# Patient Record
Sex: Male | Born: 1975
Health system: Southern US, Community
[De-identification: ages and names within clinical notes are randomized; demographics above are authoritative.]

## PROBLEM LIST (undated history)

## (undated) DIAGNOSIS — E7800 Pure hypercholesterolemia, unspecified: Secondary | ICD-10-CM

## (undated) DIAGNOSIS — K829 Disease of gallbladder, unspecified: Secondary | ICD-10-CM

## (undated) DIAGNOSIS — R61 Generalized hyperhidrosis: Secondary | ICD-10-CM

## (undated) DIAGNOSIS — K589 Irritable bowel syndrome without diarrhea: Secondary | ICD-10-CM

## (undated) DIAGNOSIS — F1010 Alcohol abuse, uncomplicated: Secondary | ICD-10-CM

## (undated) DIAGNOSIS — R251 Tremor, unspecified: Secondary | ICD-10-CM

## (undated) DIAGNOSIS — F649 Gender identity disorder, unspecified: Secondary | ICD-10-CM

## (undated) DIAGNOSIS — F4010 Social phobia, unspecified: Secondary | ICD-10-CM

## (undated) DIAGNOSIS — F32A Depression, unspecified: Secondary | ICD-10-CM

## (undated) DIAGNOSIS — R000 Tachycardia, unspecified: Secondary | ICD-10-CM

## (undated) DIAGNOSIS — G4730 Sleep apnea, unspecified: Secondary | ICD-10-CM

## (undated) DIAGNOSIS — R400 Somnolence: Secondary | ICD-10-CM

## (undated) DIAGNOSIS — M549 Dorsalgia, unspecified: Secondary | ICD-10-CM

## (undated) DIAGNOSIS — I10 Essential (primary) hypertension: Secondary | ICD-10-CM

## (undated) DIAGNOSIS — E119 Type 2 diabetes mellitus without complications: Secondary | ICD-10-CM

## (undated) DIAGNOSIS — F419 Anxiety disorder, unspecified: Secondary | ICD-10-CM

## (undated) DIAGNOSIS — K5900 Constipation, unspecified: Secondary | ICD-10-CM

## (undated) DIAGNOSIS — M797 Fibromyalgia: Secondary | ICD-10-CM

## (undated) DIAGNOSIS — G9332 Myalgic encephalomyelitis/chronic fatigue syndrome: Secondary | ICD-10-CM

## (undated) DIAGNOSIS — F1990 Other psychoactive substance use, unspecified, uncomplicated: Secondary | ICD-10-CM

## (undated) DIAGNOSIS — J45909 Unspecified asthma, uncomplicated: Secondary | ICD-10-CM

## (undated) DIAGNOSIS — Z9109 Other allergy status, other than to drugs and biological substances: Secondary | ICD-10-CM

## (undated) DIAGNOSIS — R7303 Prediabetes: Secondary | ICD-10-CM

## (undated) DIAGNOSIS — K760 Fatty (change of) liver, not elsewhere classified: Secondary | ICD-10-CM

## (undated) DIAGNOSIS — K219 Gastro-esophageal reflux disease without esophagitis: Secondary | ICD-10-CM

## (undated) DIAGNOSIS — R1310 Dysphagia, unspecified: Secondary | ICD-10-CM

## (undated) DIAGNOSIS — F909 Attention-deficit hyperactivity disorder, unspecified type: Secondary | ICD-10-CM

## (undated) DIAGNOSIS — T7840XA Allergy, unspecified, initial encounter: Secondary | ICD-10-CM

## (undated) HISTORY — DX: Gender identity disorder, unspecified: F64.9

## (undated) HISTORY — DX: Sleep apnea, unspecified: G47.30

## (undated) HISTORY — DX: Dysphagia, unspecified: R13.10

## (undated) HISTORY — DX: Depression, unspecified: F32.A

## (undated) HISTORY — PX: SHOULDER ARTHROSCOPY WITH SUBACROMIAL DECOMPRESSION: SHX5684

## (undated) HISTORY — PX: NASAL SEPTUM SURGERY: SHX37

## (undated) HISTORY — PX: ESOPHAGOGASTRODUODENOSCOPY: SHX1529

## (undated) HISTORY — DX: Fatty (change of) liver, not elsewhere classified: K76.0

## (undated) HISTORY — DX: Pure hypercholesterolemia, unspecified: E78.00

## (undated) HISTORY — DX: Allergy, unspecified, initial encounter: T78.40XA

## (undated) HISTORY — DX: Unspecified asthma, uncomplicated: J45.909

## (undated) HISTORY — PX: TONSILLECTOMY: SUR1361

## (undated) HISTORY — DX: Other allergy status, other than to drugs and biological substances: Z91.09

## (undated) HISTORY — PX: CHOLECYSTECTOMY: SHX55

## (undated) HISTORY — DX: Somnolence: R40.0

## (undated) HISTORY — DX: Tachycardia, unspecified: R00.0

## (undated) HISTORY — DX: Irritable bowel syndrome, unspecified: K58.9

## (undated) HISTORY — DX: Myalgic encephalomyelitis/chronic fatigue syndrome: G93.32

## (undated) HISTORY — DX: Essential (primary) hypertension: I10

## (undated) HISTORY — PX: COLONOSCOPY: SHX174

## (undated) HISTORY — DX: Constipation, unspecified: K59.00

## (undated) HISTORY — DX: Dorsalgia, unspecified: M54.9

## (undated) HISTORY — DX: Alcohol abuse, uncomplicated: F10.10

## (undated) HISTORY — DX: Prediabetes: R73.03

## (undated) HISTORY — DX: Type 2 diabetes mellitus without complications: E11.9

## (undated) HISTORY — DX: Gastro-esophageal reflux disease without esophagitis: K21.9

## (undated) HISTORY — DX: Anxiety disorder, unspecified: F41.9

## (undated) HISTORY — DX: Generalized hyperhidrosis: R61

## (undated) HISTORY — DX: Disease of gallbladder, unspecified: K82.9

## (undated) HISTORY — DX: Social phobia, unspecified: F40.10

## (undated) HISTORY — DX: Fibromyalgia: M79.7

## (undated) HISTORY — DX: Other psychoactive substance use, unspecified, uncomplicated: F19.90

## (undated) HISTORY — DX: Tremor, unspecified: R25.1

## (undated) HISTORY — DX: Attention-deficit hyperactivity disorder, unspecified type: F90.9

## (undated) HISTORY — PX: OTHER SURGICAL HISTORY: SHX169

---
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
## 2008-01-02 DIAGNOSIS — I10 Essential (primary) hypertension: Secondary | ICD-10-CM | POA: Insufficient documentation

## 2012-01-02 DIAGNOSIS — E7800 Pure hypercholesterolemia, unspecified: Secondary | ICD-10-CM | POA: Insufficient documentation

## 2018-05-27 ENCOUNTER — Encounter: Payer: Self-pay | Admitting: *Deleted

## 2018-05-27 ENCOUNTER — Telehealth: Payer: Self-pay | Admitting: *Deleted

## 2018-05-27 NOTE — Telephone Encounter (Signed)
Tried calling pt to update chart prior to in office visit tomorrow. Got busy signal.  I tried calling again. Was able to speak with pt. Updated med list/pharmacy/allergies on file. Explained new check in process.

## 2018-05-28 ENCOUNTER — Ambulatory Visit (INDEPENDENT_AMBULATORY_CARE_PROVIDER_SITE_OTHER): Payer: Medicare Other | Admitting: Neurology

## 2018-05-28 ENCOUNTER — Telehealth: Payer: Self-pay | Admitting: Neurology

## 2018-05-28 ENCOUNTER — Encounter: Payer: Self-pay | Admitting: Neurology

## 2018-05-28 ENCOUNTER — Other Ambulatory Visit: Payer: Self-pay

## 2018-05-28 VITALS — BP 126/85 | HR 92 | Temp 96.6°F | Ht 69.0 in | Wt 324.5 lb

## 2018-05-28 DIAGNOSIS — R251 Tremor, unspecified: Secondary | ICD-10-CM

## 2018-05-28 DIAGNOSIS — M797 Fibromyalgia: Secondary | ICD-10-CM

## 2018-05-28 DIAGNOSIS — F39 Unspecified mood [affective] disorder: Secondary | ICD-10-CM | POA: Insufficient documentation

## 2018-05-28 DIAGNOSIS — F418 Other specified anxiety disorders: Secondary | ICD-10-CM | POA: Diagnosis not present

## 2018-05-28 DIAGNOSIS — R200 Anesthesia of skin: Secondary | ICD-10-CM

## 2018-05-28 NOTE — Telephone Encounter (Signed)
Medicare order sent to GI. No auth they will reach out to the patient to schedule.  

## 2018-05-28 NOTE — Progress Notes (Signed)
[facility]  PATIENT: Gary Sullivan DOB: 1975-08-04  REFERRING DOCTOR OR PCP: Baldo Ash, NP SOURCE: Patient, notes from primary care  _________________________________   HISTORICAL  CHIEF COMPLAINT:  Chief Complaint  Patient presents with  . New Patient (Initial Visit)    RM 12, alone. Paper referral for 20 year hx of numbness/ting of hands and spine. Family hx of MS.    HISTORY OF PRESENT ILLNESS:  I had the pleasure seeing your patient, Gary Sullivan, at [facility] for neurologic consultation regarding his numbness and family history of MS.  Gary Sullivan is a 43 year old man with intermittent numbness and tingling in the right hand and tremors in both hands.   The numbness and dysesthesia are in the hand and wrist but not higher in the right hand.  Every now then he notes milder symptoms in the left hand.  He notes that the sensory symptoms have been intermittent x years but more constant with fluctuating intensification over the past year.    All fingers are involved though the numbness often start in the 2nd and 3rd fingers.  He feels the palm and dorsum are both involved.   Using a mouse or other activity increases the pain.   He switched to a vertical mouse with mild improvement.    It is better with shaking and with the arm supinated with the 5th finger on the surface.     In the past he was diagnosed with carpal tunnel syndrome about 15-16 years ago after a NCV/EMG.      He began to note tremors that started with random twitched in his thumbs.  Tremor is worse using utensils and sometimes with typing on his cell phone.  If holding a plate he holds on with both hands.   He has dropped items.    It comes and goes and there is nothing he does to make it better.    Emotion does not change it.    He denies a FH of tremor.    He was placed on atenolol last week and   He denies any symptoms with his legs.   He denies bladder issues.  He  has occasional neck pain.     He has had surgery in the left shoulder.  He has been diagnosed with fibromyalgia.  He is on duloxetine and amitriptyline with some benefit.   He also has anxiety and depression.    He has OSA and was on CPAP.   He reports being referred to pulmonology.     He has a FH of MS (paternal grandfather).      REVIEW OF SYSTEMS: Constitutional: No fevers, chills, sweats, or change in appetite.   He has insomnia Eyes: No visual changes, double vision, eye pain Ear, nose and throat: No hearing loss, ear pain, nasal congestion, sore throat Cardiovascular: No chest pain, palpitations Respiratory: No shortness of breath at rest or with exertion.   He has OSA and used to use a CPAP (no longer has mask/hose) GastrointestinaI: No nausea, vomiting, diarrhea, abdominal pain, fecal incontinence Genitourinary: No dysuria, urinary retention or frequency.  No nocturia. Musculoskeletal: No neck pain, back pain Integumentary: No rash, pruritus, skin lesions Neurological: as above Psychiatric: He has anxiety and depression Endocrine: No palpitations, diaphoresis, change in appetite, change in weigh or increased thirst Hematologic/Lymphatic: No anemia, purpura, petechiae. Allergic/Immunologic: No itchy/runny eyes, nasal congestion, recent allergic reactions, rashes  ALLERGIES: Allergies  Allergen Reactions  . Sulfa Antibiotics  Pain, upset stomach, vomiting    HOME MEDICATIONS:  Current Outpatient Medications:  .  amitriptyline (ELAVIL) 25 MG tablet, Take 25 mg by mouth at bedtime., Disp: , Rfl:  .  atenolol (TENORMIN) 50 MG tablet, Take 50 mg by mouth daily., Disp: , Rfl:  .  BIOTIN PO, Take 10,000 mcg by mouth daily., Disp: , Rfl:  .  buPROPion (WELLBUTRIN SR) 150 MG 12 hr tablet, Take 150 mg by mouth 2 (two) times daily., Disp: , Rfl:  .  Cholecalciferol (VITAMIN D3 PO), Take 50,000 Units by mouth once a week., Disp: , Rfl:  .  dexlansoprazole (DEXILANT) 60 MG  capsule, Take 60 mg by mouth daily., Disp: , Rfl:  .  dicyclomine (BENTYL) 20 MG tablet, Take 40 mg by mouth 2 (two) times a day., Disp: , Rfl:  .  DIGESTIVE ENZYMES PO, Take 1 Dose by mouth daily. Digestive enzymes/probiotic, Disp: , Rfl:  .  DULoxetine (CYMBALTA) 60 MG capsule, Take 60 mg by mouth daily., Disp: , Rfl:  .  fenofibrate 160 MG tablet, Take 160 mg by mouth daily., Disp: , Rfl:  .  finasteride (PROPECIA) 1 MG tablet, Take 1 mg by mouth daily., Disp: , Rfl:  .  glycopyrrolate (ROBINUL) 1 MG tablet, Take 2 mg by mouth 2 (two) times daily., Disp: , Rfl:  .  lisinopril (ZESTRIL) 20 MG tablet, Take 20 mg by mouth daily., Disp: , Rfl:  .  Multiple Vitamin (MULTIVITAMIN PO), Take 1 tablet by mouth daily., Disp: , Rfl:   PAST MEDICAL HISTORY: Past Medical History:  Diagnosis Date  . Asthma   . Elevated heart rate with elevated blood pressure and diagnosis of hypertension   . Environmental allergies   . Fibromyalgia   . Gender dysphoria   . GERD (gastroesophageal reflux disease)   . High cholesterol   . Hyperhidrosis   . Hypertension   . IBS (irritable bowel syndrome)     PAST SURGICAL HISTORY: Past Surgical History:  Procedure Laterality Date  . CHOLECYSTECTOMY    . labrum repair    . NASAL SEPTUM SURGERY    . SHOULDER ARTHROSCOPY WITH SUBACROMIAL DECOMPRESSION Left   . TONSILLECTOMY      FAMILY HISTORY: History reviewed. No pertinent family history.  SOCIAL HISTORY:  Social History   Socioeconomic History  . Marital status: Unknown    Spouse name: Not on file  . Number of children: Not on file  . Years of education: Not on file  . Highest education level: Not on file  Occupational History  . Not on file  Social Needs  . Financial resource strain: Not on file  . Food insecurity:    Worry: Not on file    Inability: Not on file  . Transportation needs:    Medical: Not on file    Non-medical: Not on file  Tobacco Use  . Smoking status: Former Smoker     Types: Cigarettes  . Smokeless tobacco: Never Used  Substance and Sexual Activity  . Alcohol use: Not Currently    Frequency: Never  . Drug use: Not Currently  . Sexual activity: Not on file  Lifestyle  . Physical activity:    Days per week: Not on file    Minutes per session: Not on file  . Stress: Not on file  Relationships  . Social connections:    Talks on phone: Not on file    Gets together: Not on file    Attends religious service: Not  on file    Active member of club or organization: Not on file    Attends meetings of clubs or organizations: Not on file    Relationship status: Not on file  . Intimate partner violence:    Fear of current or ex partner: Not on file    Emotionally abused: Not on file    Physically abused: Not on file    Forced sexual activity: Not on file  Other Topics Concern  . Not on file  Social History Narrative   Right handed    Caffeine use: daily   Lives with brother, nephew and son     PHYSICAL EXAM  Vitals:   05/28/18 0851  BP: 126/85  Pulse: 92  Temp: (!) 96.6 F (35.9 C)  Weight: (!) 324 lb 8 oz (147.2 kg)  Height: 5\' 9"  (1.753 m)    Body mass index is 47.92 kg/m.   General: The patient is well-developed and well-nourished and in no acute distress  Eyes:  Funduscopic exam shows normal optic discs and retinal vessels.  Neck: The neck is supple, no carotid bruits are noted.  The neck is nontender.  Cardiovascular: The heart has a regular rate and rhythm with a normal S1 and S2. There were no murmurs, gallops or rubs. Lungs are clear to auscultation.  Skin: Extremities are without significant edema.  Neurologic Exam  Mental status: The patient is alert and oriented x 3 at the time of the examination. The patient has apparent normal recent and remote memory, with an apparently normal attention span and concentration ability.   Speech is normal.  Cranial nerves: Extraocular movements are full. Pupils are equal, round, and  reactive to light and accomodation.  Visual fields are full.  Facial symmetry is present. There is good facial sensation to soft touch bilaterally.Facial strength is normal.  Trapezius and sternocleidomastoid strength is normal. No dysarthria is noted.  The tongue is midline, and the patient has symmetric elevation of the soft palate. No obvious hearing deficits are noted.  Motor:  Muscle bulk is normal.   Tone is normal. Strength is  5 / 5 in all 4 extremities except for plus/5 right abductor pollicis brevis (median) strength.     Sensory: He has Tinel signs at both wrists and both elbows, right greater than left.  He has a Phalen sign on the right.  Sensory testing is intact to pinprick, soft touch and vibration sensation except for mild reduced sensation to touch over the hyperthenar eminence and fifth finger on the right.  Coordination: Cerebellar testing reveals good finger-nose-finger and heel-to-shin bilaterally.  Gait and station: Station is normal.   Gait is normal. Tandem gait is normal. Romberg is negative.   Reflexes: Deep tendon reflexes are symmetric and normal bilaterally.   Plantar responses are flexor.    DIAGNOSTIC DATA (LABS, IMAGING, TESTING) - I reviewed patient records, labs, notes, testing and imaging myself where available.   ASSESSMENT AND PLAN  Numbness - Plan: NCV with EMG(electromyography), MR BRAIN W WO CONTRAST  Tremor - Plan: NCV with EMG(electromyography), MR BRAIN W WO CONTRAST  Fibromyalgia  Depression with anxiety   In summary, Mr. Peggye PittRichards is a 43 year old man with numbness in the arms and a family history of MS.  We will check an MRI of the brain to determine if there is any evidence of demyelination, stroke or other abnormality.  Additionally we will check a NCV/EMG to determine if there is any evidence of a median or ulnar  neuropathy or radiculopathy.  Further treatment will depend on the results of the studies.  In the interim, he will obtain a  wrist splint to wear on the right.  His tremor is consistent with benign essential tremor.  The atenolol may be helping him some.  He has just started the medication.  When he comes in for his NCV/EMG I will reassess the tremor.  We will consider the addition of primidone if not any better.  I will see him when he returns for the NCV/EMG study.  He should call sooner if any new or worsening neurologic symptoms.   A. Epimenio Foot, MD, [facility] 05/28/2018, 5:47 PM Certified in Neurology, Clinical Neurophysiology, Sleep Medicine, Pain Medicine and Neuroimaging  [facility] [address] [place], [place] [address] [phone]

## 2018-06-04 DIAGNOSIS — E66813 Obesity, class 3: Secondary | ICD-10-CM | POA: Insufficient documentation

## 2018-06-09 ENCOUNTER — Telehealth: Payer: Self-pay | Admitting: Neurology

## 2018-06-09 NOTE — Telephone Encounter (Signed)
Due to current COVID 19 pandemic, our office is severely reducing in office visits, in order to minimize the risk to our patients and healthcare providers.    Pt understands that although there may be some limitations with this type of visit, we will take all precautions to reduce any security or privacy concerns.  Pt understands that this will be treated like an in office visit and we will file with pt's insurance, and there may be a patient responsible charge related to this service.  Pt's email is [email or web address]. Pt will be using [email or web address] for their virtual visit. Pt understands that the nurse will be calling to go over pt's chart.

## 2018-06-17 ENCOUNTER — Telehealth: Payer: Self-pay | Admitting: *Deleted

## 2018-06-17 NOTE — Telephone Encounter (Signed)
Called pt. Updated med list, pharmacy, allergies on file for virtual visit on 06/19/18 with Dr. Felecia Shelling. Converted appt from [email or web address] to mychart video visit. He did not have email for [email or web address].

## 2018-06-19 ENCOUNTER — Telehealth: Payer: Self-pay | Admitting: *Deleted

## 2018-06-19 ENCOUNTER — Telehealth (INDEPENDENT_AMBULATORY_CARE_PROVIDER_SITE_OTHER): Payer: Medicare Other | Admitting: Neurology

## 2018-06-19 ENCOUNTER — Encounter: Payer: Self-pay | Admitting: Neurology

## 2018-06-19 DIAGNOSIS — G4719 Other hypersomnia: Secondary | ICD-10-CM

## 2018-06-19 DIAGNOSIS — G4733 Obstructive sleep apnea (adult) (pediatric): Secondary | ICD-10-CM | POA: Diagnosis not present

## 2018-06-19 DIAGNOSIS — R200 Anesthesia of skin: Secondary | ICD-10-CM | POA: Diagnosis not present

## 2018-06-19 DIAGNOSIS — M797 Fibromyalgia: Secondary | ICD-10-CM | POA: Diagnosis not present

## 2018-06-19 NOTE — Telephone Encounter (Signed)
Request faxed to [facility] requesting pt PSG, CPAP titiation notes.

## 2018-06-19 NOTE — Progress Notes (Signed)
[facility]  PATIENT: Gary Sullivan DOB: [birth year]  REFERRING DOCTOR OR PCP: Sarajane Jews, NP SOURCE: Patient, notes from primary care  _________________________________   HISTORICAL  CHIEF COMPLAINT:  Chief Complaint  Patient presents with  . Other    OSA  . Numbness   Virtual Visit via Video Note I connected with [name]  on 06/19/18 at 10:30 AM EDT by a video enabled telemedicine application and verified that I am speaking with the correct person.  I discussed the limitations of evaluation and management by telemedicine and the availability of in person appointments. The patient expressed understanding and agreed to proceed.  History of Present Illness: He has a very long history of snoring and was told by his brothers around 15 years ago that he was gasping for air at night.  He also started having excessive daytime sleepiness at that time and would doze off if not active.   He was diagnosed with OSA about 4 years ago after a PSG.   He has CPAP but has not been able to use it.   He had a nasal facemask but believes he needs a FFM  His PSG was done through [facility] or [place], [place]).   He believes he had an AHI = 28/hr.   He no longer has supplies so he stopped.  He has a [employer].   When he was using it, he felt better the next day.   He would often take the mask off and not always put it on in the middle of the night.   He feels his symptoms started about the time his weight was 240 pounds in his mid 20's/   He weighted aboput 270-280 when the pSG was done and has gained 40-50 pounds since then.     Currently, he falls asleep quickly but wakes up multiple times at night and then falls back asleep.   He now is not waking up refreshed.   When he used CPAP the whole night, he did not have much daytime sleepiness.  He had a tonsillectomy and had a deviated septum repair in 2019 (Dr. Gaylyn Cheers  at [facility]).   He lives in [place].   His numbness is doing about the same.  He is scheduled for an NCV/EMG and MRI  EPWORTH SLEEPINESS SCALE  On a scale of 0 - 3 what is the chance of dozing:  Sitting and Reading:   0 Watching TV:    3 Sitting inactive in a public place: 2 Passenger in car for one hour: 3 Lying down to rest in the afternoon: 3 Sitting and talking to someone: 1 Sitting quietly after lunch:  3 In a car, stopped in traffic:  1  Total (out of 24):   16/24    Observations/Objective:   She is a well-developed well-nourished woman in no acute distress.  The head is normocephalic and atraumatic.  Sclera are anicteric.  Visible skin appears normal.  The neck has a good range of motion.  Pharynx was difficult to observe over video..  She is alert and fully oriented with fluent speech and good attention, knowledge and memory.  Extraocular muscles are intact.  Facial strength is normal.  Palatal elevation and tongue protrusion are midline.  She appears to have normal strength in the arms.  Rapid alternating movements and finger-nose-finger are performed well.   Assessment and Plan: 1. Numbness   2. OSA (obstructive sleep apnea)  3. Fibromyalgia   4. Excessive daytime sleepiness     1.   We need to get him back on CPAP therapy for his obstructive sleep apnea.  We are going to try to get the results of his PSG and CPAP titration studies from [place] ([facility] in [place]). 2.   I will see him back for his nerve conduction study at the end of the month and a week later he has an MRI. 3.   Stay active and exercise as tolerated.  Follow Up Instructions: I discussed the assessment and treatment plan with the patient. The patient was provided an opportunity to ask questions and all were answered. The patient agreed with the plan and demonstrated an understanding of the instructions.    The patient was advised to call back or seek an in-person evaluation if the symptoms  worsen or if the condition fails to improve as anticipated.  I provided 25 minutes of non-face-to-face time during this encounter.  ___________________________________________________ FROM 05/28/2018: HISTORY OF PRESENT ILLNESS:  I had the pleasure seeing your patient, Gary Sullivan, at [facility] for neurologic consultation regarding his numbness and family history of MS.  Mr. Culton is a 43 year old man with intermittent numbness and tingling in the right hand and tremors in both hands.   The numbness and dysesthesia are in the hand and wrist but not higher in the right hand.  Every now then he notes milder symptoms in the left hand.  He notes that the sensory symptoms have been intermittent x years but more constant with fluctuating intensification over the past year.    All fingers are involved though the numbness often start in the 2nd and 3rd fingers.  He feels the palm and dorsum are both involved.   Using a mouse or other activity increases the pain.   He switched to a vertical mouse with mild improvement.    It is better with shaking and with the arm supinated with the 5th finger on the surface.     In the past he was diagnosed with carpal tunnel syndrome about 15-16 years ago after a NCV/EMG.      He began to note tremors that started with random twitched in his thumbs.  Tremor is worse using utensils and sometimes with typing on his cell phone.  If holding a plate he holds on with both hands.   He has dropped items.    It comes and goes and there is nothing he does to make it better.    Emotion does not change it.    He denies a FH of tremor.    He was placed on atenolol last week and   He denies any symptoms with his legs.   He denies bladder issues.  He has occasional neck pain.     He has had surgery in the left shoulder.  He has been diagnosed with fibromyalgia.  He is on duloxetine and amitriptyline with some benefit.   He also has anxiety and depression.    He  has OSA and was on CPAP.   He reports being referred to pulmonology.     He has a FH of MS (paternal grandfather).      REVIEW OF SYSTEMS: Constitutional: No fevers, chills, sweats, or change in appetite.   He has insomnia Eyes: No visual changes, double vision, eye pain Ear, nose and throat: No hearing loss, ear pain, nasal congestion, sore throat Cardiovascular: No chest pain, palpitations  Respiratory: No shortness of breath at rest or with exertion.   He has OSA and used to use a CPAP (no longer has mask/hose) GastrointestinaI: No nausea, vomiting, diarrhea, abdominal pain, fecal incontinence Genitourinary: No dysuria, urinary retention or frequency.  No nocturia. Musculoskeletal: No neck pain, back pain Integumentary: No rash, pruritus, skin lesions Neurological: as above Psychiatric: He has anxiety and depression Endocrine: No palpitations, diaphoresis, change in appetite, change in weigh or increased thirst Hematologic/Lymphatic: No anemia, purpura, petechiae. Allergic/Immunologic: No itchy/runny eyes, nasal congestion, recent allergic reactions, rashes  ALLERGIES: Allergies  Allergen Reactions  . Sulfa Antibiotics     Pain, upset stomach, vomiting    HOME MEDICATIONS:  Current Outpatient Medications:  .  amitriptyline (ELAVIL) 25 MG tablet, Take 25 mg by mouth at bedtime., Disp: , Rfl:  .  atenolol (TENORMIN) 50 MG tablet, Take 50 mg by mouth daily., Disp: , Rfl:  .  BIOTIN PO, Take 10,000 mcg by mouth daily., Disp: , Rfl:  .  buPROPion (WELLBUTRIN SR) 150 MG 12 hr tablet, Take 150 mg by mouth 2 (two) times daily., Disp: , Rfl:  .  Cholecalciferol (VITAMIN D3 PO), Take 50,000 Units by mouth once a week., Disp: , Rfl:  .  dexlansoprazole (DEXILANT) 60 MG capsule, Take 60 mg by mouth daily., Disp: , Rfl:  .  dicyclomine (BENTYL) 20 MG tablet, Take 40 mg by mouth 2 (two) times a day., Disp: , Rfl:  .  DIGESTIVE ENZYMES PO, Take 1 Dose by mouth daily. Digestive  enzymes/probiotic, Disp: , Rfl:  .  DULoxetine (CYMBALTA) 60 MG capsule, Take 60 mg by mouth daily., Disp: , Rfl:  .  estradiol (VIVELLE-DOT) 0.1 MG/24HR patch, Place 1 patch onto the skin daily., Disp: , Rfl:  .  fenofibrate 160 MG tablet, Take 160 mg by mouth daily., Disp: , Rfl:  .  finasteride (PROPECIA) 1 MG tablet, Take 1 mg by mouth daily., Disp: , Rfl:  .  glycopyrrolate (ROBINUL) 1 MG tablet, Take 2 mg by mouth 2 (two) times daily., Disp: , Rfl:  .  lisinopril (ZESTRIL) 20 MG tablet, Take 20 mg by mouth daily., Disp: , Rfl:  .  Multiple Vitamin (MULTIVITAMIN PO), Take 1 tablet by mouth daily., Disp: , Rfl:  .  ondansetron (ZOFRAN) 4 MG tablet, Take 4 mg by mouth every 8 (eight) hours as needed for nausea or vomiting., Disp: , Rfl:  .  spironolactone (ALDACTONE) 50 MG tablet, Take 50 mg by mouth 2 (two) times daily., Disp: , Rfl:   PAST MEDICAL HISTORY: Past Medical History:  Diagnosis Date  . Asthma   . Elevated heart rate with elevated blood pressure and diagnosis of hypertension   . Environmental allergies   . Fibromyalgia   . Gender dysphoria   . GERD (gastroesophageal reflux disease)   . High cholesterol   . Hyperhidrosis   . Hypertension   . IBS (irritable bowel syndrome)     PAST SURGICAL HISTORY: Past Surgical History:  Procedure Laterality Date  . CHOLECYSTECTOMY    . labrum repair    . NASAL SEPTUM SURGERY    . SHOULDER ARTHROSCOPY WITH SUBACROMIAL DECOMPRESSION Left   . TONSILLECTOMY      FAMILY HISTORY: No family history on file.  SOCIAL HISTORY:  Social History   Socioeconomic History  . Marital status: Unknown    Spouse name: Not on file  . Number of children: Not on file  . Years of education: Not on file  . Highest  education level: Not on file  Occupational History  . Not on file  Social Needs  . Financial resource strain: Not on file  . Food insecurity    Worry: Not on file    Inability: Not on file  . Transportation needs    Medical:  Not on file    Non-medical: Not on file  Tobacco Use  . Smoking status: Former Smoker    Types: Cigarettes  . Smokeless tobacco: Never Used  Substance and Sexual Activity  . Alcohol use: Not Currently    Frequency: Never  . Drug use: Not Currently  . Sexual activity: Not on file  Lifestyle  . Physical activity    Days per week: Not on file    Minutes per session: Not on file  . Stress: Not on file  Relationships  . Social Musicianconnections    Talks on phone: Not on file    Gets together: Not on file    Attends religious service: Not on file    Active member of club or organization: Not on file    Attends meetings of clubs or organizations: Not on file    Relationship status: Not on file  . Intimate partner violence    Fear of current or ex partner: Not on file    Emotionally abused: Not on file    Physically abused: Not on file    Forced sexual activity: Not on file  Other Topics Concern  . Not on file  Social History Narrative   Right handed    Caffeine use: daily   Lives with brother, nephew and son     PHYSICAL EXAM  There were no vitals filed for this visit.  There is no height or weight on file to calculate BMI.   General: The patient is well-developed and well-nourished and in no acute distress  Eyes:  Funduscopic exam shows normal optic discs and retinal vessels.  Neck: The neck is supple, no carotid bruits are noted.  The neck is nontender.  Cardiovascular: The heart has a regular rate and rhythm with a normal S1 and S2. There were no murmurs, gallops or rubs. Lungs are clear to auscultation.  Skin: Extremities are without significant edema.  Neurologic Exam  Mental status: The patient is alert and oriented x 3 at the time of the examination. The patient has apparent normal recent and remote memory, with an apparently normal attention span and concentration ability.   Speech is normal.  Cranial nerves: Extraocular movements are full. Pupils are equal,  round, and reactive to light and accomodation.  Visual fields are full.  Facial symmetry is present. There is good facial sensation to soft touch bilaterally.Facial strength is normal.  Trapezius and sternocleidomastoid strength is normal. No dysarthria is noted.  The tongue is midline, and the patient has symmetric elevation of the soft palate. No obvious hearing deficits are noted.  Motor:  Muscle bulk is normal.   Tone is normal. Strength is  5 / 5 in all 4 extremities except for plus/5 right abductor pollicis brevis (median) strength.     Sensory: He has Tinel signs at both wrists and both elbows, right greater than left.  He has a Phalen sign on the right.  Sensory testing is intact to pinprick, soft touch and vibration sensation except for mild reduced sensation to touch over the hyperthenar eminence and fifth finger on the right.  Coordination: Cerebellar testing reveals good finger-nose-finger and heel-to-shin bilaterally.  Gait and station: Station is  normal.   Gait is normal. Tandem gait is normal. Romberg is negative.   Reflexes: Deep tendon reflexes are symmetric and normal bilaterally.   Plantar responses are flexor.      Jeri Rawlins A. Epimenio FootSater, MD, [facility],FAAN 06/19/2018, 12:57 PM Certified in Neurology, Clinical Neurophysiology, Sleep Medicine, Pain Medicine and Neuroimaging  [facility] [address], [address] [place], [place] [address] [phone]

## 2018-06-24 ENCOUNTER — Other Ambulatory Visit: Payer: Medicare Other

## 2018-06-30 ENCOUNTER — Other Ambulatory Visit: Payer: Self-pay | Admitting: Neurology

## 2018-06-30 DIAGNOSIS — G4733 Obstructive sleep apnea (adult) (pediatric): Secondary | ICD-10-CM

## 2018-06-30 NOTE — Telephone Encounter (Signed)
Received fax from [facility] stating they have no pulmonology visits on file. Spoke with Dr. Felecia Shelling who ordered home sleep test.   Called pt, LVM for him letting him know we could not obtain copy of previous sleep study and Dr. Felecia Shelling ordered home sleep test. He will be called to get this scheduled. Mentioned that if he can obtain a copy himself and have it sent for previous sleep study we could also go that route.

## 2018-07-01 ENCOUNTER — Encounter: Payer: Medicare Other | Admitting: Neurology

## 2018-07-08 ENCOUNTER — Other Ambulatory Visit: Payer: Medicare Other

## 2018-07-23 ENCOUNTER — Ambulatory Visit (INDEPENDENT_AMBULATORY_CARE_PROVIDER_SITE_OTHER): Payer: Medicare Other | Admitting: Neurology

## 2018-07-23 ENCOUNTER — Other Ambulatory Visit: Payer: Self-pay

## 2018-07-23 ENCOUNTER — Ambulatory Visit
Admission: RE | Admit: 2018-07-23 | Discharge: 2018-07-23 | Disposition: A | Discharge status: Home / Self Care | Payer: Medicare Other | Source: Ambulatory Visit | Attending: Neurology | Admitting: Neurology

## 2018-07-23 DIAGNOSIS — R200 Anesthesia of skin: Secondary | ICD-10-CM | POA: Diagnosis not present

## 2018-07-23 DIAGNOSIS — G4733 Obstructive sleep apnea (adult) (pediatric): Secondary | ICD-10-CM | POA: Diagnosis not present

## 2018-07-23 DIAGNOSIS — R251 Tremor, unspecified: Secondary | ICD-10-CM

## 2018-07-23 MED ORDER — GADOBENATE DIMEGLUMINE 529 MG/ML IV SOLN
20.0000 mL | Freq: Once | INTRAVENOUS | Status: AC | PRN
  Administered 2018-07-23: 20 mL via INTRAVENOUS

## 2018-07-29 ENCOUNTER — Other Ambulatory Visit: Payer: Self-pay

## 2018-07-29 ENCOUNTER — Ambulatory Visit (INDEPENDENT_AMBULATORY_CARE_PROVIDER_SITE_OTHER): Payer: Medicare Other | Admitting: Neurology

## 2018-07-29 DIAGNOSIS — R200 Anesthesia of skin: Secondary | ICD-10-CM | POA: Diagnosis not present

## 2018-07-29 DIAGNOSIS — G5601 Carpal tunnel syndrome, right upper limb: Secondary | ICD-10-CM

## 2018-07-29 DIAGNOSIS — R251 Tremor, unspecified: Secondary | ICD-10-CM | POA: Diagnosis not present

## 2018-07-29 DIAGNOSIS — Z0289 Encounter for other administrative examinations: Secondary | ICD-10-CM

## 2018-07-29 MED ORDER — PRIMIDONE 50 MG PO TABS: 50.0000 mg | ORAL_TABLET | Freq: Two times a day (BID) | ORAL | 5 refills | Status: DC

## 2018-07-29 NOTE — Progress Notes (Signed)
Full Name: Gary Sullivan Gender: Male MRN #: [id number] Date of Birth: 1975-01-22    Visit Date: 07/29/2018 13:44 Age: 43 Years 90 Months Old Examining Physician: Arlice Colt, MD  Referring Physician: Arlice Colt, MD    History: 43 year old with trauma in the hands, left greater than right and pain and numbness in the right hand.  Exam, strength is 4+/5 in the right APB hand muscle and there is reduced sensation to touch over the thenar eminence.  Nerve conduction studies:  The right median motor response is slowed across the wrist.  Amplitude and forearm conduction is normal.  The left median and both ulnar motor responses are normal.  The right sensory response is absent.  The left median and ulnar and radial sensory responses were normal.   Electromyography: Needle EMG of selected muscles of the right arm was performed.  Motor unit morphology and recruitment was normal in all of the muscles tested.  Impression: This NCV/EMG study shows the following: 1.   Moderate right median neuropathy at the wrist (carpal tunnel syndrome). 2.   There was no evidence of a superimposed radiculopathy.  Richard A. Felecia Shelling, MD, PhD, FAAN Certified in Neurology, Clinical Neurophysiology, Sleep Medicine, Pain Medicine and Neuroimaging Director, [place] [facility] [address] [place], [place] [address] [phone]          [facility]    Nerve / Sites Muscle Latency Ref. Amplitude Ref. Rel Amp Segments Distance Velocity Ref. Area    ms ms mV mV %  cm m/s m/s mVms  R Median - APB     Wrist APB 5.6 ?4.4 11.4 ?4.0 100 Wrist - APB 7   42.7     Upper arm APB 9.8  9.3  81.6 Upper arm - Wrist 21 49 ?49 33.9  L Median - APB     Wrist APB 4.2 ?4.4 11.5 ?4.0 100 Wrist - APB 7   44.8     Upper arm APB 8.4  10.3  89.3 Upper arm - Wrist 21 49 ?49 39.0  R Ulnar - ADM     Wrist ADM 2.6 ?3.3 13.1 ?6.0 100 Wrist - ADM  7   43.0     B.Elbow ADM 6.3  12.3  94.1 B.Elbow - Wrist 20 54 ?49 39.7     A.Elbow ADM 8.3  12.0  97.2 A.Elbow - B.Elbow 10 49 ?49 39.6         A.Elbow - Wrist      L Ulnar - ADM     Wrist ADM 2.5 ?3.3 13.7 ?6.0 100 Wrist - ADM 7   42.2     B.Elbow ADM 6.1  12.8  93.6 B.Elbow - Wrist 20 56 ?49 38.8     A.Elbow ADM 7.9  12.5  97.7 A.Elbow - B.Elbow 10 55 ?49 38.7         A.Elbow - Wrist                 SNC    Nerve / Sites Rec. Site Peak Lat Ref.  Amp Ref. Segments Distance    ms ms V V  cm  R Radial - Anatomical snuff box (Forearm)     Forearm Wrist 2.2 ?2.9 16 ?15 Forearm - Wrist 10  L Radial - Anatomical snuff box (Forearm)     Forearm Wrist 2.2 ?2.9 18 ?15 Forearm - Wrist 10  R Median -  Orthodromic (Dig II, Mid palm)     Dig II Wrist NR ?3.4 NR ?10 Dig II - Wrist 13  L Median - Orthodromic (Dig II, Mid palm)     Dig II Wrist 3.4 ?3.4 10 ?10 Dig II - Wrist 13  R Ulnar - Orthodromic, (Dig V, Mid palm)     Dig V Wrist 2.8 ?3.1 5 ?5 Dig V - Wrist 11  L Ulnar - Orthodromic, (Dig V, Mid palm)     Dig V Wrist 2.8 ?3.1 5 ?5 Dig V - Wrist 7411                 F  Wave    Nerve F Lat Ref.   ms ms  R Ulnar - ADM 28.1 ?32.0  L Ulnar - ADM 26.7 ?32.0         EMG full       EMG Summary Table    Spontaneous MUAP Recruitment  Muscle IA Fib PSW Fasc Other Amp Dur. Poly Pattern  R. Deltoid Normal None None None _______ Normal Normal Normal Normal  R. Triceps brachii Normal None None None _______ Normal Normal Normal Normal  R. Biceps brachii Normal None None None _______ Normal Normal Normal Normal  R. Extensor digitorum communis Normal None None None _______ Normal Normal Normal Normal  R. First dorsal interosseous Normal None None None _______ Normal Normal Normal Normal  R. Abductor pollicis brevis Normal None None None _______ Normal Normal Normal Normal

## 2018-08-04 ENCOUNTER — Telehealth: Payer: Self-pay | Admitting: *Deleted

## 2018-08-04 ENCOUNTER — Other Ambulatory Visit: Payer: Self-pay | Admitting: *Deleted

## 2018-08-04 DIAGNOSIS — G4733 Obstructive sleep apnea (adult) (pediatric): Secondary | ICD-10-CM

## 2018-08-04 MED ORDER — PRIMIDONE 50 MG PO TABS: 50.0000 mg | ORAL_TABLET | Freq: Two times a day (BID) | ORAL | 5 refills | Status: DC

## 2018-08-04 NOTE — Telephone Encounter (Signed)
Called and spoke with pt. Advised we will send orders for autopap to [facility] for him. They will contact him to get set up in the next week or so. Gave him their phone number: [phone]. Also set up initial f/u 11/25/18 at 1pm with Dr. Felecia Shelling  Faxed order to [phone]. Received fax confirmation.

## 2018-08-04 NOTE — Progress Notes (Signed)
Patient Information     First Name: Gary Sullivan Last Name: Peggye PittRichards ID: [id number]  Birth Date: Feb 11, 1975 Age: [age] Gender: Male  Referring Provider: Baldo AshMichael Martin, NP BMI: 48.0 (W=324 lb, H=5' 9'')    Epworth:  16/24   Sleep Study Information    Study Date: Jul 24, 2018 S/H/A Version: 5.1.77.7 / 4.1.1528 / 6777  History:     He has a very long history of snoring and was told by his brothers around 15 years ago that he was gasping for air at night.  He also started having excessive daytime sleepiness at that time and would doze off if not active.   He was diagnosed with OSA about 4 years ago after a PSG.   He has CPAP but has not been able to use it.   He had a nasal facemask but believes he needs a FFM   [facility] [place] [facility] [place]   He believes he had an AHI = 28/hr.   He no longer has supplies so he stopped.  He has a [employer] machine.   When he was using it, he felt better the next day.   He would often take the mask off and not always put it on in the middle of the night.  Epworth Sleepiness Scale is 16/24 (moderate excessive daytime sleepiness)   Summary & Diagnosis:     1.  Mild to moderate obstructive sleep apnea with an AHI = 13.6 (RDI = 14.5).   Sleep apnea was moderate                           during REM sleep with a REM AHI = 22.1.  Recommendations:      1.   Recommend CPAP.  We can set up AutoPap with mask of choice 2.   Follow-up with Dr. Epimenio FootSater  Electronically Signed:  Pearletha Sullivan A. Epimenio FootSater, MD, PhD, FAAN Certified in Neurology, Clinical Neurophysiology, Sleep Medicine, Pain Medicine and Neuroimaging Director, [facility]  [facility] [address], [address] [place], [place] [address] [phone]         Sleep Summary    Oxygen Saturation Statistics     Start Study Time: End Study Time: Total Recording  Time:    3:16:01 AM 10:36:57 AM   7 h, 20 min  Total Sleep Time % REM of Sleep Time:  6 h, 2 min  22.9    Mean: 94 Minimum: 74 Maximum: 100  Mean of Desaturations Nadirs (%):   90  Oxygen Desaturation. %:   4-9 10-20 >20 Total  Events Number Total    32  7 80.0 17.5  1 2.5  40 100.0  Oxygen Saturation: <90 <=88 <85 <80 <70  Duration (minutes): Sleep % 3.4 0.9  2.4 0.8  0.7 0.2 0.3 0.1 0.0 0.0     Respiratory Indices      Total Events REM NREM All Night  pRDI:  67  pAHI:  63 ODI:  40  pAHIc:  1  % CSR: 0.0 22.1 22.1 17.7 0.9 12.0 10.9 5.7 0.0 14.5 13.6 8.7 0.2       Pulse Rate Statistics during Sleep (BPM)      Mean: 66 Minimum: 45 Maximum: 96    Indices are calculated using technically valid sleep time of  4 hrs, 37  min.

## 2018-08-31 ENCOUNTER — Encounter: Payer: Self-pay | Admitting: Family Medicine

## 2018-09-01 ENCOUNTER — Ambulatory Visit: Payer: Medicare Other | Admitting: Family Medicine

## 2018-09-04 DIAGNOSIS — E349 Endocrine disorder, unspecified: Secondary | ICD-10-CM | POA: Diagnosis not present

## 2018-09-15 DIAGNOSIS — R69 Illness, unspecified: Secondary | ICD-10-CM | POA: Diagnosis not present

## 2018-09-20 DIAGNOSIS — K589 Irritable bowel syndrome without diarrhea: Secondary | ICD-10-CM | POA: Diagnosis not present

## 2018-09-20 DIAGNOSIS — I10 Essential (primary) hypertension: Secondary | ICD-10-CM | POA: Diagnosis not present

## 2018-09-20 DIAGNOSIS — E119 Type 2 diabetes mellitus without complications: Secondary | ICD-10-CM | POA: Diagnosis not present

## 2018-09-20 DIAGNOSIS — R69 Illness, unspecified: Secondary | ICD-10-CM | POA: Diagnosis not present

## 2018-09-20 DIAGNOSIS — J45909 Unspecified asthma, uncomplicated: Secondary | ICD-10-CM | POA: Diagnosis not present

## 2018-09-20 DIAGNOSIS — Z1331 Encounter for screening for depression: Secondary | ICD-10-CM | POA: Diagnosis not present

## 2018-09-20 DIAGNOSIS — R000 Tachycardia, unspecified: Secondary | ICD-10-CM | POA: Diagnosis not present

## 2018-09-20 DIAGNOSIS — M25552 Pain in left hip: Secondary | ICD-10-CM | POA: Diagnosis not present

## 2018-09-20 DIAGNOSIS — M797 Fibromyalgia: Secondary | ICD-10-CM | POA: Diagnosis not present

## 2018-09-20 DIAGNOSIS — K219 Gastro-esophageal reflux disease without esophagitis: Secondary | ICD-10-CM | POA: Diagnosis not present

## 2018-09-22 DIAGNOSIS — R69 Illness, unspecified: Secondary | ICD-10-CM | POA: Diagnosis not present

## 2018-09-25 DIAGNOSIS — R490 Dysphonia: Secondary | ICD-10-CM | POA: Diagnosis not present

## 2018-09-25 DIAGNOSIS — J383 Other diseases of vocal cords: Secondary | ICD-10-CM | POA: Diagnosis not present

## 2018-09-25 DIAGNOSIS — J370 Chronic laryngitis: Secondary | ICD-10-CM | POA: Diagnosis not present

## 2018-09-28 DIAGNOSIS — Z20828 Contact with and (suspected) exposure to other viral communicable diseases: Secondary | ICD-10-CM | POA: Diagnosis not present

## 2018-09-28 DIAGNOSIS — R6883 Chills (without fever): Secondary | ICD-10-CM | POA: Diagnosis not present

## 2018-09-29 ENCOUNTER — Ambulatory Visit: Payer: Medicare Other | Admitting: Family Medicine

## 2018-09-29 DIAGNOSIS — R69 Illness, unspecified: Secondary | ICD-10-CM | POA: Diagnosis not present

## 2018-09-30 ENCOUNTER — Encounter: Payer: Self-pay | Admitting: Family Medicine

## 2018-09-30 ENCOUNTER — Other Ambulatory Visit: Payer: Self-pay

## 2018-09-30 ENCOUNTER — Ambulatory Visit (INDEPENDENT_AMBULATORY_CARE_PROVIDER_SITE_OTHER): Payer: Medicare HMO | Admitting: Family Medicine

## 2018-09-30 DIAGNOSIS — G5601 Carpal tunnel syndrome, right upper limb: Secondary | ICD-10-CM

## 2018-09-30 NOTE — Progress Notes (Signed)
I saw and examined the patient with Dr. Mayer Masker and agree with assessment and plan as outlined.  Has moderate CTS.  Will have him see Dr. Erlinda Hong for surgical consult today.

## 2018-09-30 NOTE — Progress Notes (Addendum)
Gary Sullivan - 43 y.o. male MRN [id number]  Date of birth: 01/14/1975  Office Visit Note: Visit Date: 09/30/2018 PCP: Baldo Ash, FNP Referred by: Asa Lente, MD  Subjective: Chief Complaint  Patient presents with  . right carpal tunnel syndrome - right-hand dominant  . hand falls asleep   HPI: Gary Sullivan is a 43 y.o. male who comes in today with known carpel tunnel syndrome of right extremity.  He reports that he has had worsening numbness/tingling in right hand over the past year. His hand goes numb frequently. Unable to use right hand to write or do any fine motor movements as he cannot feel his thumb or index finger. This bothers him greatly as he is right handed. He is requesting carpel tunnel release surgery.  Had an EMG with Dr. Epimenio Foot on 7/28 that showed moderate median neruopathy at wrist.   Otherwise per HPI.  Assessment & Plan: Visit Diagnoses:  1. Carpal tunnel syndrome, right upper limb     Plan: referral to Dr. Roda Shutters for discussion of surgical intervention  Meds & Orders: No orders of the defined types were placed in this encounter.  No orders of the defined types were placed in this encounter.   Follow-up: No follow-ups on file.   Procedures: No procedures performed  No notes on file   Clinical History: No specialty comments available.   He reports that he has quit smoking. His smoking use included cigarettes. He has never used smokeless tobacco. No results for input(s): HGBA1C, LABURIC in the last 8760 hours.  Objective:  VS:  HT:    WT:   BMI:     BP:   HR: bpm  TEMP: ( )  RESP:  Physical Exam   Ortho Exam  Right hand/wrist:  Inspection: no abnormality Palpation: TTP in thenar eminence of right hand ROM: full ROM in right wrist Strength: 4/5 OK sign Reduced sensation in thenar eminence Tinels sign positive, phalen postiive    Imaging: No results found.  Past Medical/Family/Surgical/Social History: Medications &  Allergies reviewed per EMR, new medications updated. Patient Active Problem List   Diagnosis Date Noted  . OSA (obstructive sleep apnea) 06/19/2018  . Excessive daytime sleepiness 06/19/2018  . Class 3 severe obesity in adult (HCC) 06/04/2018  . Numbness 05/28/2018  . Tremor 05/28/2018  . Fibromyalgia 05/28/2018  . Depression with anxiety 05/28/2018  . High cholesterol 01/02/2012  . High blood pressure 01/02/2008  . IBS (irritable bowel syndrome) 01/01/2006  . GERD (gastroesophageal reflux disease) 01/01/2005  . Social anxiety disorder 01/02/1988  . Gender dysphoria 01/01/1985  . Asthma 01/01/1982   Past Medical History:  Diagnosis Date  . Asthma   . Elevated heart rate with elevated blood pressure and diagnosis of hypertension   . Environmental allergies   . Fibromyalgia   . Gender dysphoria   . GERD (gastroesophageal reflux disease)   . High cholesterol   . Hyperhidrosis   . Hypertension   . IBS (irritable bowel syndrome)    History reviewed. No pertinent family history. Past Surgical History:  Procedure Laterality Date  . CHOLECYSTECTOMY    . labrum repair    . NASAL SEPTUM SURGERY    . SHOULDER ARTHROSCOPY WITH SUBACROMIAL DECOMPRESSION Left   . TONSILLECTOMY     Social History   Occupational History  . Not on file  Tobacco Use  . Smoking status: Former Smoker    Types: Cigarettes  . Smokeless tobacco: Never Used  Substance and Sexual Activity  .  Alcohol use: Not Currently    Frequency: Never  . Drug use: Not Currently  . Sexual activity: Not on file

## 2018-10-03 ENCOUNTER — Encounter (HOSPITAL_BASED_OUTPATIENT_CLINIC_OR_DEPARTMENT_OTHER): Payer: Self-pay | Admitting: *Deleted

## 2018-10-03 ENCOUNTER — Other Ambulatory Visit: Payer: Self-pay

## 2018-10-04 ENCOUNTER — Other Ambulatory Visit (HOSPITAL_COMMUNITY)
Admission: RE | Admit: 2018-10-04 | Discharge: 2018-10-04 | Disposition: A | Discharge status: Home / Self Care | Payer: Medicare HMO | Source: Ambulatory Visit | Attending: Orthopaedic Surgery | Admitting: Orthopaedic Surgery

## 2018-10-04 DIAGNOSIS — Z01812 Encounter for preprocedural laboratory examination: Secondary | ICD-10-CM | POA: Diagnosis not present

## 2018-10-04 DIAGNOSIS — Z20828 Contact with and (suspected) exposure to other viral communicable diseases: Secondary | ICD-10-CM | POA: Insufficient documentation

## 2018-10-06 LAB — NOVEL CORONAVIRUS, NAA (HOSP ORDER, SEND-OUT TO REF LAB; TAT 18-24 HRS): SARS-CoV-2, NAA: NOT DETECTED

## 2018-10-07 ENCOUNTER — Encounter (HOSPITAL_BASED_OUTPATIENT_CLINIC_OR_DEPARTMENT_OTHER)
Admission: RE | Admit: 2018-10-07 | Discharge: 2018-10-07 | Disposition: A | Discharge status: Home / Self Care | Payer: Medicare HMO | Source: Ambulatory Visit | Attending: Orthopaedic Surgery | Admitting: Orthopaedic Surgery

## 2018-10-07 ENCOUNTER — Telehealth: Payer: Self-pay | Admitting: Neurology

## 2018-10-07 ENCOUNTER — Other Ambulatory Visit: Payer: Self-pay

## 2018-10-07 DIAGNOSIS — M797 Fibromyalgia: Secondary | ICD-10-CM | POA: Diagnosis not present

## 2018-10-07 DIAGNOSIS — Z87891 Personal history of nicotine dependence: Secondary | ICD-10-CM | POA: Diagnosis not present

## 2018-10-07 DIAGNOSIS — I10 Essential (primary) hypertension: Secondary | ICD-10-CM | POA: Diagnosis not present

## 2018-10-07 DIAGNOSIS — G4733 Obstructive sleep apnea (adult) (pediatric): Secondary | ICD-10-CM | POA: Diagnosis not present

## 2018-10-07 DIAGNOSIS — Z882 Allergy status to sulfonamides status: Secondary | ICD-10-CM | POA: Diagnosis not present

## 2018-10-07 DIAGNOSIS — R69 Illness, unspecified: Secondary | ICD-10-CM | POA: Diagnosis not present

## 2018-10-07 DIAGNOSIS — F329 Major depressive disorder, single episode, unspecified: Secondary | ICD-10-CM | POA: Diagnosis not present

## 2018-10-07 DIAGNOSIS — K219 Gastro-esophageal reflux disease without esophagitis: Secondary | ICD-10-CM | POA: Diagnosis not present

## 2018-10-07 DIAGNOSIS — G5601 Carpal tunnel syndrome, right upper limb: Secondary | ICD-10-CM

## 2018-10-07 DIAGNOSIS — K589 Irritable bowel syndrome without diarrhea: Secondary | ICD-10-CM | POA: Diagnosis not present

## 2018-10-07 DIAGNOSIS — Z6841 Body Mass Index (BMI) 40.0 and over, adult: Secondary | ICD-10-CM | POA: Diagnosis not present

## 2018-10-07 DIAGNOSIS — F419 Anxiety disorder, unspecified: Secondary | ICD-10-CM | POA: Diagnosis not present

## 2018-10-07 DIAGNOSIS — E7800 Pure hypercholesterolemia, unspecified: Secondary | ICD-10-CM | POA: Diagnosis not present

## 2018-10-07 DIAGNOSIS — J45909 Unspecified asthma, uncomplicated: Secondary | ICD-10-CM | POA: Diagnosis not present

## 2018-10-07 DIAGNOSIS — Z79899 Other long term (current) drug therapy: Secondary | ICD-10-CM | POA: Diagnosis not present

## 2018-10-07 LAB — BASIC METABOLIC PANEL
Anion gap: 13 (ref 5–15)
BUN: 16 mg/dL (ref 6–20)
CO2: 23 mmol/L (ref 22–32)
Calcium: 8.9 mg/dL (ref 8.9–10.3)
Chloride: 98 mmol/L (ref 98–111)
Creatinine, Ser: 1.27 mg/dL — ABNORMAL HIGH (ref 0.61–1.24)
GFR calc Af Amer: >60 mL/min (ref >60)
GFR calc non Af Amer: >60 mL/min (ref >60)
Glucose, Bld: 88 mg/dL (ref 70–99)
Potassium: 4.7 mmol/L (ref 3.5–5.1)
Sodium: 134 mmol/L — ABNORMAL LOW (ref 135–145)

## 2018-10-07 NOTE — Progress Notes (Signed)

## 2018-10-07 NOTE — Telephone Encounter (Signed)
Elenore Paddy R        Thanks for trusting the care of your patient to our office. We would love to schedule an appointment, however patient was a NO SHOW for their appointment on 09/01/18 with Dr Junius Roads.  At this time we will not reach out to the patient to reschedule.     Clance Boll  Referral Coordinator  [facility]  [phone]     I called and left message a well.

## 2018-10-08 ENCOUNTER — Ambulatory Visit (HOSPITAL_BASED_OUTPATIENT_CLINIC_OR_DEPARTMENT_OTHER)
Admission: RE | Admit: 2018-10-08 | Discharge: 2018-10-08 | Disposition: A | Discharge status: Home / Self Care | Payer: Medicare HMO | Attending: Orthopaedic Surgery | Admitting: Orthopaedic Surgery

## 2018-10-08 ENCOUNTER — Encounter (HOSPITAL_BASED_OUTPATIENT_CLINIC_OR_DEPARTMENT_OTHER): Payer: Self-pay | Admitting: *Deleted

## 2018-10-08 ENCOUNTER — Ambulatory Visit (HOSPITAL_BASED_OUTPATIENT_CLINIC_OR_DEPARTMENT_OTHER): Payer: Medicare HMO | Admitting: Anesthesiology

## 2018-10-08 ENCOUNTER — Encounter (HOSPITAL_BASED_OUTPATIENT_CLINIC_OR_DEPARTMENT_OTHER)
Admission: RE | Disposition: A | Discharge status: Home / Self Care | Payer: Self-pay | Source: Home / Self Care | Attending: Orthopaedic Surgery

## 2018-10-08 ENCOUNTER — Other Ambulatory Visit: Payer: Self-pay

## 2018-10-08 DIAGNOSIS — K589 Irritable bowel syndrome without diarrhea: Secondary | ICD-10-CM | POA: Insufficient documentation

## 2018-10-08 DIAGNOSIS — Z6841 Body Mass Index (BMI) 40.0 and over, adult: Secondary | ICD-10-CM | POA: Insufficient documentation

## 2018-10-08 DIAGNOSIS — K219 Gastro-esophageal reflux disease without esophagitis: Secondary | ICD-10-CM | POA: Diagnosis not present

## 2018-10-08 DIAGNOSIS — G5601 Carpal tunnel syndrome, right upper limb: Secondary | ICD-10-CM | POA: Insufficient documentation

## 2018-10-08 DIAGNOSIS — G4733 Obstructive sleep apnea (adult) (pediatric): Secondary | ICD-10-CM | POA: Diagnosis not present

## 2018-10-08 DIAGNOSIS — M797 Fibromyalgia: Secondary | ICD-10-CM | POA: Diagnosis not present

## 2018-10-08 DIAGNOSIS — F329 Major depressive disorder, single episode, unspecified: Secondary | ICD-10-CM | POA: Insufficient documentation

## 2018-10-08 DIAGNOSIS — E7800 Pure hypercholesterolemia, unspecified: Secondary | ICD-10-CM | POA: Insufficient documentation

## 2018-10-08 DIAGNOSIS — J45909 Unspecified asthma, uncomplicated: Secondary | ICD-10-CM | POA: Insufficient documentation

## 2018-10-08 DIAGNOSIS — R69 Illness, unspecified: Secondary | ICD-10-CM | POA: Diagnosis not present

## 2018-10-08 DIAGNOSIS — Z87891 Personal history of nicotine dependence: Secondary | ICD-10-CM | POA: Insufficient documentation

## 2018-10-08 DIAGNOSIS — I10 Essential (primary) hypertension: Secondary | ICD-10-CM | POA: Diagnosis not present

## 2018-10-08 DIAGNOSIS — J9600 Acute respiratory failure, unspecified whether with hypoxia or hypercapnia: Secondary | ICD-10-CM | POA: Diagnosis not present

## 2018-10-08 DIAGNOSIS — Z882 Allergy status to sulfonamides status: Secondary | ICD-10-CM | POA: Insufficient documentation

## 2018-10-08 DIAGNOSIS — Z79899 Other long term (current) drug therapy: Secondary | ICD-10-CM | POA: Insufficient documentation

## 2018-10-08 DIAGNOSIS — F419 Anxiety disorder, unspecified: Secondary | ICD-10-CM | POA: Insufficient documentation

## 2018-10-08 DIAGNOSIS — M6281 Muscle weakness (generalized): Secondary | ICD-10-CM | POA: Diagnosis not present

## 2018-10-08 HISTORY — PX: CARPAL TUNNEL RELEASE: SHX101

## 2018-10-08 SURGERY — CARPAL TUNNEL RELEASE
Anesthesia: Monitor Anesthesia Care | Site: Wrist | Laterality: Right

## 2018-10-08 MED ORDER — OXYCODONE HCL 5 MG/5ML PO SOLN: 5.0000 mg | Freq: Once | ORAL | Status: DC | PRN

## 2018-10-08 MED ORDER — MEPERIDINE HCL 25 MG/ML IJ SOLN: 6.2500 mg | INTRAMUSCULAR | Status: DC | PRN

## 2018-10-08 MED ORDER — LIDOCAINE HCL (PF) 0.5 % IJ SOLN
INTRAMUSCULAR | Status: DC | PRN
  Administered 2018-10-08: 35 mL via INTRAVENOUS

## 2018-10-08 MED ORDER — BUPIVACAINE HCL (PF) 0.25 % IJ SOLN
INTRAMUSCULAR | Status: DC | PRN
  Administered 2018-10-08: 10 mL

## 2018-10-08 MED ORDER — DEXTROSE 5 % IV SOLN
3.0000 g | INTRAVENOUS | Status: AC
  Administered 2018-10-08: 11:00:00 3 g via INTRAVENOUS

## 2018-10-08 MED ORDER — CHLORHEXIDINE GLUCONATE 4 % EX LIQD: 60.0000 mL | Freq: Once | CUTANEOUS | Status: DC

## 2018-10-08 MED ORDER — HYDROMORPHONE HCL 1 MG/ML IJ SOLN: 0.2500 mg | INTRAMUSCULAR | Status: DC | PRN

## 2018-10-08 MED ORDER — MIDAZOLAM HCL 2 MG/2ML IJ SOLN
INTRAMUSCULAR | Status: DC | PRN
  Administered 2018-10-08: 1 mg via INTRAVENOUS

## 2018-10-08 MED ORDER — LACTATED RINGERS IV SOLN
INTRAVENOUS | Status: DC
  Administered 2018-10-08: 10:00:00 via INTRAVENOUS

## 2018-10-08 MED ORDER — LACTATED RINGERS IV SOLN: INTRAVENOUS | Status: DC

## 2018-10-08 MED ORDER — HYDROCODONE-ACETAMINOPHEN 5-325 MG PO TABS: 1.0000 | ORAL_TABLET | Freq: Three times a day (TID) | ORAL | 0 refills | Status: DC | PRN

## 2018-10-08 MED ORDER — KETOROLAC TROMETHAMINE 30 MG/ML IJ SOLN: 30.0000 mg | Freq: Once | INTRAMUSCULAR | Status: DC | PRN

## 2018-10-08 MED ORDER — PROPOFOL 500 MG/50ML IV EMUL
INTRAVENOUS | Status: DC | PRN
  Administered 2018-10-08: 150 ug/kg/min via INTRAVENOUS

## 2018-10-08 MED ORDER — CEFAZOLIN SODIUM-DEXTROSE 1-4 GM/50ML-% IV SOLN
INTRAVENOUS | Status: AC
  Filled 2018-10-08: qty 50

## 2018-10-08 MED ORDER — MIDAZOLAM HCL 2 MG/2ML IJ SOLN
INTRAMUSCULAR | Status: AC
  Filled 2018-10-08: qty 2

## 2018-10-08 MED ORDER — FENTANYL CITRATE (PF) 100 MCG/2ML IJ SOLN
INTRAMUSCULAR | Status: DC | PRN
  Administered 2018-10-08: 50 ug via INTRAVENOUS

## 2018-10-08 MED ORDER — ONDANSETRON HCL 4 MG PO TABS: 4.0000 mg | ORAL_TABLET | Freq: Three times a day (TID) | ORAL | 0 refills | Status: DC | PRN

## 2018-10-08 MED ORDER — CEFAZOLIN SODIUM-DEXTROSE 2-4 GM/100ML-% IV SOLN
INTRAVENOUS | Status: AC
  Filled 2018-10-08: qty 100

## 2018-10-08 MED ORDER — PROMETHAZINE HCL 25 MG/ML IJ SOLN: 6.2500 mg | INTRAMUSCULAR | Status: DC | PRN

## 2018-10-08 MED ORDER — FENTANYL CITRATE (PF) 100 MCG/2ML IJ SOLN
INTRAMUSCULAR | Status: AC
  Filled 2018-10-08: qty 2

## 2018-10-08 MED ORDER — MIDAZOLAM HCL 2 MG/2ML IJ SOLN: 0.5000 mg | INTRAMUSCULAR | Status: DC | PRN

## 2018-10-08 MED ORDER — 0.9 % SODIUM CHLORIDE (POUR BTL) OPTIME
TOPICAL | Status: DC | PRN
  Administered 2018-10-08: 1000 mL

## 2018-10-08 MED ORDER — PROPOFOL 500 MG/50ML IV EMUL
INTRAVENOUS | Status: AC
  Filled 2018-10-08: qty 50

## 2018-10-08 MED ORDER — FENTANYL CITRATE (PF) 100 MCG/2ML IJ SOLN: 50.0000 ug | INTRAMUSCULAR | Status: DC | PRN

## 2018-10-08 MED ORDER — OXYCODONE HCL 5 MG PO TABS: 5.0000 mg | ORAL_TABLET | Freq: Once | ORAL | Status: DC | PRN

## 2018-10-08 SURGICAL SUPPLY — 50 items
BAND RUBBER #18 3X1/16 STRL (MISCELLANEOUS) ×6 IMPLANT
BLADE MINI RND TIP GREEN BEAV (BLADE) ×3 IMPLANT
BLADE SURG 15 STRL LF DISP TIS (BLADE) ×1 IMPLANT
BLADE SURG 15 STRL SS (BLADE) ×2
BNDG ELASTIC 3X5.8 VLCR STR LF (GAUZE/BANDAGES/DRESSINGS) ×3 IMPLANT
BNDG ESMARK 4X9 LF (GAUZE/BANDAGES/DRESSINGS) ×3 IMPLANT
BNDG PLASTER X FAST 3X3 WHT LF (CAST SUPPLIES) IMPLANT
BRUSH SCRUB EZ PLAIN DRY (MISCELLANEOUS) ×3 IMPLANT
CANISTER SUCT 1200ML W/VALVE (MISCELLANEOUS) ×3 IMPLANT
CORD BIPOLAR FORCEPS 12FT (ELECTRODE) ×3 IMPLANT
COVER BACK TABLE REUSABLE LG (DRAPES) ×3 IMPLANT
COVER MAYO STAND REUSABLE (DRAPES) ×3 IMPLANT
COVER WAND RF STERILE (DRAPES) IMPLANT
CUFF TOURN SGL QUICK 18X4 (TOURNIQUET CUFF) ×3 IMPLANT
DECANTER SPIKE VIAL GLASS SM (MISCELLANEOUS) IMPLANT
DRAPE EXTREMITY T 121X128X90 (DISPOSABLE) ×3 IMPLANT
DRAPE IMP U-DRAPE 54X76 (DRAPES) ×3 IMPLANT
DRAPE SURG 17X23 STRL (DRAPES) ×3 IMPLANT
GAUZE 4X4 16PLY RFD (DISPOSABLE) IMPLANT
GAUZE SPONGE 4X4 12PLY STRL (GAUZE/BANDAGES/DRESSINGS) ×3 IMPLANT
GAUZE XEROFORM 1X8 LF (GAUZE/BANDAGES/DRESSINGS) ×3 IMPLANT
GLOVE BIO SURGEON STRL SZ 6.5 (GLOVE) ×2 IMPLANT
GLOVE BIO SURGEONS STRL SZ 6.5 (GLOVE) ×1
GLOVE BIOGEL PI IND STRL 6.5 (GLOVE) ×1 IMPLANT
GLOVE BIOGEL PI IND STRL 7.0 (GLOVE) ×2 IMPLANT
GLOVE BIOGEL PI INDICATOR 6.5 (GLOVE) ×2
GLOVE BIOGEL PI INDICATOR 7.0 (GLOVE) ×4
GLOVE ECLIPSE 6.5 STRL STRAW (GLOVE) ×3 IMPLANT
GLOVE ECLIPSE 7.0 STRL STRAW (GLOVE) ×3 IMPLANT
GLOVE SKINSENSE NS SZ7.5 (GLOVE) ×2
GLOVE SKINSENSE STRL SZ7.5 (GLOVE) ×1 IMPLANT
GLOVE SURG SYN 7.5  E (GLOVE) ×2
GLOVE SURG SYN 7.5 E (GLOVE) ×1 IMPLANT
GOWN STRL REIN XL XLG (GOWN DISPOSABLE) ×3 IMPLANT
GOWN STRL REUS W/ TWL XL LVL3 (GOWN DISPOSABLE) ×2 IMPLANT
GOWN STRL REUS W/TWL XL LVL3 (GOWN DISPOSABLE) ×4
NEEDLE HYPO 25X1 1.5 SAFETY (NEEDLE) IMPLANT
NS IRRIG 1000ML POUR BTL (IV SOLUTION) ×3 IMPLANT
PACK BASIN DAY SURGERY FS (CUSTOM PROCEDURE TRAY) ×3 IMPLANT
PAD CAST 3X4 CTTN HI CHSV (CAST SUPPLIES) ×1 IMPLANT
PADDING CAST COTTON 3X4 STRL (CAST SUPPLIES) ×2
STOCKINETTE 4X48 STRL (DRAPES) ×3 IMPLANT
SUT ETHILON 3 0 PS 1 (SUTURE) ×3 IMPLANT
SYR BULB 3OZ (MISCELLANEOUS) ×3 IMPLANT
SYR CONTROL 10ML LL (SYRINGE) IMPLANT
TOWEL GREEN STERILE FF (TOWEL DISPOSABLE) ×3 IMPLANT
TRAY DSU PREP LF (CUSTOM PROCEDURE TRAY) ×3 IMPLANT
TUBE CONNECTING 20'X1/4 (TUBING)
TUBE CONNECTING 20X1/4 (TUBING) IMPLANT
UNDERPAD 30X36 HEAVY ABSORB (UNDERPADS AND DIAPERS) ×3 IMPLANT

## 2018-10-08 NOTE — Anesthesia Preprocedure Evaluation (Addendum)
Anesthesia Evaluation  Patient identified by MRN, date of birth, ID band Patient awake    Reviewed: Allergy & Precautions, NPO status , Patient's Chart, lab work & pertinent test results, reviewed documented beta blocker date and time   Airway Mallampati: II  TM Distance: >3 FB Neck ROM: Full    Dental no notable dental hx. (+) Teeth Intact, Dental Advisory Given   Pulmonary asthma , sleep apnea , former smoker,  hasn't used rescue inhaler in 2-3years   OSA- hasn't gotten CPAP yet   Pulmonary exam normal breath sounds clear to auscultation       Cardiovascular hypertension, Pt. on medications and Pt. on home beta blockers negative cardio ROS Normal cardiovascular exam Rhythm:Regular Rate:Normal     Neuro/Psych PSYCHIATRIC DISORDERS Anxiety Depression Gender dysphorianegative neurological ROS     GI/Hepatic Neg liver ROS, GERD  Medicated and Controlled,IBS   Endo/Other  Morbid obesityBMI 43  Renal/GU negative Renal ROS  negative genitourinary   Musculoskeletal  (+) Fibromyalgia -Right carpal tunnel syndrome  Fibromyalgia- duloxetine, amitriptyline   Abdominal Normal abdominal exam  (+) + obese,   Peds negative pediatric ROS (+)  Hematology negative hematology ROS (+)   Anesthesia Other Findings HLD    Reproductive/Obstetrics negative OB ROS                            Anesthesia Physical Anesthesia Plan  ASA: III  Anesthesia Plan: MAC and Bier Block and Bier Block-LIDOCAINE ONLY   Post-op Pain Management:    Induction: Intravenous  PONV Risk Score and Plan: 1 and Propofol infusion, Treatment may vary due to age or medical condition and TIVA  Airway Management Planned: Natural Airway and Nasal Cannula  Additional Equipment: None  Intra-op Plan:   Post-operative Plan:   Informed Consent: I have reviewed the patients History and Physical, chart, labs and discussed the  procedure including the risks, benefits and alternatives for the proposed anesthesia with the patient or authorized representative who has indicated his/her understanding and acceptance.       Plan Discussed with: CRNA  Anesthesia Plan Comments:         Anesthesia Quick Evaluation

## 2018-10-08 NOTE — Transfer of Care (Signed)
Immediate Anesthesia Transfer of Care Note  Patient: Gary Sullivan  Procedure(s) Performed: Procedure(s) (LRB): RIGHT CARPAL TUNNEL RELEASE (Right)  Patient Location: PACU  Anesthesia Type: MAC  Level of Consciousness: awake, alert , oriented and patient cooperative  Airway & Oxygen Therapy: Patient Spontanous Breathing and Patient connected to face mask oxygen  Post-op Assessment: Report given to PACU RN and Post -op Vital signs reviewed and stable  Post vital signs: Reviewed and stable  Complications: No apparent anesthesia complications Last Vitals:  Vitals Value Taken Time  BP 108/54 10/08/18 1148  Temp    Pulse 74 10/08/18 1149  Resp 17 10/08/18 1149  SpO2 95 % 10/08/18 1149  Vitals shown include unvalidated device data.  Last Pain:  Vitals:   10/08/18 0945  TempSrc: Oral  PainSc: 2       Patients Stated Pain Goal: 1 (10/08/18 0945)

## 2018-10-08 NOTE — H&P (Signed)
PREOPERATIVE H&P  Chief Complaint: right carpal tunnel syndrome  HPI: Gary Sullivan is a 43 y.o. adult who presents for surgical treatment of right carpal tunnel syndrome.  She denies any changes in medical history.  Past Medical History:  Diagnosis Date  . Asthma   . Elevated heart rate with elevated blood pressure and diagnosis of hypertension   . Environmental allergies   . Fibromyalgia   . Gender dysphoria   . GERD (gastroesophageal reflux disease)   . High cholesterol   . Hyperhidrosis   . Hypertension   . IBS (irritable bowel syndrome)    Past Surgical History:  Procedure Laterality Date  . CHOLECYSTECTOMY    . labrum repair    . NASAL SEPTUM SURGERY    . SHOULDER ARTHROSCOPY WITH SUBACROMIAL DECOMPRESSION Left   . TONSILLECTOMY     Social History   Socioeconomic History  . Marital status: Unknown    Spouse name: Not on file  . Number of children: Not on file  . Years of education: Not on file  . Highest education level: Not on file  Occupational History  . Not on file  Social Needs  . Financial resource strain: Not on file  . Food insecurity    Worry: Not on file    Inability: Not on file  . Transportation needs    Medical: Not on file    Non-medical: Not on file  Tobacco Use  . Smoking status: Former Smoker    Types: Cigarettes  . Smokeless tobacco: Never Used  Substance and Sexual Activity  . Alcohol use: Not Currently    Frequency: Never  . Drug use: Not Currently  . Sexual activity: Not on file  Lifestyle  . Physical activity    Days per week: Not on file    Minutes per session: Not on file  . Stress: Not on file  Relationships  . Social Musician on phone: Not on file    Gets together: Not on file    Attends religious service: Not on file    Active member of club or organization: Not on file    Attends meetings of clubs or organizations: Not on file    Relationship status: Not on file  Other Topics Concern  . Not on  file  Social History Narrative   Right handed    Caffeine use: daily   Lives with brother, nephew and son   History reviewed. No pertinent family history. Allergies  Allergen Reactions  . Dog Epithelium Allergy Skin Test Itching and Other (See Comments)    Other reaction(s): Other (See Comments)   . Sulfamethoxazole-Trimethoprim Other (See Comments), Diarrhea, Nausea Only and Rash    Other reaction(s): Abdominal Pain   . Sulfa Antibiotics     Pain, upset stomach, vomiting   Prior to Admission medications   Medication Sig Start Date End Date Taking? Authorizing Provider  amitriptyline (ELAVIL) 25 MG tablet Take 25 mg by mouth at bedtime.   Yes [provider]  atenolol (TENORMIN) 50 MG tablet Take 50 mg by mouth daily.   Yes [provider]  buPROPion (WELLBUTRIN SR) 150 MG 12 hr tablet Take 150 mg by mouth 2 (two) times daily.   Yes [provider]  Cholecalciferol (VITAMIN D3 PO) Take 50,000 Units by mouth once a week.   Yes [provider]  cyclobenzaprine (FLEXERIL) 5 MG tablet  09/20/18  Yes [provider]  dexlansoprazole (DEXILANT) 60 MG capsule  Take 60 mg by mouth daily.   Yes [provider]  dicyclomine (BENTYL) 20 MG tablet Take 40 mg by mouth 2 (two) times a day.   Yes [provider]  DIGESTIVE ENZYMES PO Take 1 Dose by mouth daily. Digestive enzymes/probiotic   Yes [provider]  DULoxetine (CYMBALTA) 60 MG capsule Take 60 mg by mouth daily.   Yes [provider]  estradiol (VIVELLE-DOT) 0.1 MG/24HR patch Place 1 patch onto the skin daily.   Yes [provider]  fenofibrate 160 MG tablet Take 160 mg by mouth daily.   Yes [provider]  finasteride (PROPECIA) 1 MG tablet Take 1 mg by mouth daily.   Yes [provider]  glycopyrrolate (ROBINUL) 1 MG tablet Take 2 mg by mouth 2 (two) times daily.   Yes [provider]  lisinopril (ZESTRIL) 20 MG tablet  Take 20 mg by mouth daily.   Yes [provider]  Omega-3 1000 MG CAPS Take by mouth.   Yes [provider]  primidone (MYSOLINE) 50 MG tablet Take 1 tablet (50 mg total) by mouth 2 (two) times daily. 08/04/18  Yes Sater, Nanine Means, MD  Semaglutide,0.25 or 0.5MG /DOS, 2 MG/1.5ML SOPN Inject into the skin. 08/13/18  Yes [provider]  spironolactone (ALDACTONE) 50 MG tablet Take 50 mg by mouth 2 (two) times daily.   Yes [provider]  HYDROcodone-acetaminophen (NORCO) 5-325 MG tablet Take 1-2 tablets by mouth 3 (three) times daily as needed. 10/08/18   Leandrew Koyanagi, MD  ondansetron (ZOFRAN) 4 MG tablet Take 1-2 tablets (4-8 mg total) by mouth every 8 (eight) hours as needed for nausea or vomiting. 10/08/18   Leandrew Koyanagi, MD     Positive ROS: All other systems have been reviewed and were otherwise negative with the exception of those mentioned in the HPI and as above.  Physical Exam: General: Alert, no acute distress Cardiovascular: No pedal edema Respiratory: No cyanosis, no use of accessory musculature GI: abdomen soft Skin: No lesions in the area of chief complaint Neurologic: Sensation intact distally Psychiatric: Patient is competent for consent with normal mood and affect Lymphatic: no lymphedema  MUSCULOSKELETAL: exam stable  Assessment: right carpal tunnel syndrome  Plan: Plan for Procedure(s): RIGHT CARPAL TUNNEL RELEASE  The risks benefits and alternatives were discussed with the patient including but not limited to the risks of nonoperative treatment, versus surgical intervention including infection, bleeding, nerve injury,  blood clots, cardiopulmonary complications, morbidity, mortality, among others, and they were willing to proceed.   Eduard Roux, MD   10/08/2018 11:05 AM

## 2018-10-08 NOTE — Op Note (Signed)
   Carpal tunnel op note  DATE OF SURGERY:10/08/2018  PREOPERATIVE DIAGNOSIS:  Right carpal tunnel syndrome  POSTOPERATIVE DIAGNOSIS: same  PROCEDURE:  Right carpal tunnel release. CPT [id number]  SURGEON: Surgeon(s): Leandrew Koyanagi, MD  ASSIST: Madalyn Rob, PA-C; necessary for the timely completion of procedure and due to complexity of procedure.  ANESTHESIA:  Regional  TOURNIQUET TIME: less than 20 minutes  BLOOD LOSS: Minimal.  COMPLICATIONS: None.  PATHOLOGY: None.  INDICATIONS: The patient is a 43 y.o. -year-old adult who presented with carpal tunnel syndrome failing nonsurgical management, indicated for surgical release.  DESCRIPTION OF PROCEDURE: The patient was identified in the preoperative holding area.  The operative site was marked by the surgeon and confirmed by the patient.  He was brought back to the operating room.  Anesthesia was induced by the anesthesia team.  A well padded nonsterile tourniquet was placed. The operative extremity was prepped and draped in standard sterile fashion.  A timeout was performed.  Preoperative antibiotics were given.   A palmar incision was made about 5 mm ulnar to the thenar crease.  The palmar aponeurosis was exposed and divided in line with the skin incision. The palmaris brevis was visualized and divided.  The distal edge of the transcarpal ligament was identified. A hemostat was inserted into the carpal tunnel to protect the median nerve and the flexor tendons. Then, the transverse carpal ligament was released under direct visualization. Proximally, a subcutaneous tunnel was made allowing a Sewell retractor to be placed. Then, the distal portion of the antebrachial fascia was released. Distally, all fibrous bands were released. The median nerve was visualized, and the fat pad was exposed. Following release, local infiltration with 0.25% of Sensorcaine was given. The tourniquet was deflated. Hemostasis achieved.  Wound was irrigated  and closed with 4-0 nylon sutures. Sterile dressing applied. The patient was transferred to the recovery room in stable condition after all counts were correct.  POSTOPERATIVE PLAN: To start nerve gliding exercises as tolerated and no heavy lifting for four weeks.

## 2018-10-08 NOTE — Discharge Instructions (Signed)
Postoperative instructions:  Weightbearing instructions: no heavy lifting  Dressing instructions: Keep your dressing and/or splint clean and dry at all times.  It will be removed at your first post-operative appointment.  Your stitches and/or staples will be removed at this visit.    Incision instructions:  Do not soak your incision for 3 weeks after surgery.  If the incision gets wet, pat dry and do not scrub the incision.  Pain control:  You have been given a prescription to be taken as directed for post-operative pain control.  In addition, elevate the operative extremity above the heart at all times to prevent swelling and throbbing pain.  Take over-the-counter Colace, 100mg  by mouth twice a day while taking narcotic pain medications to help prevent constipation.  Follow up appointments: 1) 7 days for suture removal and wound check. 2) Dr. Erlinda Hong as scheduled.   -------------------------------------------------------------------------------------------------------------  After Surgery Pain Control:  After your surgery, post-surgical discomfort or pain is likely. This discomfort can last several days to a few weeks. At certain times of the day your discomfort may be more intense.  Did you receive a nerve block?  A nerve block can provide pain relief for one hour to two days after your surgery. As long as the nerve block is working, you will experience little or no sensation in the area the surgeon operated on.  As the nerve block wears off, you will begin to experience pain or discomfort. It is very important that you begin taking your prescribed pain medication before the nerve block fully wears off. Treating your pain at the first sign of the block wearing off will ensure your pain is better controlled and more tolerable when full-sensation returns. Do not wait until the pain is intolerable, as the medicine will be less effective. It is better to treat pain in advance than to try and  catch up.  General Anesthesia:  If you did not receive a nerve block during your surgery, you will need to start taking your pain medication shortly after your surgery and should continue to do so as prescribed by your surgeon.  Pain Medication:  Most commonly we prescribe Vicodin and Percocet for post-operative pain. Both of these medications contain a combination of acetaminophen (Tylenol) and a narcotic to help control pain.   It takes between 30 and 45 minutes before pain medication starts to work. It is important to take your medication before your pain level gets too intense.   Nausea is a common side effect of many pain medications. You will want to eat something before taking your pain medicine to help prevent nausea.   If you are taking a prescription pain medication that contains acetaminophen, we recommend that you do not take additional over the counter acetaminophen (Tylenol).  Other pain relieving options:   Using a cold pack to ice the affected area a few times a day (15 to 20 minutes at a time) can help to relieve pain, reduce swelling and bruising.   Elevation of the affected area can also help to reduce pain and swelling.      Post Anesthesia Home Care Instructions  Activity: Get plenty of rest for the remainder of the day. A responsible individual must stay with you for 24 hours following the procedure.  For the next 24 hours, DO NOT: -Drive a car -Paediatric nurse -Drink alcoholic beverages -Take any medication unless instructed by your physician -Make any legal decisions or sign important papers.  Meals: Start with liquid  foods such as gelatin or soup. Progress to regular foods as tolerated. Avoid greasy, spicy, heavy foods. If nausea and/or vomiting occur, drink only clear liquids until the nausea and/or vomiting subsides. Call your physician if vomiting continues.  Special Instructions/Symptoms: Your throat may feel dry or sore from the anesthesia or the  breathing tube placed in your throat during surgery. If this causes discomfort, gargle with warm salt water. The discomfort should disappear within 24 hours.  If you had a scopolamine patch placed behind your ear for the management of post- operative nausea and/or vomiting:  1. The medication in the patch is effective for 72 hours, after which it should be removed.  Wrap patch in a tissue and discard in the trash. Wash hands thoroughly with soap and water. 2. You may remove the patch earlier than 72 hours if you experience unpleasant side effects which may include dry mouth, dizziness or visual disturbances. 3. Avoid touching the patch. Wash your hands with soap and water after contact with the patch.      Regional Anesthesia Blocks  1. Numbness or the inability to move the "blocked" extremity may last from 3-48 hours after placement. The length of time depends on the medication injected and your individual response to the medication. If the numbness is not going away after 48 hours, call your surgeon.  2. The extremity that is blocked will need to be protected until the numbness is gone and the  Strength has returned. Because you cannot feel it, you will need to take extra care to avoid injury. Because it may be weak, you may have difficulty moving it or using it. You may not know what position it is in without looking at it while the block is in effect.  3. For blocks in the legs and feet, returning to weight bearing and walking needs to be done carefully. You will need to wait until the numbness is entirely gone and the strength has returned. You should be able to move your leg and foot normally before you try and bear weight or walk. You will need someone to be with you when you first try to ensure you do not fall and possibly risk injury.  4. Bruising and tenderness at the needle site are common side effects and will resolve in a few days.  5. Persistent numbness or new problems with  movement should be communicated to the surgeon or the [facility] [phone] [facility] [phone]).

## 2018-10-08 NOTE — Anesthesia Postprocedure Evaluation (Signed)
Anesthesia Post Note  Patient: Gary Sullivan  Procedure(s) Performed: RIGHT CARPAL TUNNEL RELEASE (Right Wrist)     Patient location during evaluation: Phase II Anesthesia Type: MAC and Bier Block Level of consciousness: awake and alert Pain management: pain level controlled Vital Signs Assessment: post-procedure vital signs reviewed and stable Respiratory status: spontaneous breathing, nonlabored ventilation and respiratory function stable Cardiovascular status: blood pressure returned to baseline and stable Postop Assessment: no apparent nausea or vomiting Anesthetic complications: no    Last Vitals:  Vitals:   10/08/18 1215 10/08/18 1247  BP: (!) 112/49 (!) 113/58  Pulse: 69 70  Resp: 14 18  Temp:  36.6 C  SpO2: 96% 99%    Last Pain:  Vitals:   10/08/18 1247  TempSrc:   PainSc: 0-No pain                 Pervis Hocking

## 2018-10-09 ENCOUNTER — Encounter (HOSPITAL_BASED_OUTPATIENT_CLINIC_OR_DEPARTMENT_OTHER): Payer: Self-pay | Admitting: Orthopaedic Surgery

## 2018-10-13 DIAGNOSIS — R69 Illness, unspecified: Secondary | ICD-10-CM | POA: Diagnosis not present

## 2018-10-16 ENCOUNTER — Inpatient Hospital Stay: Payer: Medicare HMO | Admitting: Orthopaedic Surgery

## 2018-10-17 ENCOUNTER — Encounter: Payer: Self-pay | Admitting: Orthopaedic Surgery

## 2018-10-17 ENCOUNTER — Ambulatory Visit (INDEPENDENT_AMBULATORY_CARE_PROVIDER_SITE_OTHER): Payer: Medicare HMO | Admitting: Physician Assistant

## 2018-10-17 ENCOUNTER — Other Ambulatory Visit: Payer: Self-pay

## 2018-10-17 VITALS — Ht 69.0 in | Wt 290.0 lb

## 2018-10-17 DIAGNOSIS — G5601 Carpal tunnel syndrome, right upper limb: Secondary | ICD-10-CM | POA: Diagnosis not present

## 2018-10-17 NOTE — Progress Notes (Signed)
   Post-Op Visit Note   Patient: Gary Sullivan           Date of Birth: 1975-02-13           MRN: [id number] Visit Date: 10/17/2018 PCP: Sarajane Jews, FNP   Assessment & Plan:  Chief Complaint:  Chief Complaint  Patient presents with  . Right Hand - Routine Post Op    Carpal tunnel release DOS 10/08/2018   Visit Diagnoses:  1. Right carpal tunnel syndrome     Plan: Patient is a pleasant 43 year old gentleman who presents to our clinic today 1 week status post right carpal tunnel release, date of surgery 10/08/2018.  He has been doing well.  No fevers or chills.  No numbness or tingling.  Minimal to no pain.  Examination of his right hand reveals a well-healing surgical incision with nylon sutures in place.  No evidence of infection or cellulitis.  Full sensation distally.  At this point, we will clean injury cover the wound.  We will place him in a removable splint.  He will follow-up with [place] in 1 week's time for suture removal.  He has been instructed to not submerge his hand in water or do any heavy lifting for a total of 4 weeks after surgery.    Follow-Up Instructions: Return in about 1 week (around 10/24/2018) for suture removal.   Orders:  No orders of the defined types were placed in this encounter.  No orders of the defined types were placed in this encounter.   Imaging: No new imaging  PMFS History: Patient Active Problem List   Diagnosis Date Noted  . Carpal tunnel syndrome on right 10/07/2018  . OSA (obstructive sleep apnea) 06/19/2018  . Excessive daytime sleepiness 06/19/2018  . Class 3 severe obesity in adult ([place]) 06/04/2018  . Numbness 05/28/2018  . Tremor 05/28/2018  . Fibromyalgia 05/28/2018  . Depression with anxiety 05/28/2018  . High cholesterol 01/02/2012  . High blood pressure 01/02/2008  . IBS (irritable bowel syndrome) 01/01/2006  . GERD (gastroesophageal reflux disease) 01/01/2005  . Social anxiety disorder 01/02/1988  . Gender dysphoria  01/01/1985  . Asthma 01/01/1982   Past Medical History:  Diagnosis Date  . Asthma   . Elevated heart rate with elevated blood pressure and diagnosis of hypertension   . Environmental allergies   . Fibromyalgia   . Gender dysphoria   . GERD (gastroesophageal reflux disease)   . High cholesterol   . Hyperhidrosis   . Hypertension   . IBS (irritable bowel syndrome)     History reviewed. No pertinent family history.  Past Surgical History:  Procedure Laterality Date  . CARPAL TUNNEL RELEASE Right 10/08/2018   Procedure: RIGHT CARPAL TUNNEL RELEASE;  Surgeon: Leandrew Koyanagi, MD;  Location: [place];  Service: Orthopedics;  Laterality: Right;  . CHOLECYSTECTOMY    . labrum repair    . NASAL SEPTUM SURGERY    . SHOULDER ARTHROSCOPY WITH SUBACROMIAL DECOMPRESSION Left   . TONSILLECTOMY     Social History   Occupational History  . Not on file  Tobacco Use  . Smoking status: Former Smoker    Types: Cigarettes  . Smokeless tobacco: Never Used  Substance and Sexual Activity  . Alcohol use: Not Currently    Frequency: Never  . Drug use: Not Currently  . Sexual activity: Not on file

## 2018-10-19 DIAGNOSIS — K219 Gastro-esophageal reflux disease without esophagitis: Secondary | ICD-10-CM | POA: Diagnosis not present

## 2018-10-19 DIAGNOSIS — R000 Tachycardia, unspecified: Secondary | ICD-10-CM | POA: Diagnosis not present

## 2018-10-19 DIAGNOSIS — Z1331 Encounter for screening for depression: Secondary | ICD-10-CM | POA: Diagnosis not present

## 2018-10-19 DIAGNOSIS — M797 Fibromyalgia: Secondary | ICD-10-CM | POA: Diagnosis not present

## 2018-10-19 DIAGNOSIS — R69 Illness, unspecified: Secondary | ICD-10-CM | POA: Diagnosis not present

## 2018-10-19 DIAGNOSIS — R251 Tremor, unspecified: Secondary | ICD-10-CM | POA: Diagnosis not present

## 2018-10-19 DIAGNOSIS — I10 Essential (primary) hypertension: Secondary | ICD-10-CM | POA: Diagnosis not present

## 2018-10-19 DIAGNOSIS — K589 Irritable bowel syndrome without diarrhea: Secondary | ICD-10-CM | POA: Diagnosis not present

## 2018-10-19 DIAGNOSIS — M545 Low back pain: Secondary | ICD-10-CM | POA: Diagnosis not present

## 2018-10-19 DIAGNOSIS — J45909 Unspecified asthma, uncomplicated: Secondary | ICD-10-CM | POA: Diagnosis not present

## 2018-10-20 DIAGNOSIS — R69 Illness, unspecified: Secondary | ICD-10-CM | POA: Diagnosis not present

## 2018-10-25 DIAGNOSIS — E221 Hyperprolactinemia: Secondary | ICD-10-CM | POA: Diagnosis not present

## 2018-10-25 DIAGNOSIS — E119 Type 2 diabetes mellitus without complications: Secondary | ICD-10-CM | POA: Diagnosis not present

## 2018-10-25 DIAGNOSIS — E349 Endocrine disorder, unspecified: Secondary | ICD-10-CM | POA: Diagnosis not present

## 2018-10-25 DIAGNOSIS — N289 Disorder of kidney and ureter, unspecified: Secondary | ICD-10-CM | POA: Diagnosis not present

## 2018-10-25 DIAGNOSIS — Z5181 Encounter for therapeutic drug level monitoring: Secondary | ICD-10-CM | POA: Diagnosis not present

## 2018-10-25 DIAGNOSIS — Z6841 Body Mass Index (BMI) 40.0 and over, adult: Secondary | ICD-10-CM | POA: Diagnosis not present

## 2018-10-25 DIAGNOSIS — E781 Pure hyperglyceridemia: Secondary | ICD-10-CM | POA: Diagnosis not present

## 2018-10-25 DIAGNOSIS — R69 Illness, unspecified: Secondary | ICD-10-CM | POA: Diagnosis not present

## 2018-10-28 ENCOUNTER — Ambulatory Visit: Payer: Medicare HMO | Admitting: Orthopaedic Surgery

## 2018-10-30 ENCOUNTER — Other Ambulatory Visit: Payer: Self-pay

## 2018-10-30 ENCOUNTER — Ambulatory Visit (INDEPENDENT_AMBULATORY_CARE_PROVIDER_SITE_OTHER): Payer: Medicare HMO | Admitting: Physician Assistant

## 2018-10-30 ENCOUNTER — Encounter: Payer: Self-pay | Admitting: Orthopaedic Surgery

## 2018-10-30 DIAGNOSIS — G5601 Carpal tunnel syndrome, right upper limb: Secondary | ICD-10-CM

## 2018-10-30 NOTE — Progress Notes (Signed)
   Post-Op Visit Note   Patient: Gary Sullivan           Date of Birth: [birth year]           MRN: [id number] Visit Date: 10/30/2018 PCP: Sarajane Jews, FNP   Assessment & Plan:  Chief Complaint:  Chief Complaint  Patient presents with  . Right Wrist - Routine Post Op   Visit Diagnoses:  1. Right carpal tunnel syndrome     Plan: Patient is a pleasant 43 year old gentleman who presents our clinic today 3 weeks status post right carpal tunnel release, date of surgery 10/08/2018.  He has been doing well.  No fevers or chills.  Examination of his right wrist reveals a well-healed surgical incision with nylon sutures in place.  Today, nylon sutures were removed and Steri-Strips applied.  He will avoid any heavy lifting or submerging the hand in water for another 2 weeks.  Follow-up with [place] in 4 weeks time for repeat evaluation.  Call with concerns or questions in the meantime.  Follow-Up Instructions: Return in about 4 weeks (around 11/27/2018).   Orders:  No orders of the defined types were placed in this encounter.  No orders of the defined types were placed in this encounter.   Imaging: No new imaging  PMFS History: Patient Active Problem List   Diagnosis Date Noted  . Carpal tunnel syndrome on right 10/07/2018  . OSA (obstructive sleep apnea) 06/19/2018  . Excessive daytime sleepiness 06/19/2018  . Class 3 severe obesity in adult ([place]) 06/04/2018  . Numbness 05/28/2018  . Tremor 05/28/2018  . Fibromyalgia 05/28/2018  . Depression with anxiety 05/28/2018  . High cholesterol 01/02/2012  . High blood pressure 01/02/2008  . IBS (irritable bowel syndrome) 01/01/2006  . GERD (gastroesophageal reflux disease) 01/01/2005  . Social anxiety disorder 01/02/1988  . Gender dysphoria 01/01/1985  . Asthma 01/01/1982   Past Medical History:  Diagnosis Date  . Asthma   . Elevated heart rate with elevated blood pressure and diagnosis of hypertension   . Environmental allergies    . Fibromyalgia   . Gender dysphoria   . GERD (gastroesophageal reflux disease)   . High cholesterol   . Hyperhidrosis   . Hypertension   . IBS (irritable bowel syndrome)     History reviewed. No pertinent family history.  Past Surgical History:  Procedure Laterality Date  . CARPAL TUNNEL RELEASE Right 10/08/2018   Procedure: RIGHT CARPAL TUNNEL RELEASE;  Surgeon: Leandrew Koyanagi, MD;  Location: [place];  Service: Orthopedics;  Laterality: Right;  . CHOLECYSTECTOMY    . labrum repair    . NASAL SEPTUM SURGERY    . SHOULDER ARTHROSCOPY WITH SUBACROMIAL DECOMPRESSION Left   . TONSILLECTOMY     Social History   Occupational History  . Not on file  Tobacco Use  . Smoking status: Former Smoker    Types: Cigarettes  . Smokeless tobacco: Never Used  Substance and Sexual Activity  . Alcohol use: Not Currently    Frequency: Never  . Drug use: Not Currently  . Sexual activity: Not on file

## 2018-11-03 DIAGNOSIS — R69 Illness, unspecified: Secondary | ICD-10-CM | POA: Diagnosis not present

## 2018-11-03 DIAGNOSIS — K219 Gastro-esophageal reflux disease without esophagitis: Secondary | ICD-10-CM | POA: Diagnosis not present

## 2018-11-03 DIAGNOSIS — I10 Essential (primary) hypertension: Secondary | ICD-10-CM | POA: Diagnosis not present

## 2018-11-03 DIAGNOSIS — E119 Type 2 diabetes mellitus without complications: Secondary | ICD-10-CM | POA: Diagnosis not present

## 2018-11-03 DIAGNOSIS — M797 Fibromyalgia: Secondary | ICD-10-CM | POA: Diagnosis not present

## 2018-11-03 DIAGNOSIS — R000 Tachycardia, unspecified: Secondary | ICD-10-CM | POA: Diagnosis not present

## 2018-11-03 DIAGNOSIS — J45909 Unspecified asthma, uncomplicated: Secondary | ICD-10-CM | POA: Diagnosis not present

## 2018-11-03 DIAGNOSIS — R251 Tremor, unspecified: Secondary | ICD-10-CM | POA: Diagnosis not present

## 2018-11-03 DIAGNOSIS — E669 Obesity, unspecified: Secondary | ICD-10-CM | POA: Diagnosis not present

## 2018-11-03 DIAGNOSIS — K589 Irritable bowel syndrome without diarrhea: Secondary | ICD-10-CM | POA: Diagnosis not present

## 2018-11-04 DIAGNOSIS — E119 Type 2 diabetes mellitus without complications: Secondary | ICD-10-CM | POA: Diagnosis not present

## 2018-11-04 DIAGNOSIS — E221 Hyperprolactinemia: Secondary | ICD-10-CM | POA: Diagnosis not present

## 2018-11-04 DIAGNOSIS — R69 Illness, unspecified: Secondary | ICD-10-CM | POA: Diagnosis not present

## 2018-11-04 DIAGNOSIS — E349 Endocrine disorder, unspecified: Secondary | ICD-10-CM | POA: Diagnosis not present

## 2018-11-04 DIAGNOSIS — E781 Pure hyperglyceridemia: Secondary | ICD-10-CM | POA: Diagnosis not present

## 2018-11-04 DIAGNOSIS — Z5181 Encounter for therapeutic drug level monitoring: Secondary | ICD-10-CM | POA: Diagnosis not present

## 2018-11-05 ENCOUNTER — Ambulatory Visit: Payer: Medicare HMO | Admitting: Orthopaedic Surgery

## 2018-11-07 DIAGNOSIS — R69 Illness, unspecified: Secondary | ICD-10-CM | POA: Diagnosis not present

## 2018-11-13 DIAGNOSIS — R69 Illness, unspecified: Secondary | ICD-10-CM | POA: Diagnosis not present

## 2018-11-17 DIAGNOSIS — R69 Illness, unspecified: Secondary | ICD-10-CM | POA: Diagnosis not present

## 2018-11-24 DIAGNOSIS — R69 Illness, unspecified: Secondary | ICD-10-CM | POA: Diagnosis not present

## 2018-11-25 ENCOUNTER — Encounter: Payer: Self-pay | Admitting: Neurology

## 2018-11-25 ENCOUNTER — Ambulatory Visit: Payer: Medicare Other | Admitting: Neurology

## 2018-11-26 ENCOUNTER — Ambulatory Visit: Payer: Medicare HMO | Admitting: Orthopaedic Surgery

## 2018-12-04 DIAGNOSIS — R69 Illness, unspecified: Secondary | ICD-10-CM | POA: Diagnosis not present

## 2018-12-06 DIAGNOSIS — E785 Hyperlipidemia, unspecified: Secondary | ICD-10-CM | POA: Diagnosis not present

## 2018-12-06 DIAGNOSIS — I471 Supraventricular tachycardia: Secondary | ICD-10-CM | POA: Diagnosis not present

## 2018-12-06 DIAGNOSIS — K219 Gastro-esophageal reflux disease without esophagitis: Secondary | ICD-10-CM | POA: Diagnosis not present

## 2018-12-06 DIAGNOSIS — I10 Essential (primary) hypertension: Secondary | ICD-10-CM | POA: Diagnosis not present

## 2018-12-06 DIAGNOSIS — K589 Irritable bowel syndrome without diarrhea: Secondary | ICD-10-CM | POA: Diagnosis not present

## 2018-12-06 DIAGNOSIS — G8929 Other chronic pain: Secondary | ICD-10-CM | POA: Diagnosis not present

## 2018-12-06 DIAGNOSIS — L659 Nonscarring hair loss, unspecified: Secondary | ICD-10-CM | POA: Diagnosis not present

## 2018-12-06 DIAGNOSIS — R69 Illness, unspecified: Secondary | ICD-10-CM | POA: Diagnosis not present

## 2018-12-06 DIAGNOSIS — M797 Fibromyalgia: Secondary | ICD-10-CM | POA: Diagnosis not present

## 2018-12-09 ENCOUNTER — Ambulatory Visit: Payer: Self-pay

## 2018-12-09 ENCOUNTER — Other Ambulatory Visit: Payer: Self-pay

## 2018-12-09 ENCOUNTER — Ambulatory Visit (INDEPENDENT_AMBULATORY_CARE_PROVIDER_SITE_OTHER): Payer: Medicare HMO | Admitting: Orthopaedic Surgery

## 2018-12-09 ENCOUNTER — Encounter: Payer: Self-pay | Admitting: Orthopaedic Surgery

## 2018-12-09 VITALS — Ht 69.0 in | Wt 290.0 lb

## 2018-12-09 DIAGNOSIS — G5601 Carpal tunnel syndrome, right upper limb: Secondary | ICD-10-CM

## 2018-12-09 DIAGNOSIS — M545 Low back pain: Secondary | ICD-10-CM | POA: Diagnosis not present

## 2018-12-09 DIAGNOSIS — G8929 Other chronic pain: Secondary | ICD-10-CM

## 2018-12-09 MED ORDER — PREDNISONE 10 MG (21) PO TBPK: ORAL_TABLET | ORAL | 0 refills | Status: DC

## 2018-12-09 MED ORDER — MELOXICAM 7.5 MG PO TABS: 7.5000 mg | ORAL_TABLET | Freq: Every day | ORAL | 2 refills | Status: DC | PRN

## 2018-12-09 MED ORDER — METHOCARBAMOL 500 MG PO TABS: 500.0000 mg | ORAL_TABLET | Freq: Two times a day (BID) | ORAL | 0 refills | Status: DC | PRN

## 2018-12-09 NOTE — Progress Notes (Signed)
Office Visit Note   Patient: Gary Sullivan           Date of Birth: 02-21-1975           MRN: [id number] Visit Date: 12/09/2018              Requested by: Sarajane Jews, [address],  [place] [id number] PCP: Sarajane Jews, FNP   Assessment & Plan: Visit Diagnoses:  1. Chronic left-sided low back pain, unspecified whether sciatica present   2. Right carpal tunnel syndrome     Plan: Impression is status post right carpal tunnel release #2 chronic left-sided lower back pain and left lower extremity radiculopathy.  In regards to the carpal tunnel release he is doing well.  He will advance with activity as tolerated.  In regards to the lower back, we will start him on a Sterapred taper muscle relaxer.  An internal prescription for physical therapy was also sent in.  He will follow-up with [place] in 6 to 8 weeks if his symptoms have not improved.  Otherwise, follow-up as needed.  Follow-Up Instructions: Return if symptoms worsen or fail to improve.   Orders:  Orders Placed This Encounter  Procedures  . XR Lumbar Spine 2-3 Views  . Ambulatory referral to Physical Therapy   Meds ordered this encounter  Medications  . predniSONE (STERAPRED UNI-PAK 21 TAB) 10 MG (21) TBPK tablet    Sig: Take as directed    Dispense:  21 tablet    Refill:  0  . methocarbamol (ROBAXIN) 500 MG tablet    Sig: Take 1 tablet (500 mg total) by mouth 2 (two) times daily as needed.    Dispense:  20 tablet    Refill:  0  . meloxicam (MOBIC) 7.5 MG tablet    Sig: Take 1 tablet (7.5 mg total) by mouth daily as needed for up to 14 doses for pain.    Dispense:  30 tablet    Refill:  2      Procedures: No procedures performed   Clinical Data: No additional findings.   Subjective: Chief Complaint  Patient presents with  . Right Wrist - Follow-up    Right carpal tunnel release DOS 10/08/2018  . Lower Back - Pain    HPI patient is a pleasant 43 year old he presents our clinic  today 8 weeks status post right carpal tunnel release 10/08/2018 and new onset left lower back pain.  In regards to his right wrist, he has been doing well.  He denies any numbness, tingling or burning.  He does note occasional hypersensitivity when he bumps the incisional area.  In regards to his left lower back, he has had pain here for the past 6 months and has progressively worsened.  No specific injury.  He does note that he started working out at the gym prior to the onset of pain.  There is no specific exercise though he does that worsens his symptoms.  The pain he has is to the left lower back and radiates down the lateral left leg and stops at the knee.  He notes tingling and numbness to the left lateral leg.  He is also noticed increased weakness to the left leg while working out.  The symptoms are aggravated while doing cardio at the gym such as walking on the treadmill.  He also has increased pain when sitting or lying down.  He has tried Aleve with minimal relief of symptoms.  No bowel or  bladder change and no saddle paresthesias.  No previous lumbar history.  Review of Systems as detailed in HPI.  All others reviewed and are negative.   Objective: Vital Signs: Ht 5\' 9"  (1.753 m)   Wt 290 lb (131.5 kg)   BMI 42.83 kg/m   Physical Exam well-developed well-nourished gentleman in no acute distress.  Alert and oriented x3.  Ortho Exam examination of the left lower back reveals moderate left-sided paraspinous tenderness.  No spinous tenderness.  He has increased pain with lumbar flexion and rotation to the right.  Positive straight leg raise on the left.  No focal weakness.  He is neurovascularly intact distally.  Specialty Comments:  No specialty comments available.  Imaging: Xr Lumbar Spine 2-3 Views  Result Date: 12/09/2018 Diffuse spondylosis throughout lumbar spine.    PMFS History: Patient Active Problem List   Diagnosis Date Noted  . Carpal tunnel syndrome on right  10/07/2018  . OSA (obstructive sleep apnea) 06/19/2018  . Excessive daytime sleepiness 06/19/2018  . Class 3 severe obesity in adult ([facility]) 06/04/2018  . Numbness 05/28/2018  . Tremor 05/28/2018  . Fibromyalgia 05/28/2018  . Depression with anxiety 05/28/2018  . High cholesterol 01/02/2012  . High blood pressure 01/02/2008  . IBS (irritable bowel syndrome) 01/01/2006  . GERD (gastroesophageal reflux disease) 01/01/2005  . Social anxiety disorder 01/02/1988  . Gender dysphoria 01/01/1985  . Asthma 01/01/1982   Past Medical History:  Diagnosis Date  . Asthma   . Elevated heart rate with elevated blood pressure and diagnosis of hypertension   . Environmental allergies   . Fibromyalgia   . Gender dysphoria   . GERD (gastroesophageal reflux disease)   . High cholesterol   . Hyperhidrosis   . Hypertension   . IBS (irritable bowel syndrome)     History reviewed. No pertinent family history.  Past Surgical History:  Procedure Laterality Date  . CARPAL TUNNEL RELEASE Right 10/08/2018   Procedure: RIGHT CARPAL TUNNEL RELEASE;  Surgeon: 12/08/2018, MD;  Location: [facility];  Service: Orthopedics;  Laterality: Right;  . CHOLECYSTECTOMY    . labrum repair    . NASAL SEPTUM SURGERY    . SHOULDER ARTHROSCOPY WITH SUBACROMIAL DECOMPRESSION Left   . TONSILLECTOMY     Social History   Occupational History  . Not on file  Tobacco Use  . Smoking status: Former Smoker    Types: Cigarettes  . Smokeless tobacco: Never Used  Substance and Sexual Activity  . Alcohol use: Not Currently    Frequency: Never  . Drug use: Not Currently  . Sexual activity: Not on file

## 2018-12-15 DIAGNOSIS — R69 Illness, unspecified: Secondary | ICD-10-CM | POA: Diagnosis not present

## 2018-12-23 DIAGNOSIS — R69 Illness, unspecified: Secondary | ICD-10-CM | POA: Diagnosis not present

## 2018-12-29 DIAGNOSIS — R69 Illness, unspecified: Secondary | ICD-10-CM | POA: Diagnosis not present

## 2018-12-30 DIAGNOSIS — J0140 Acute pansinusitis, unspecified: Secondary | ICD-10-CM | POA: Diagnosis not present

## 2018-12-30 DIAGNOSIS — U071 COVID-19: Secondary | ICD-10-CM | POA: Diagnosis not present

## 2019-01-20 DIAGNOSIS — R69 Illness, unspecified: Secondary | ICD-10-CM | POA: Diagnosis not present

## 2019-01-29 DIAGNOSIS — R69 Illness, unspecified: Secondary | ICD-10-CM | POA: Diagnosis not present

## 2019-02-03 ENCOUNTER — Ambulatory Visit: Payer: Medicare HMO | Admitting: Orthopaedic Surgery

## 2019-02-04 DIAGNOSIS — R69 Illness, unspecified: Secondary | ICD-10-CM | POA: Diagnosis not present

## 2019-02-08 DIAGNOSIS — M545 Low back pain: Secondary | ICD-10-CM | POA: Diagnosis not present

## 2019-02-08 DIAGNOSIS — J45909 Unspecified asthma, uncomplicated: Secondary | ICD-10-CM | POA: Diagnosis not present

## 2019-02-08 DIAGNOSIS — K589 Irritable bowel syndrome without diarrhea: Secondary | ICD-10-CM | POA: Diagnosis not present

## 2019-02-08 DIAGNOSIS — M797 Fibromyalgia: Secondary | ICD-10-CM | POA: Diagnosis not present

## 2019-02-08 DIAGNOSIS — R251 Tremor, unspecified: Secondary | ICD-10-CM | POA: Diagnosis not present

## 2019-02-08 DIAGNOSIS — R000 Tachycardia, unspecified: Secondary | ICD-10-CM | POA: Diagnosis not present

## 2019-02-08 DIAGNOSIS — I10 Essential (primary) hypertension: Secondary | ICD-10-CM | POA: Diagnosis not present

## 2019-02-08 DIAGNOSIS — R69 Illness, unspecified: Secondary | ICD-10-CM | POA: Diagnosis not present

## 2019-02-08 DIAGNOSIS — K219 Gastro-esophageal reflux disease without esophagitis: Secondary | ICD-10-CM | POA: Diagnosis not present

## 2019-02-09 DIAGNOSIS — R69 Illness, unspecified: Secondary | ICD-10-CM | POA: Diagnosis not present

## 2019-02-16 DIAGNOSIS — R69 Illness, unspecified: Secondary | ICD-10-CM | POA: Diagnosis not present

## 2019-02-19 ENCOUNTER — Other Ambulatory Visit: Payer: Self-pay | Admitting: Neurology

## 2019-02-23 DIAGNOSIS — R69 Illness, unspecified: Secondary | ICD-10-CM | POA: Diagnosis not present

## 2019-03-16 DIAGNOSIS — R69 Illness, unspecified: Secondary | ICD-10-CM | POA: Diagnosis not present

## 2019-03-23 DIAGNOSIS — R69 Illness, unspecified: Secondary | ICD-10-CM | POA: Diagnosis not present

## 2019-04-09 DIAGNOSIS — R69 Illness, unspecified: Secondary | ICD-10-CM | POA: Diagnosis not present

## 2019-04-13 DIAGNOSIS — R69 Illness, unspecified: Secondary | ICD-10-CM | POA: Diagnosis not present

## 2019-04-21 ENCOUNTER — Other Ambulatory Visit: Payer: Self-pay

## 2019-04-21 ENCOUNTER — Ambulatory Visit (INDEPENDENT_AMBULATORY_CARE_PROVIDER_SITE_OTHER): Payer: Medicare HMO | Admitting: Osteopathic Medicine

## 2019-04-21 ENCOUNTER — Encounter: Payer: Self-pay | Admitting: Osteopathic Medicine

## 2019-04-21 VITALS — BP 105/70 | HR 86 | Temp 98.1°F | Wt 288.1 lb

## 2019-04-21 DIAGNOSIS — F418 Other specified anxiety disorders: Secondary | ICD-10-CM

## 2019-04-21 DIAGNOSIS — R251 Tremor, unspecified: Secondary | ICD-10-CM | POA: Diagnosis not present

## 2019-04-21 DIAGNOSIS — E7800 Pure hypercholesterolemia, unspecified: Secondary | ICD-10-CM

## 2019-04-21 DIAGNOSIS — R69 Illness, unspecified: Secondary | ICD-10-CM | POA: Diagnosis not present

## 2019-04-21 DIAGNOSIS — F640 Transsexualism: Secondary | ICD-10-CM | POA: Diagnosis not present

## 2019-04-21 DIAGNOSIS — Z789 Other specified health status: Secondary | ICD-10-CM

## 2019-04-21 HISTORY — DX: Other specified health status: Z78.9

## 2019-04-21 MED ORDER — VITAMIN D (ERGOCALCIFEROL) 1.25 MG (50000 UNIT) PO CAPS: 50000.0000 [IU] | ORAL_CAPSULE | ORAL | 0 refills | Status: DC

## 2019-04-21 MED ORDER — RIFAXIMIN 200 MG PO TABS: 200.0000 mg | ORAL_TABLET | Freq: Three times a day (TID) | ORAL | 0 refills | Status: AC

## 2019-04-21 MED ORDER — BUPROPION HCL ER (XL) 450 MG PO TB24: 450.0000 mg | ORAL_TABLET | ORAL | 1 refills | Status: DC

## 2019-04-21 NOTE — Patient Instructions (Signed)
Try Xifaxan for IBS Increase Wellbutrin from 300 to 450 mg  I'll double check mammogram question I'll look into Ketamine / psychiatry referral

## 2019-04-21 NOTE — Progress Notes (Signed)
HPI: Gary Sullivan is a 44 y.o. adult who  has a past medical history of Asthma, Elevated heart rate with elevated blood pressure and diagnosis of hypertension, Environmental allergies, Fibromyalgia, Gender dysphoria, GERD (gastroesophageal reflux disease), High cholesterol, Hyperhidrosis, Hypertension, IBS (irritable bowel syndrome), and Transgender (04/21/2019).  she presents to [facility] today, 04/21/19,  for chief complaint of: New to establish care  Transgender person, on gender affirming therapy with estrogen, finasteride, spironolactone  History of depression/anxiety, relatively stable on Cymbalta, Elavil, Wellbutrin but still significantly low moods.  Has questions about whether ketamine might be an option.  Has tried and failed multiple first and second line treatments for major depressive disorder.  Irritable bowel syndrome with diarrhea.    Past medical, surgical, social and family history reviewed:  Patient Active Problem List   Diagnosis Date Noted  . Transgender 04/21/2019  . Carpal tunnel syndrome on right 10/07/2018  . OSA (obstructive sleep apnea) 06/19/2018  . Excessive daytime sleepiness 06/19/2018  . Class 3 severe obesity in adult (HCC) 06/04/2018  . Numbness 05/28/2018  . Tremor 05/28/2018  . Fibromyalgia 05/28/2018  . Depression with anxiety 05/28/2018  . High cholesterol 01/02/2012  . High blood pressure 01/02/2008  . IBS (irritable bowel syndrome) 01/01/2006  . GERD (gastroesophageal reflux disease) 01/01/2005  . Social anxiety disorder 01/02/1988  . Gender dysphoria 01/01/1985  . Asthma 01/01/1982    Past Surgical History:  Procedure Laterality Date  . CARPAL TUNNEL RELEASE Right 10/08/2018   Procedure: RIGHT CARPAL TUNNEL RELEASE;  Surgeon: Tarry Kos, MD;  Location: [facility];  Service: Orthopedics;  Laterality: Right;  . CHOLECYSTECTOMY    . labrum repair    . NASAL SEPTUM SURGERY    .  SHOULDER ARTHROSCOPY WITH SUBACROMIAL DECOMPRESSION Left   . TONSILLECTOMY      Social History   Tobacco Use  . Smoking status: Former Smoker    Types: Cigarettes  . Smokeless tobacco: Never Used  Substance Use Topics  . Alcohol use: Not Currently    Family History  Problem Relation Age of Onset  . High blood pressure Mother   . High blood pressure Father   . High blood pressure Brother   . Pancreatic cancer Maternal Grandfather   . Lung cancer Paternal Grandmother   . Diabetes Paternal Grandfather      Current medication list and allergy/intolerance information reviewed:    Current Outpatient Medications  Medication Sig Dispense Refill  . amitriptyline (ELAVIL) 25 MG tablet Take 25 mg by mouth at bedtime.    Marland Kitchen atenolol (TENORMIN) 50 MG tablet Take 50 mg by mouth daily.    . Cholecalciferol (VITAMIN D3 PO) Take 50,000 Units by mouth once a week.    . cyclobenzaprine (FLEXERIL) 5 MG tablet     . dexlansoprazole (DEXILANT) 60 MG capsule Take 60 mg by mouth daily.    Marland Kitchen dicyclomine (BENTYL) 20 MG tablet Take 40 mg by mouth 2 (two) times a day.    Marland Kitchen DIGESTIVE ENZYMES PO Take 1 Dose by mouth daily. Digestive enzymes/probiotic    . DULoxetine (CYMBALTA) 60 MG capsule Take 60 mg by mouth daily.    Marland Kitchen estradiol (VIVELLE-DOT) 0.1 MG/24HR patch Place 1 patch onto the skin daily.    . fenofibrate 160 MG tablet Take 160 mg by mouth daily.    . finasteride (PROPECIA) 1 MG tablet Take 1 mg by mouth daily.    Marland Kitchen glycopyrrolate (ROBINUL) 1 MG tablet Take 2  mg by mouth 2 (two) times daily.    Marland Kitchen lisinopril (ZESTRIL) 20 MG tablet Take 20 mg by mouth daily.    . meloxicam (MOBIC) 7.5 MG tablet Take 1 tablet (7.5 mg total) by mouth daily as needed for up to 14 doses for pain. 30 tablet 2  . methocarbamol (ROBAXIN) 500 MG tablet Take 1 tablet (500 mg total) by mouth 2 (two) times daily as needed. 20 tablet 0  . Omega-3 1000 MG CAPS Take by mouth.    . ondansetron (ZOFRAN) 4 MG tablet Take 1-2  tablets (4-8 mg total) by mouth every 8 (eight) hours as needed for nausea or vomiting. 40 tablet 0  . primidone (MYSOLINE) 50 MG tablet Take 1 tablet (50 mg total) by mouth 2 (two) times daily. 60 tablet 5  . Semaglutide,0.25 or 0.5MG /DOS, 2 MG/1.5ML SOPN Inject into the skin.    Marland Kitchen spironolactone (ALDACTONE) 50 MG tablet Take 50 mg by mouth 2 (two) times daily.    Marland Kitchen buPROPion 450 MG TB24 Take 450 mg by mouth every morning. 90 tablet 1  . rifaximin (XIFAXAN) 200 MG tablet Take 1 tablet (200 mg total) by mouth 3 (three) times daily for 14 days. 42 tablet 0  . Vitamin D, Ergocalciferol, (DRISDOL) 1.25 MG (50000 UNIT) CAPS capsule Take 1 capsule (50,000 Units total) by mouth every 7 (seven) days. Take for 8 total doses(weeks) 8 capsule 0   No current facility-administered medications for this visit.    Allergies  Allergen Reactions  . Molds & Smuts Cough, Hives, Itching and Shortness Of Breath  . Dog Epithelium Allergy Skin Test Itching and Other (See Comments)    Other reaction(s): Other (See Comments)   . Sulfamethoxazole-Trimethoprim Other (See Comments), Diarrhea, Nausea Only and Rash    Other reaction(s): Abdominal Pain   . Sulfa Antibiotics     Pain, upset stomach, vomiting  . Cat Hair Extract Hives, Itching and Rash      Review of Systems:  Constitutional:  No  fever, no chills, No recent illness, No unintentional weight changes. +significant fatigue.   HEENT: No  headache, no vision change, no hearing change, No sore throat, No  sinus pressure  Cardiac: No  chest pain, No  pressure, No palpitations, No  Orthopnea  Respiratory:  No  shortness of breath. No  Cough  Gastrointestinal: +abdominal pain, No  nausea, No  vomiting,  No  blood in stool, +diarrhea, No  constipation   Musculoskeletal: No new myalgia/arthralgia  Skin: No  Rash, No other wounds/concerning lesions  Genitourinary: No  incontinence, No  abnormal genital bleeding, No abnormal genital  discharge  Hem/Onc: No  easy bruising/bleeding, No  abnormal lymph node  Endocrine: No cold intolerance,  No heat intolerance. No polyuria/polydipsia/polyphagia   Neurologic: No  weakness, No  dizziness  Psychiatric: +concerns with depression, +concerns with anxiety, No sleep problems, No mood problems  Exam:  BP 105/70 (BP Location: Left Arm, Patient Position: Sitting, Cuff Size: Large)   Pulse 86   Temp 98.1 F (36.7 C) (Oral)   Wt 288 lb 1.3 oz (130.7 kg)   BMI 42.54 kg/m   Constitutional: VS see above. General Appearance: alert, well-developed, well-nourished, NAD  Eyes: Normal lids and conjunctive, non-icteric sclera  Ears, Nose, Mouth, Throat: TM normal bilaterally.   Neck: No masses, trachea midline. No thyroid enlargement. No tenderness/mass appreciated. No lymphadenopathy  Respiratory: Normal respiratory effort. no wheeze, no rhonchi, no rales  Cardiovascular: S1/S2 normal, no murmur, no rub/gallop auscultated.  RRR. No lower extremity edema  Gastrointestinal: Nontender, no masses. No hepatomegaly, no splenomegaly. No hernia appreciated. Bowel sounds normal. Rectal exam deferred.   Musculoskeletal: Gait normal. No clubbing/cyanosis of digits.   Neurological: Normal balance/coordination. No tremor.   Skin: warm, dry, intact. No rash/ulcer. No concerning nevi or subq nodules on limited exam.    Psychiatric: Normal judgment/insight. Normal mood and affect. Oriented x3.    Results for orders placed or performed in visit on 04/21/19 (from the past 72 hour(s))  CBC     Status: Abnormal   Collection Time: 04/22/19  2:53 PM  Result Value Ref Range   WBC 6.1 3.8 - 10.8 Thousand/uL   RBC 4.00 (L) 4.20 - 5.80 Million/uL   Hemoglobin 12.3 (L) 13.2 - 17.1 g/dL   HCT 63.8 (L) 75.6 - 43.3 %   MCV 90.5 80.0 - 100.0 fL   MCH 30.8 27.0 - 33.0 pg   MCHC 34.0 32.0 - 36.0 g/dL   RDW 29.5 18.8 - 41.6 %   Platelets 373 140 - 400 Thousand/uL   MPV 8.8 7.5 - 12.5 fL  COMPLETE  METABOLIC PANEL WITH GFR     Status: Abnormal   Collection Time: 04/22/19  2:53 PM  Result Value Ref Range   Glucose, Bld 97 65 - 99 mg/dL    Comment: .            Fasting reference interval .    BUN 19 7 - 25 mg/dL   Creat 6.06 3.01 - 6.01 mg/dL   GFR, Est Non African American 66 > OR = 60 mL/min/1.76m2   GFR, Est African American 77 > OR = 60 mL/min/1.5m2   BUN/Creatinine Ratio NOT APPLICABLE 6 - 22 (calc)   Sodium 138 135 - 146 mmol/L   Potassium 4.9 3.5 - 5.3 mmol/L   Chloride 102 98 - 110 mmol/L   CO2 30 20 - 32 mmol/L   Calcium 9.3 8.6 - 10.3 mg/dL   Total Protein 6.6 6.1 - 8.1 g/dL   Albumin 4.1 3.6 - 5.1 g/dL   Globulin 2.5 1.9 - 3.7 g/dL (calc)   AG Ratio 1.6 1.0 - 2.5 (calc)   Total Bilirubin 0.4 0.2 - 1.2 mg/dL   Alkaline phosphatase (APISO) 34 (L) 36 - 130 U/L   AST 16 10 - 40 U/L   ALT 11 9 - 46 U/L  Lipid panel     Status: Abnormal   Collection Time: 04/22/19  2:53 PM  Result Value Ref Range   Cholesterol 188 <200 mg/dL   HDL 35 (L) > OR = 40 mg/dL   Triglycerides 093 <235 mg/dL   LDL Cholesterol (Calc) 127 (H) mg/dL (calc)    Comment: Reference range: <100 . Desirable range <100 mg/dL for primary prevention;   <70 mg/dL for patients with CHD or diabetic patients  with > or = 2 CHD risk factors. Marland Kitchen LDL-C is now calculated using the Martin-Hopkins  calculation, which is a validated novel method providing  better accuracy than the Friedewald equation in the  estimation of LDL-C.  Horald Pollen et al. Lenox Ahr. 5732;202(54): 2061-2068  (http://education.[facility].com/faq/FAQ164)    Total CHOL/HDL Ratio 5.4 (H) <5.0 (calc)   Non-HDL Cholesterol (Calc) 153 (H) <130 mg/dL (calc)    Comment: For patients with diabetes plus 1 major ASCVD risk  factor, treating to a non-HDL-C goal of <100 mg/dL  (LDL-C of <27 mg/dL) is considered a therapeutic  option.   Hemoglobin A1c     Status: None  Collection Time: 04/22/19  2:53 PM  Result Value Ref Range   Hgb A1c  MFr Bld 5.4 <5.7 % of total Hgb    Comment: For the purpose of screening for the presence of diabetes: . <5.7%       Consistent with the absence of diabetes 5.7-6.4%    Consistent with increased risk for diabetes             (prediabetes) > or =6.5%  Consistent with diabetes . This assay result is consistent with a decreased risk of diabetes. . Currently, no consensus exists regarding use of hemoglobin A1c for diagnosis of diabetes in children. . According to American Diabetes Association (ADA) guidelines, hemoglobin A1c <7.0% represents optimal control in non-pregnant diabetic patients. Different metrics may apply to specific patient populations.  Standards of Medical Care in Diabetes(ADA). .    Mean Plasma Glucose 108 (calc)   eAG (mmol/L) 6.0 (calc)  Testosterone     Status: Abnormal   Collection Time: 04/22/19  2:53 PM  Result Value Ref Range   Testosterone 10 (L) 250 - 827 ng/dL    Comment: In hypogonadal males, Testosterone, Total, LC/MS/MS, is the recommended assay due to the diminished accuracy of immunoassay at levels below 250 ng/dL. This test code [phone]) must be collected in a red-top tube with no gel.    Estradiol     Status: Abnormal   Collection Time: 04/22/19  2:53 PM  Result Value Ref Range   Estradiol 85 (H) < OR = 39 pg/mL    Comment: Reference range established on post-pubertal patient population. No pre-pubertal reference range established using this assay. For any patients for whom low Estradiol levels are anticipated (e.g. males, pre-pubertal children and hypogonadal/post-menopausal  females), the Murphy Oil Estradiol, Ultrasensitive, LCMSMS assay is recommended (order code [id number]). . Please note: patients being treated with the drug  fulvestrant (Faslodex(R)) have demonstrated significant  interference in immunoassay methods for estradiol  measurement. The cross reactivity could lead to falsely  elevated estradiol test  results leading to an  inappropriate clinical assessment of estrogen status. [facility] order code [id number]-Estradiol,  Ultrasensitive LC/MS/MS demonstrates negligible cross  reactivity with fulvestrant.     No results found.   ASSESSMENT/PLAN: The primary encounter diagnosis was Depression with anxiety. Diagnoses of Transgender, Tremor, and High cholesterol were also pertinent to this visit.   Orders Placed This Encounter  Procedures  . CBC  . COMPLETE METABOLIC PANEL WITH GFR  . Lipid panel  . Hemoglobin A1c  . Testosterone  . Estradiol    Meds ordered this encounter  Medications  . Vitamin D, Ergocalciferol, (DRISDOL) 1.25 MG (50000 UNIT) CAPS capsule    Sig: Take 1 capsule (50,000 Units total) by mouth every 7 (seven) days. Take for 8 total doses(weeks)    Dispense:  8 capsule    Refill:  0  . buPROPion 450 MG TB24    Sig: Take 450 mg by mouth every morning.    Dispense:  90 tablet    Refill:  1  . rifaximin (XIFAXAN) 200 MG tablet    Sig: Take 1 tablet (200 mg total) by mouth 3 (three) times daily for 14 days.    Dispense:  42 tablet    Refill:  0    Patient Instructions  Try Xifaxan for IBS Increase Wellbutrin from 300 to 450 mg  I'll double check mammogram question I'll look into Ketamine / psychiatry referral  Visit summary with medication list and pertinent instructions was printed for patient to review. All questions at time of visit were answered - patient instructed to contact office with any additional concerns or updates. ER/RTC precautions were reviewed with the patient.   Note: Total time spent 45 minutes, greater than 50% of the visit was spent face-to-face counseling and coordinating care for the above diagnoses listed in assessment/plan.   Please note: voice recognition software was used to produce this document, and typos may escape review. Please contact Dr. Lyn HollingsheadAlexander for any needed clarifications.     Follow-up plan:  Return for RECHECK PENDING RESULTS / IF WORSE OR CHANGE.

## 2019-04-22 ENCOUNTER — Telehealth: Payer: Self-pay | Admitting: Osteopathic Medicine

## 2019-04-22 DIAGNOSIS — E349 Endocrine disorder, unspecified: Secondary | ICD-10-CM | POA: Diagnosis not present

## 2019-04-22 DIAGNOSIS — N289 Disorder of kidney and ureter, unspecified: Secondary | ICD-10-CM | POA: Diagnosis not present

## 2019-04-22 DIAGNOSIS — R251 Tremor, unspecified: Secondary | ICD-10-CM | POA: Diagnosis not present

## 2019-04-22 DIAGNOSIS — E221 Hyperprolactinemia: Secondary | ICD-10-CM | POA: Diagnosis not present

## 2019-04-22 DIAGNOSIS — E119 Type 2 diabetes mellitus without complications: Secondary | ICD-10-CM | POA: Diagnosis not present

## 2019-04-22 DIAGNOSIS — Z87898 Personal history of other specified conditions: Secondary | ICD-10-CM | POA: Diagnosis not present

## 2019-04-22 DIAGNOSIS — E781 Pure hyperglyceridemia: Secondary | ICD-10-CM | POA: Diagnosis not present

## 2019-04-22 DIAGNOSIS — R69 Illness, unspecified: Secondary | ICD-10-CM | POA: Diagnosis not present

## 2019-04-22 DIAGNOSIS — E7800 Pure hypercholesterolemia, unspecified: Secondary | ICD-10-CM | POA: Diagnosis not present

## 2019-04-22 DIAGNOSIS — Z5181 Encounter for therapeutic drug level monitoring: Secondary | ICD-10-CM | POA: Diagnosis not present

## 2019-04-22 NOTE — Telephone Encounter (Signed)
Patient stopped by stating that the medication buPROPion 450 MG TB24 prescribed needs a PRIOR AUTH.   and the medication rifaximin (XIFAXAN) 200 MG tablet is not covered under insurance at all, so needs to see if something else can be called in that is covered.   Also, stated that the Vitamin D ordered is not covered under insurance either and will order that one online. AM

## 2019-04-23 ENCOUNTER — Telehealth: Payer: Self-pay | Admitting: Osteopathic Medicine

## 2019-04-23 LAB — CBC
HCT: 36.2 % — ABNORMAL LOW (ref 38.5–50.0)
Hemoglobin: 12.3 g/dL — ABNORMAL LOW (ref 13.2–17.1)
MCH: 30.8 pg (ref 27.0–33.0)
MCHC: 34 g/dL (ref 32.0–36.0)
MCV: 90.5 fL (ref 80.0–100.0)
MPV: 8.8 fL (ref 7.5–12.5)
Platelets: 373 10*3/uL (ref 140–400)
RBC: 4 10*6/uL — ABNORMAL LOW (ref 4.20–5.80)
RDW: 12.4 % (ref 11.0–15.0)
WBC: 6.1 10*3/uL (ref 3.8–10.8)

## 2019-04-23 LAB — COMPLETE METABOLIC PANEL WITH GFR
AG Ratio: 1.6 (calc) (ref 1.0–2.5)
ALT: 11 U/L (ref 9–46)
AST: 16 U/L (ref 10–40)
Albumin: 4.1 g/dL (ref 3.6–5.1)
Alkaline phosphatase (APISO): 34 U/L — ABNORMAL LOW (ref 36–130)
BUN: 19 mg/dL (ref 7–25)
CO2: 30 mmol/L (ref 20–32)
Calcium: 9.3 mg/dL (ref 8.6–10.3)
Chloride: 102 mmol/L (ref 98–110)
Creat: 1.32 mg/dL (ref 0.60–1.35)
GFR, Est African American: 77 mL/min/{1.73_m2} (ref >=60)
GFR, Est Non African American: 66 mL/min/{1.73_m2} (ref >=60)
Globulin: 2.5 g/dL (calc) (ref 1.9–3.7)
Glucose, Bld: 97 mg/dL (ref 65–99)
Potassium: 4.9 mmol/L (ref 3.5–5.3)
Sodium: 138 mmol/L (ref 135–146)
Total Bilirubin: 0.4 mg/dL (ref 0.2–1.2)
Total Protein: 6.6 g/dL (ref 6.1–8.1)

## 2019-04-23 LAB — LIPID PANEL
Cholesterol: 188 mg/dL (ref <200)
HDL: 35 mg/dL — ABNORMAL LOW (ref >=40)
LDL Cholesterol (Calc): 127 mg/dL (calc) — ABNORMAL HIGH
Non-HDL Cholesterol (Calc): 153 mg/dL (calc) — ABNORMAL HIGH (ref <130)
Total CHOL/HDL Ratio: 5.4 (calc) — ABNORMAL HIGH (ref <5.0)
Triglycerides: 145 mg/dL (ref <150)

## 2019-04-23 LAB — HEMOGLOBIN A1C
Hgb A1c MFr Bld: 5.4 % of total Hgb (ref <5.7)
Mean Plasma Glucose: 108 (calc)
eAG (mmol/L): 6 (calc)

## 2019-04-23 LAB — TESTOSTERONE: Testosterone: 10 ng/dL — ABNORMAL LOW (ref 250–827)

## 2019-04-23 LAB — ESTRADIOL: Estradiol: 85 pg/mL — ABNORMAL HIGH (ref <=39)

## 2019-04-23 NOTE — Telephone Encounter (Signed)
Received fax from [facility] and Bupropion is approved from 01/02/19 - 01/01/20. - CF

## 2019-04-23 NOTE — Telephone Encounter (Signed)
Received fax for Bupropion XL sent through cover my meds waiting on determination. - CF

## 2019-04-23 NOTE — Telephone Encounter (Signed)
Received fax for PA on Xifaxan sent through cover my meds waiting on determination. - CF

## 2019-04-23 NOTE — Telephone Encounter (Signed)
Received fax from [place] and they approved coverage on Xifaxan from 01/02/19 - 01/01/20. - Cf

## 2019-04-23 NOTE — Telephone Encounter (Signed)
Both Medications have been approved - CF

## 2019-04-24 ENCOUNTER — Encounter: Payer: Self-pay | Admitting: Osteopathic Medicine

## 2019-04-29 DIAGNOSIS — R69 Illness, unspecified: Secondary | ICD-10-CM | POA: Diagnosis not present

## 2019-05-06 DIAGNOSIS — R69 Illness, unspecified: Secondary | ICD-10-CM | POA: Diagnosis not present

## 2019-05-13 DIAGNOSIS — R69 Illness, unspecified: Secondary | ICD-10-CM | POA: Diagnosis not present

## 2019-05-14 ENCOUNTER — Encounter: Payer: Self-pay | Admitting: Osteopathic Medicine

## 2019-05-19 DIAGNOSIS — R69 Illness, unspecified: Secondary | ICD-10-CM | POA: Diagnosis not present

## 2019-05-21 ENCOUNTER — Encounter: Payer: Self-pay | Admitting: Osteopathic Medicine

## 2019-05-21 MED ORDER — DICYCLOMINE HCL 20 MG PO TABS: 40.0000 mg | ORAL_TABLET | Freq: Two times a day (BID) | ORAL | 3 refills | Status: DC | PRN

## 2019-05-21 MED ORDER — ATENOLOL 50 MG PO TABS: 50.0000 mg | ORAL_TABLET | Freq: Every day | ORAL | 3 refills | Status: DC

## 2019-05-21 NOTE — Telephone Encounter (Signed)
Routing to provider. Rxs written by historical provider. Rxs pended.

## 2019-05-26 ENCOUNTER — Encounter: Payer: Self-pay | Admitting: Osteopathic Medicine

## 2019-05-26 DIAGNOSIS — R69 Illness, unspecified: Secondary | ICD-10-CM | POA: Diagnosis not present

## 2019-05-26 NOTE — Telephone Encounter (Signed)
Routing to provider. Written by historical provider. Rx pended.

## 2019-05-26 NOTE — Telephone Encounter (Signed)
Gary Sullivan is out of office and this referral was done on 4/23... can you look into this since it is for depression?  Thanks.

## 2019-05-27 NOTE — Telephone Encounter (Signed)
Arline Asp states it was sent to the wrong place when the Dr. Lenna Gilford this referral so she has re routed it to the correct place

## 2019-05-28 ENCOUNTER — Telehealth: Payer: Self-pay

## 2019-05-28 DIAGNOSIS — Z87898 Personal history of other specified conditions: Secondary | ICD-10-CM

## 2019-05-28 MED ORDER — DEXILANT 60 MG PO CPDR: 60.0000 mg | DELAYED_RELEASE_CAPSULE | Freq: Every day | ORAL | 3 refills | Status: DC

## 2019-05-28 NOTE — Telephone Encounter (Signed)
Billing trailer for HgbA1c.

## 2019-06-02 ENCOUNTER — Ambulatory Visit (INDEPENDENT_AMBULATORY_CARE_PROVIDER_SITE_OTHER): Payer: Medicare HMO | Admitting: Osteopathic Medicine

## 2019-06-02 ENCOUNTER — Encounter: Payer: Self-pay | Admitting: Osteopathic Medicine

## 2019-06-02 ENCOUNTER — Telehealth: Payer: Self-pay | Admitting: Osteopathic Medicine

## 2019-06-02 VITALS — BP 104/73 | HR 89 | Temp 98.0°F | Wt 284.0 lb

## 2019-06-02 DIAGNOSIS — F640 Transsexualism: Secondary | ICD-10-CM

## 2019-06-02 DIAGNOSIS — R69 Illness, unspecified: Secondary | ICD-10-CM | POA: Diagnosis not present

## 2019-06-02 DIAGNOSIS — Z789 Other specified health status: Secondary | ICD-10-CM

## 2019-06-02 DIAGNOSIS — F418 Other specified anxiety disorders: Secondary | ICD-10-CM | POA: Diagnosis not present

## 2019-06-02 MED ORDER — PRIMIDONE 50 MG PO TABS: 50.0000 mg | ORAL_TABLET | Freq: Two times a day (BID) | ORAL | 3 refills | Status: DC

## 2019-06-02 MED ORDER — GLYCOPYRROLATE 1 MG PO TABS: 2.0000 mg | ORAL_TABLET | Freq: Two times a day (BID) | ORAL | 3 refills | Status: DC

## 2019-06-02 MED ORDER — CYCLOBENZAPRINE HCL 5 MG PO TABS: 5.0000 mg | ORAL_TABLET | Freq: Three times a day (TID) | ORAL | 1 refills | Status: DC | PRN

## 2019-06-02 MED ORDER — SEMAGLUTIDE(0.25 OR 0.5MG/DOS) 2 MG/1.5ML ~~LOC~~ SOPN: 0.5000 mg | PEN_INJECTOR | SUBCUTANEOUS | 5 refills | Status: DC

## 2019-06-02 MED ORDER — VIBERZI 75 MG PO TABS: 75.0000 mg | ORAL_TABLET | Freq: Two times a day (BID) | ORAL | 2 refills | Status: DC

## 2019-06-02 MED ORDER — SPIRONOLACTONE 50 MG PO TABS: 50.0000 mg | ORAL_TABLET | Freq: Two times a day (BID) | ORAL | 3 refills | Status: DC

## 2019-06-02 MED ORDER — METHOCARBAMOL 500 MG PO TABS: 500.0000 mg | ORAL_TABLET | Freq: Two times a day (BID) | ORAL | 0 refills | Status: DC | PRN

## 2019-06-02 MED ORDER — MELOXICAM 7.5 MG PO TABS: 7.5000 mg | ORAL_TABLET | Freq: Every day | ORAL | 1 refills | Status: DC | PRN

## 2019-06-02 MED ORDER — DEPO-ESTRADIOL 5 MG/ML IM OIL: 4.0000 mg | TOPICAL_OIL | INTRAMUSCULAR | 1 refills | Status: DC

## 2019-06-02 NOTE — Progress Notes (Signed)
Gary Sullivan is a 44 y.o. adult who presents to  [facility]  today, 06/04/19, seeking care for the following: . Hormone therapy      ASSESSMENT & PLAN with other pertinent history/findings:  The primary encounter diagnosis was Transgender. A diagnosis of Depression with anxiety was also pertinent to this visit.  Most recent [facility] records reviewed 11/04/2018 Rx at that time for Estradiol Valerate 10 mg SubQ weekly, Spironolactone 50 mg daily. Previously on patch, starting June 2020. Intentional weight loss over last few years. Was on Lisinopril 20 mg, down to 5 mg d/t hypotension    BP Readings from Last 3 Encounters:  06/02/19 104/73  04/21/19 105/70  10/08/18 (!) 113/58   Wt Readings from Last 3 Encounters:  06/02/19 284 lb (128.8 kg)  04/21/19 288 lb 1.3 oz (130.7 kg)  12/09/18 290 lb (131.5 kg)      Estradiol cypionate 2.5 mg weekly however volume still 0.5 mL weekly.  Taking 0.8 mL weekly Spiro 50 mg bid  Bicalutamide 50 mg daily   Pre-DM  Ozempic       Patient Instructions   Psychiatry [phone]   IBS Viberzi  2017 UpToDate Characteristics and sources of common FODMAPs  Word that corresponds to letter in acronym Compounds in this category Foods that contain these compounds  F Fermentable  O Oligosaccharides Fructans, galacto-oligosaccharides Wheat, barley, rye, onion, leek, white part of spring onion, garlic, shallots, artichokes, beetroot, fennel, peas, chicory, pistachio, cashews, legumes, lentils, and chickpeas   D Disaccharides Lactose Milk, custard, ice cream, and yogurt   M Monosaccharides "Free fructose" (fructose in excess of glucose) Apples, pears, mangoes, cherries, watermelon, asparagus, sugar snap peas, honey, high-fructose corn syrup   A And  P Polyols Sorbitol, mannitol, maltitol, and xylitol Apples, pears, apricots, cherries, nectarines, peaches, plums, watermelon,  mushrooms, cauliflower, artificially sweetened chewing gum and confectionery    FODMAPs: fermentable oligosaccharides, disaccharides, monosaccharides, and polyols. Adapted by permission from [employer]: [employer]. Lonell Face, [employer], [place]. Short-chain carbohydrates and functional gastrointestinal disorders. Am J Gastroenterol 2013; 108:707. Copyright  2013. www.nature.com/ajg. Graphic 51884 Version 2.0      No orders of the defined types were placed in this encounter.   Meds ordered this encounter  Medications  . cyclobenzaprine (FLEXERIL) 5 MG tablet    Sig: Take 1 tablet (5 mg total) by mouth 3 (three) times daily as needed for muscle spasms. MILD/MODERATE SPASM    Dispense:  90 tablet    Refill:  1  . DEPO-ESTRADIOL 5 MG/ML injection    Sig: Inject 0.8 mLs (4 mg total) into the muscle once a week. (SUBQ)    Dispense:  5 mL    Refill:  1  . glycopyrrolate (ROBINUL) 1 MG tablet    Sig: Take 2 tablets (2 mg total) by mouth 2 (two) times daily.    Dispense:  180 tablet    Refill:  3  . meloxicam (MOBIC) 7.5 MG tablet    Sig: Take 1 tablet (7.5 mg total) by mouth daily as needed for pain.    Dispense:  90 tablet    Refill:  1  . methocarbamol (ROBAXIN) 500 MG tablet    Sig: Take 1 tablet (500 mg total) by mouth 2 (two) times daily as needed. SEVERE SPASMS    Dispense:  20 tablet    Refill:  0  . primidone (MYSOLINE) 50 MG tablet  Sig: Take 1 tablet (50 mg total) by mouth 2 (two) times daily.    Dispense:  180 tablet    Refill:  3  . Semaglutide,0.25 or 0.5MG /DOS, 2 MG/1.5ML SOPN    Sig: Inject 0.5 mg into the skin once a week.    Dispense:  6 pen    Refill:  5  . spironolactone (ALDACTONE) 50 MG tablet    Sig: Take 1 tablet (50 mg total) by mouth 2 (two) times daily.    Dispense:  180 tablet    Refill:  3  . Eluxadoline (VIBERZI) 75 MG TABS    Sig: Take 75 mg by mouth in the morning and at bedtime.    Dispense:  60  tablet    Refill:  2       Follow-up instructions: Return in about 5 months (around 11/02/2019) for ANNUAL (call week prior to visit for lab orders).                                         BP 104/73 (BP Location: Left Arm, Patient Position: Sitting)   Pulse 89   Temp 98 F (36.7 C)   Wt 284 lb (128.8 kg)   BMI 41.94 kg/m   Current Meds  Medication Sig  . amitriptyline (ELAVIL) 25 MG tablet Take 25 mg by mouth at bedtime.  Marland Kitchen atenolol (TENORMIN) 50 MG tablet Take 1 tablet (50 mg total) by mouth daily.  Marland Kitchen azelastine (ASTELIN) 0.1 % nasal spray   . buPROPion 450 MG TB24 Take 450 mg by mouth every morning.  . Cholecalciferol (VITAMIN D3 PO) Take 50,000 Units by mouth once a week.  . cyclobenzaprine (FLEXERIL) 5 MG tablet Take 1 tablet (5 mg total) by mouth 3 (three) times daily as needed for muscle spasms. MILD/MODERATE SPASM  . DEPO-ESTRADIOL 5 MG/ML injection Inject 0.8 mLs (4 mg total) into the muscle once a week. (SUBQ)  . dexlansoprazole (DEXILANT) 60 MG capsule Take 1 capsule (60 mg total) by mouth daily.  Marland Kitchen dicyclomine (BENTYL) 20 MG tablet Take 2 tablets (40 mg total) by mouth 2 (two) times daily as needed for spasms.  Marland Kitchen DIGESTIVE ENZYMES PO Take 1 Dose by mouth daily. Digestive enzymes/probiotic  . DULoxetine (CYMBALTA) 60 MG capsule Take 60 mg by mouth daily.  . fenofibrate 160 MG tablet Take 160 mg by mouth daily.  . finasteride (PROPECIA) 1 MG tablet Take 1 mg by mouth daily.  Marland Kitchen glycopyrrolate (ROBINUL) 1 MG tablet Take 2 tablets (2 mg total) by mouth 2 (two) times daily.  Marland Kitchen ibuprofen (ADVIL) 800 MG tablet Take 800 mg by mouth 3 (three) times daily.  . Insulin Pen Needle (B-D ULTRAFINE III SHORT PEN) 31G X 8 MM MISC USE AS DIRECTED  . lisinopril (ZESTRIL) 5 MG tablet Take 5 mg by mouth daily.  . meloxicam (MOBIC) 7.5 MG tablet Take 1 tablet (7.5 mg total) by mouth daily as needed for pain.  . methocarbamol (ROBAXIN) 500 MG tablet Take  1 tablet (500 mg total) by mouth 2 (two) times daily as needed. SEVERE SPASMS  . NEEDLE, DISP, 18 G (B-D HYPODERMIC NEEDLE 18GX1.5") 18G X 1-1/2" MISC Use 1 Needle free injection every 7 (seven) days To draw up estrogen  . Omega-3 1000 MG CAPS Take by mouth.  . ondansetron (ZOFRAN) 4 MG tablet Take 1-2 tablets (4-8 mg total) by mouth every 8 (  eight) hours as needed for nausea or vomiting.  . primidone (MYSOLINE) 50 MG tablet Take 1 tablet (50 mg total) by mouth 2 (two) times daily.  . Semaglutide,0.25 or 0.5MG /DOS, 2 MG/1.5ML SOPN Inject 0.5 mg into the skin once a week.  . spironolactone (ALDACTONE) 50 MG tablet Take 1 tablet (50 mg total) by mouth 2 (two) times daily.  . [DISCONTINUED] cyclobenzaprine (FLEXERIL) 5 MG tablet   . [DISCONTINUED] DEPO-ESTRADIOL 5 MG/ML injection 8 mg.   . [DISCONTINUED] glycopyrrolate (ROBINUL) 1 MG tablet Take 2 mg by mouth 2 (two) times daily.  . [DISCONTINUED] lisinopril (ZESTRIL) 20 MG tablet Take 20 mg by mouth daily.  . [DISCONTINUED] meloxicam (MOBIC) 7.5 MG tablet Take 1 tablet (7.5 mg total) by mouth daily as needed for up to 14 doses for pain.  . [DISCONTINUED] methocarbamol (ROBAXIN) 500 MG tablet Take 1 tablet (500 mg total) by mouth 2 (two) times daily as needed.  . [DISCONTINUED] primidone (MYSOLINE) 50 MG tablet Take 1 tablet (50 mg total) by mouth 2 (two) times daily.  . [DISCONTINUED] Semaglutide,0.25 or 0.5MG /DOS, 2 MG/1.5ML SOPN Inject into the skin.  . [DISCONTINUED] spironolactone (ALDACTONE) 50 MG tablet Take 50 mg by mouth 2 (two) times daily.  . [DISCONTINUED] Vitamin D, Ergocalciferol, (DRISDOL) 1.25 MG (50000 UNIT) CAPS capsule Take 1 capsule (50,000 Units total) by mouth every 7 (seven) days. Take for 8 total doses(weeks)    No results found for this or any previous visit (from the past 72 hour(s)).  No results found.  Depression screen PHQ 2/9 04/21/2019  Decreased Interest 2  Down, Depressed, Hopeless 2  PHQ - 2 Score 4  Altered  sleeping 2  Tired, decreased energy 2  Change in appetite 2  Feeling bad or failure about yourself  2  Trouble concentrating 2  Moving slowly or fidgety/restless 2  Suicidal thoughts 2  PHQ-9 Score 18  Difficult doing work/chores Very difficult    GAD 7 : Generalized Anxiety Score 04/21/2019  Nervous, Anxious, on Edge 2  Control/stop worrying 2  Worry too much - different things 1  Trouble relaxing 3  Restless 2  Easily annoyed or irritable 1  Afraid - awful might happen 2  Total GAD 7 Score 13  Anxiety Difficulty Very difficult      All questions at time of visit were answered - patient instructed to contact office with any additional concerns or updates.  ER/RTC precautions were reviewed with the patient.  Please note: voice recognition software was used to produce this document, and typos may escape review. Please contact Dr. Sheppard Coil for any needed clarifications.   Total encounter time: 30 minutes.

## 2019-06-02 NOTE — Patient Instructions (Addendum)
Psychiatry [id number]   IBS Viberzi  2017 UpToDate Characteristics and sources of common FODMAPs  Word that corresponds to letter in acronym Compounds in this category Foods that contain these compounds  F Fermentable  O Oligosaccharides Fructans, galacto-oligosaccharides Wheat, barley, rye, onion, leek, white part of spring onion, garlic, shallots, artichokes, beetroot, fennel, peas, chicory, pistachio, cashews, legumes, lentils, and chickpeas   D Disaccharides Lactose Milk, custard, ice cream, and yogurt   M Monosaccharides "Free fructose" (fructose in excess of glucose) Apples, pears, mangoes, cherries, watermelon, asparagus, sugar snap peas, honey, high-fructose corn syrup   A And  P Polyols Sorbitol, mannitol, maltitol, and xylitol Apples, pears, apricots, cherries, nectarines, peaches, plums, watermelon, mushrooms, cauliflower, artificially sweetened chewing gum and confectionery    FODMAPs: fermentable oligosaccharides, disaccharides, monosaccharides, and polyols. Adapted by permission from [employer]: [employer]. Lonell Face, [employer], [place]. Short-chain carbohydrates and functional gastrointestinal disorders. Am J Gastroenterol 2013; 108:707. Copyright  2013. www.nature.com/ajg. Graphic 83338 Version 2.0

## 2019-06-02 NOTE — Telephone Encounter (Signed)
Received fax for PA on Viberzi sent through cover my meds waiting on determination. - CF

## 2019-06-03 NOTE — Telephone Encounter (Signed)
Received fax from [facility] they denied coverage on Viberzi due to it is not on plan's formulary and he must try and fail the formulary drugs or have adverse effect to them. Placing in providers box for review. - CF

## 2019-06-04 ENCOUNTER — Encounter: Payer: Self-pay | Admitting: Osteopathic Medicine

## 2019-06-04 ENCOUNTER — Telehealth: Payer: Self-pay | Admitting: Osteopathic Medicine

## 2019-06-04 DIAGNOSIS — Z87898 Personal history of other specified conditions: Secondary | ICD-10-CM | POA: Insufficient documentation

## 2019-06-04 HISTORY — DX: Personal history of other specified conditions: Z87.898

## 2019-06-04 NOTE — Telephone Encounter (Signed)
Filled out redetermination for and faxed back to [employer] to try and get pateint's Viberzi approved. Waiting on determination. - CF

## 2019-06-04 NOTE — Telephone Encounter (Signed)
Received fax for PA on Methocarbamol sent through cover my meds waiting on determination. - Cf

## 2019-06-04 NOTE — Telephone Encounter (Signed)
Received fax from [place] and they denied coverage on Methocarbamol due to it is not on patient's plan formulary.  Placed in providers box for determination. - CF

## 2019-06-04 NOTE — Telephone Encounter (Signed)
Received fax from [place] and they approved coverage on Viberzi form 01/02/2019 - 01/01/2020. - CF

## 2019-06-04 NOTE — Telephone Encounter (Signed)
Filled out for for Redetermination on Methocarbamol and faxed to [facility] waiting on determination. - CF

## 2019-06-08 NOTE — Telephone Encounter (Signed)
Received fax from [place] on Methocarbamol and they approved medication from 01/02/2019 - 01/01/2020. - CF

## 2019-06-12 ENCOUNTER — Encounter: Payer: Self-pay | Admitting: Osteopathic Medicine

## 2019-06-15 DIAGNOSIS — R69 Illness, unspecified: Secondary | ICD-10-CM | POA: Diagnosis not present

## 2019-06-18 MED ORDER — LISINOPRIL 5 MG PO TABS: 5.0000 mg | ORAL_TABLET | Freq: Every day | ORAL | 3 refills | Status: DC

## 2019-06-22 DIAGNOSIS — R69 Illness, unspecified: Secondary | ICD-10-CM | POA: Diagnosis not present

## 2019-06-29 DIAGNOSIS — I10 Essential (primary) hypertension: Secondary | ICD-10-CM | POA: Diagnosis not present

## 2019-06-29 DIAGNOSIS — G8929 Other chronic pain: Secondary | ICD-10-CM | POA: Diagnosis not present

## 2019-06-29 DIAGNOSIS — G4730 Sleep apnea, unspecified: Secondary | ICD-10-CM | POA: Diagnosis not present

## 2019-06-29 DIAGNOSIS — R69 Illness, unspecified: Secondary | ICD-10-CM | POA: Diagnosis not present

## 2019-06-29 DIAGNOSIS — Z008 Encounter for other general examination: Secondary | ICD-10-CM | POA: Diagnosis not present

## 2019-06-29 DIAGNOSIS — K219 Gastro-esophageal reflux disease without esophagitis: Secondary | ICD-10-CM | POA: Diagnosis not present

## 2019-06-29 DIAGNOSIS — Z6841 Body Mass Index (BMI) 40.0 and over, adult: Secondary | ICD-10-CM | POA: Diagnosis not present

## 2019-06-29 DIAGNOSIS — I739 Peripheral vascular disease, unspecified: Secondary | ICD-10-CM | POA: Diagnosis not present

## 2019-06-29 DIAGNOSIS — E785 Hyperlipidemia, unspecified: Secondary | ICD-10-CM | POA: Diagnosis not present

## 2019-07-13 DIAGNOSIS — R69 Illness, unspecified: Secondary | ICD-10-CM | POA: Diagnosis not present

## 2019-07-22 DIAGNOSIS — R69 Illness, unspecified: Secondary | ICD-10-CM | POA: Diagnosis not present

## 2019-07-22 MED ORDER — FENOFIBRATE 160 MG PO TABS: 160.0000 mg | ORAL_TABLET | Freq: Every day | ORAL | 3 refills | Status: DC

## 2019-07-22 NOTE — Telephone Encounter (Signed)
Rx written by historical provider. Rx pended.  

## 2019-07-22 NOTE — Addendum Note (Signed)
Addended by: Delfino Lovett on: 07/22/2019 08:46 AM   Modules accepted: Orders

## 2019-08-05 DIAGNOSIS — R69 Illness, unspecified: Secondary | ICD-10-CM | POA: Diagnosis not present

## 2019-08-10 DIAGNOSIS — R69 Illness, unspecified: Secondary | ICD-10-CM | POA: Diagnosis not present

## 2019-08-16 ENCOUNTER — Emergency Department (HOSPITAL_BASED_OUTPATIENT_CLINIC_OR_DEPARTMENT_OTHER)
Admission: EM | Admit: 2019-08-16 | Discharge: 2019-08-16 | Disposition: A | Discharge status: Home / Self Care | Payer: Medicare HMO | Source: Home / Self Care | Attending: Emergency Medicine | Admitting: Emergency Medicine

## 2019-08-16 ENCOUNTER — Encounter (HOSPITAL_BASED_OUTPATIENT_CLINIC_OR_DEPARTMENT_OTHER): Payer: Self-pay | Admitting: Emergency Medicine

## 2019-08-16 ENCOUNTER — Other Ambulatory Visit: Payer: Self-pay

## 2019-08-16 ENCOUNTER — Encounter (HOSPITAL_COMMUNITY): Payer: Self-pay

## 2019-08-16 ENCOUNTER — Emergency Department (HOSPITAL_COMMUNITY)
Admission: EM | Admit: 2019-08-16 | Discharge: 2019-08-16 | Disposition: A | Discharge status: Home / Self Care | Payer: Medicare HMO | Attending: Emergency Medicine | Admitting: Emergency Medicine

## 2019-08-16 DIAGNOSIS — R110 Nausea: Secondary | ICD-10-CM | POA: Diagnosis not present

## 2019-08-16 DIAGNOSIS — Z8669 Personal history of other diseases of the nervous system and sense organs: Secondary | ICD-10-CM | POA: Insufficient documentation

## 2019-08-16 DIAGNOSIS — R519 Headache, unspecified: Secondary | ICD-10-CM | POA: Insufficient documentation

## 2019-08-16 DIAGNOSIS — H53149 Visual discomfort, unspecified: Secondary | ICD-10-CM | POA: Insufficient documentation

## 2019-08-16 DIAGNOSIS — J45909 Unspecified asthma, uncomplicated: Secondary | ICD-10-CM | POA: Insufficient documentation

## 2019-08-16 DIAGNOSIS — I10 Essential (primary) hypertension: Secondary | ICD-10-CM | POA: Insufficient documentation

## 2019-08-16 DIAGNOSIS — Z794 Long term (current) use of insulin: Secondary | ICD-10-CM | POA: Insufficient documentation

## 2019-08-16 DIAGNOSIS — Z79899 Other long term (current) drug therapy: Secondary | ICD-10-CM | POA: Insufficient documentation

## 2019-08-16 DIAGNOSIS — Z5321 Procedure and treatment not carried out due to patient leaving prior to being seen by health care provider: Secondary | ICD-10-CM | POA: Insufficient documentation

## 2019-08-16 DIAGNOSIS — Z87891 Personal history of nicotine dependence: Secondary | ICD-10-CM | POA: Insufficient documentation

## 2019-08-16 MED ORDER — SODIUM CHLORIDE 0.9 % IV BOLUS
1000.0000 mL | Freq: Once | INTRAVENOUS | Status: AC
  Administered 2019-08-16: 1000 mL via INTRAVENOUS

## 2019-08-16 MED ORDER — DIPHENHYDRAMINE HCL 50 MG/ML IJ SOLN
25.0000 mg | Freq: Once | INTRAMUSCULAR | Status: AC
  Administered 2019-08-16: 25 mg via INTRAVENOUS
  Filled 2019-08-16: qty 1

## 2019-08-16 MED ORDER — KETOROLAC TROMETHAMINE 15 MG/ML IJ SOLN
15.0000 mg | Freq: Once | INTRAMUSCULAR | Status: AC
  Administered 2019-08-16: 15 mg via INTRAVENOUS
  Filled 2019-08-16: qty 1

## 2019-08-16 MED ORDER — METOCLOPRAMIDE HCL 5 MG/ML IJ SOLN
10.0000 mg | Freq: Once | INTRAMUSCULAR | Status: AC
  Administered 2019-08-16: 10 mg via INTRAVENOUS
  Filled 2019-08-16: qty 2

## 2019-08-16 NOTE — ED Notes (Signed)
Pt returned stickers to registration and left d/t wait.

## 2019-08-16 NOTE — ED Triage Notes (Signed)
Pt reports a headache x 4-5 days. Reports a hx of migraines. Endorses photophobia and some nausea. States that Excedrin usually helps, but isn't this time.

## 2019-08-16 NOTE — ED Triage Notes (Signed)
Headache x 4-5 days with neck stiffness. Denies N/V

## 2019-08-16 NOTE — ED Provider Notes (Signed)
[facility] EMERGENCY DEPARTMENT Provider Note   CSN: [id number] Arrival date & time: 08/16/19  5277     History Chief Complaint  Patient presents with  . Headache    Gary Sullivan is a 44 y.o. adult.   Headache Pain location:  Frontal Quality:  Dull Radiates to:  Does not radiate Onset quality:  Gradual Timing:  Constant Progression:  Waxing and waning Chronicity:  Recurrent Similar to prior headaches: yes   Relieved by:  Nothing Worsened by:  Nothing Ineffective treatments:  NSAIDs Associated symptoms: photophobia   Associated symptoms: no back pain, no congestion, no cough, no diarrhea, no dizziness, no fever, no loss of balance, no nausea and no vomiting   Risk factors: insomnia        Past Medical History:  Diagnosis Date  . Asthma   . Elevated heart rate with elevated blood pressure and diagnosis of hypertension   . Environmental allergies   . Fibromyalgia   . Gender dysphoria   . GERD (gastroesophageal reflux disease)   . High cholesterol   . History of prediabetes 06/04/2019  . Hyperhidrosis   . Hypertension   . IBS (irritable bowel syndrome)   . Transgender 04/21/2019    Patient Active Problem List   Diagnosis Date Noted  . History of prediabetes 06/04/2019  . Transgender 04/21/2019  . Carpal tunnel syndrome on right 10/07/2018  . OSA (obstructive sleep apnea) 06/19/2018  . Excessive daytime sleepiness 06/19/2018  . Numbness 05/28/2018  . Tremor 05/28/2018  . Fibromyalgia 05/28/2018  . Depression with anxiety 05/28/2018  . High cholesterol 01/02/2012  . High blood pressure 01/02/2008  . IBS (irritable bowel syndrome) 01/01/2006  . GERD (gastroesophageal reflux disease) 01/01/2005  . Social anxiety disorder 01/02/1988  . Gender dysphoria 01/01/1985  . Asthma 01/01/1982    Past Surgical History:  Procedure Laterality Date  . CARPAL TUNNEL RELEASE Right 10/08/2018   Procedure: RIGHT CARPAL TUNNEL RELEASE;  Surgeon: Tarry Kos, MD;  Location:  SURGERY CENTER;  Service: Orthopedics;  Laterality: Right;  . CHOLECYSTECTOMY    . labrum repair    . NASAL SEPTUM SURGERY    . SHOULDER ARTHROSCOPY WITH SUBACROMIAL DECOMPRESSION Left   . TONSILLECTOMY         Family History  Problem Relation Age of Onset  . High blood pressure Mother   . High blood pressure Father   . High blood pressure Brother   . Pancreatic cancer Maternal Grandfather   . Lung cancer Paternal Grandmother   . Diabetes Paternal Grandfather     Social History   Tobacco Use  . Smoking status: Former Smoker    Types: Cigarettes  . Smokeless tobacco: Never Used  Vaping Use  . Vaping Use: Never used  Substance Use Topics  . Alcohol use: Not Currently  . Drug use: Not Currently    Home Medications Prior to Admission medications   Medication Sig Start Date End Date Taking? Authorizing Provider  amitriptyline (ELAVIL) 25 MG tablet Take 25 mg by mouth at bedtime.    [provider]  atenolol (TENORMIN) 50 MG tablet Take 1 tablet (50 mg total) by mouth daily. 05/21/19   [name], DO  azelastine (ASTELIN) 0.1 % nasal spray  11/03/18   [provider]  buPROPion 450 MG TB24 Take 450 mg by mouth every morning. 04/21/19   [name], DO  Cholecalciferol (VITAMIN D3 PO) Take 50,000 Units by mouth once a week.    [provider]  cyclobenzaprine (FLEXERIL) 5 MG tablet Take 1 tablet (5 mg total) by mouth 3 (three) times daily as needed for muscle spasms. MILD/MODERATE SPASM 06/02/19   [name], DO  DEPO-ESTRADIOL 5 MG/ML injection Inject 0.8 mLs (4 mg total) into the muscle once a week. (SUBQ) 06/02/19   [name], DO  dexlansoprazole (DEXILANT) 60 MG capsule Take 1 capsule (60 mg total) by mouth daily. 05/28/19   [name], DO  dicyclomine (BENTYL) 20 MG tablet Take 2 tablets (40 mg total) by mouth 2 (two) times daily as needed for spasms. 05/21/19   [name], DO    DIGESTIVE ENZYMES PO Take 1 Dose by mouth daily. Digestive enzymes/probiotic    [provider]  DULoxetine (CYMBALTA) 60 MG capsule Take 60 mg by mouth daily.    [provider]  Eluxadoline (VIBERZI) 75 MG TABS Take 75 mg by mouth in the morning and at bedtime. 06/02/19   [name], DO  fenofibrate 160 MG tablet Take 1 tablet (160 mg total) by mouth daily. 07/22/19   [name], DO  finasteride (PROPECIA) 1 MG tablet Take 1 mg by mouth daily.    [provider]  glycopyrrolate (ROBINUL) 1 MG tablet Take 2 tablets (2 mg total) by mouth 2 (two) times daily. 06/02/19   [name], DO  ibuprofen (ADVIL) 800 MG tablet Take 800 mg by mouth 3 (three) times daily. 04/16/19   [provider]  Insulin Pen Needle (B-D ULTRAFINE III SHORT PEN) 31G X 8 MM MISC USE AS DIRECTED 11/20/18   [provider]  lisinopril (ZESTRIL) 5 MG tablet Take 1 tablet (5 mg total) by mouth daily. 06/18/19   [name], DO  meloxicam (MOBIC) 7.5 MG tablet Take 1 tablet (7.5 mg total) by mouth daily as needed for pain. 06/02/19   [name], DO  methocarbamol (ROBAXIN) 500 MG tablet Take 1 tablet (500 mg total) by mouth 2 (two) times daily as needed. SEVERE SPASMS 06/02/19   [name], DO  NEEDLE, DISP, 18 G (B-D HYPODERMIC NEEDLE 18GX1.5") 18G X 1-1/2" MISC Use 1 Needle free injection every 7 (seven) days To draw up estrogen 11/07/18   [provider]  Omega-3 1000 MG CAPS Take by mouth.    [provider]  ondansetron (ZOFRAN) 4 MG tablet Take 1-2 tablets (4-8 mg total) by mouth every 8 (eight) hours as needed for nausea or vomiting. 10/08/18   Tarry Kos, MD  primidone (MYSOLINE) 50 MG tablet Take 1 tablet (50 mg total) by mouth 2 (two) times daily. 06/02/19   [name], DO  Semaglutide,0.25 or 0.5MG /DOS, 2 MG/1.5ML SOPN Inject 0.5 mg into the skin once a week. 06/02/19   [name], DO  spironolactone  (ALDACTONE) 50 MG tablet Take 1 tablet (50 mg total) by mouth 2 (two) times daily. 06/02/19   [name], DO    Allergies    Molds & smuts, Dog epithelium allergy skin test, Sulfamethoxazole-trimethoprim, Sulfa antibiotics, and Cat hair extract  Review of Systems   Review of Systems  Constitutional: Negative for chills and fever.  HENT: Negative for congestion and rhinorrhea.   Eyes: Positive for photophobia.  Respiratory: Negative for cough and shortness of breath.   Cardiovascular: Negative for chest pain and palpitations.  Gastrointestinal: Negative for diarrhea, nausea and vomiting.  Genitourinary: Negative for difficulty urinating and dysuria.  Musculoskeletal: Negative for arthralgias and back pain.  Skin: Negative for color change and rash.  Neurological: Positive for headaches.  Negative for dizziness, light-headedness and loss of balance.    Physical Exam Updated Vital Signs BP 103/80 (BP Location: Left Arm)   Pulse 79   Temp 98.9 F (37.2 C) (Oral)   Resp 18   Ht 5\' 9"  (1.753 m)   Wt 136.1 kg   SpO2 98%   BMI 44.30 kg/m   Physical Exam Vitals and nursing note reviewed. Exam conducted with a chaperone present.  Constitutional:      General: She is not in acute distress.    Appearance: Normal appearance.  HENT:     Head: Normocephalic and atraumatic.     Nose: No rhinorrhea.  Eyes:     General: No visual field deficit.       Right eye: No discharge.        Left eye: No discharge.     Conjunctiva/sclera: Conjunctivae normal.  Cardiovascular:     Rate and Rhythm: Normal rate and regular rhythm.  Pulmonary:     Effort: Pulmonary effort is normal. No respiratory distress.     Breath sounds: No stridor.  Abdominal:     General: Abdomen is flat. There is no distension.     Palpations: Abdomen is soft.  Musculoskeletal:        General: No tenderness or signs of injury.  Skin:    General: Skin is warm and dry.  Neurological:     General: No focal  deficit present.     Mental Status: She is alert. Mental status is at baseline.     GCS: GCS eye subscore is 4. GCS verbal subscore is 5. GCS motor subscore is 6.     Cranial Nerves: No cranial nerve deficit, dysarthria or facial asymmetry.     Sensory: No sensory deficit.     Motor: No weakness.     Coordination: Coordination normal.     Gait: Gait normal.     Comments: 5 out of 5 motor strength in all extremities, sensation intact throughout, no dysmetria, no dysdiadochokinesia, no ataxia with ambulation, cranial nerves II through XII intact, alert and oriented to person place and time   Psychiatric:        Mood and Affect: Mood normal.        Behavior: Behavior normal.     ED Results / Procedures / Treatments   Labs (all labs ordered are listed, but only abnormal results are displayed) Labs Reviewed - No data to display  EKG None  Radiology No results found.  Procedures Procedures (including critical care time)  Medications Ordered in ED Medications  metoCLOPramide (REGLAN) injection 10 mg (10 mg Intravenous Given 08/16/19 0806)  diphenhydrAMINE (BENADRYL) injection 25 mg (25 mg Intravenous Given 08/16/19 0807)  ketorolac (TORADOL) 15 MG/ML injection 15 mg (15 mg Intravenous Given 08/16/19 0806)  sodium chloride 0.9 % bolus 1,000 mL (1,000 mLs Intravenous New Bag/Given 08/16/19 [id number])    ED Course  I have reviewed the triage vital signs and the nursing notes.  Pertinent labs & imaging results that were available during my care of the patient were reviewed by me and considered in my medical decision making (see chart for details).    MDM Rules/Calculators/A&P                         History of migraine headache, says this feels the same but has progressed and has not gotten better.  Over-the-counter meds have not worked.  No recent illness no recent trauma no  neurologic deficit.  No signs of intracranial mass lesion or bleed from history of physical no signs of CNS  infection from history or physical.  Needs migraine therapy to include IV fluids Toradol Reglan Benadryl.  Patient is reassessed and is feeling much better, actually had to wake him from sleeping, he feels safe for discharge.  Return precautions are given.  He is advised to find a ride home as he may be too sleepy to drive.  Final Clinical Impression(s) / ED Diagnoses Final diagnoses:  Acute nonintractable headache, unspecified headache type    Rx / DC Orders ED Discharge Orders    None       Sabino Donovan, MD 08/16/19 [phone]

## 2019-08-21 ENCOUNTER — Encounter: Payer: Self-pay | Admitting: Osteopathic Medicine

## 2019-08-24 DIAGNOSIS — R69 Illness, unspecified: Secondary | ICD-10-CM | POA: Diagnosis not present

## 2019-08-25 ENCOUNTER — Encounter: Payer: Self-pay | Admitting: Osteopathic Medicine

## 2019-08-25 ENCOUNTER — Ambulatory Visit (INDEPENDENT_AMBULATORY_CARE_PROVIDER_SITE_OTHER): Payer: Medicare HMO | Admitting: Osteopathic Medicine

## 2019-08-25 VITALS — BP 101/54 | HR 93 | Wt 294.0 lb

## 2019-08-25 DIAGNOSIS — R82998 Other abnormal findings in urine: Secondary | ICD-10-CM

## 2019-08-25 DIAGNOSIS — G8929 Other chronic pain: Secondary | ICD-10-CM | POA: Diagnosis not present

## 2019-08-25 DIAGNOSIS — R519 Headache, unspecified: Secondary | ICD-10-CM

## 2019-08-25 LAB — COMPLETE METABOLIC PANEL WITH GFR
AG Ratio: 1.8 (calc) (ref 1.0–2.5)
ALT: 76 U/L — ABNORMAL HIGH (ref 9–46)
AST: 66 U/L — ABNORMAL HIGH (ref 10–40)
Albumin: 4.2 g/dL (ref 3.6–5.1)
Alkaline phosphatase (APISO): 55 U/L (ref 36–130)
BUN: 18 mg/dL (ref 7–25)
CO2: 27 mmol/L (ref 20–32)
Calcium: 8.9 mg/dL (ref 8.6–10.3)
Chloride: 101 mmol/L (ref 98–110)
Creat: 1.32 mg/dL (ref 0.60–1.35)
GFR, Est African American: 77 mL/min/{1.73_m2} (ref >=60)
GFR, Est Non African American: 66 mL/min/{1.73_m2} (ref >=60)
Globulin: 2.4 g/dL (calc) (ref 1.9–3.7)
Glucose, Bld: 100 mg/dL (ref 65–139)
Potassium: 4.3 mmol/L (ref 3.5–5.3)
Sodium: 136 mmol/L (ref 135–146)
Total Bilirubin: 0.4 mg/dL (ref 0.2–1.2)
Total Protein: 6.6 g/dL (ref 6.1–8.1)

## 2019-08-25 LAB — POCT URINALYSIS DIP (CLINITEK)
Bilirubin, UA: NEGATIVE
Blood, UA: NEGATIVE
Glucose, UA: NEGATIVE mg/dL
Ketones, POC UA: NEGATIVE mg/dL
Leukocytes, UA: NEGATIVE
Nitrite, UA: NEGATIVE
POC PROTEIN,UA: NEGATIVE
Spec Grav, UA: 1.02 (ref 1.010–1.025)
Urobilinogen, UA: 1 E.U./dL
pH, UA: 5.5 (ref 5.0–8.0)

## 2019-08-25 LAB — CBC
HCT: 37.1 % — ABNORMAL LOW (ref 38.5–50.0)
Hemoglobin: 12.7 g/dL — ABNORMAL LOW (ref 13.2–17.1)
MCH: 31 pg (ref 27.0–33.0)
MCHC: 34.2 g/dL (ref 32.0–36.0)
MCV: 90.5 fL (ref 80.0–100.0)
MPV: 9.1 fL (ref 7.5–12.5)
Platelets: 338 10*3/uL (ref 140–400)
RBC: 4.1 10*6/uL — ABNORMAL LOW (ref 4.20–5.80)
RDW: 11.9 % (ref 11.0–15.0)
WBC: 7.6 10*3/uL (ref 3.8–10.8)

## 2019-08-25 MED ORDER — DIVALPROEX SODIUM ER 500 MG PO TB24: ORAL_TABLET | ORAL | 0 refills | Status: DC

## 2019-08-25 MED ORDER — PROMETHAZINE HCL 25 MG/ML IJ SOLN
25.0000 mg | Freq: Once | INTRAMUSCULAR | Status: AC
  Administered 2019-08-25: 25 mg via INTRAMUSCULAR

## 2019-08-25 MED ORDER — DEXAMETHASONE SODIUM PHOSPHATE 10 MG/ML IJ SOLN
4.0000 mg | Freq: Once | INTRAMUSCULAR | Status: AC
  Administered 2019-08-25: 4 mg via INTRAMUSCULAR

## 2019-08-25 MED ORDER — SUMATRIPTAN SUCCINATE 50 MG PO TABS: 50.0000 mg | ORAL_TABLET | ORAL | 0 refills | Status: DC | PRN

## 2019-08-25 MED ORDER — SUMATRIPTAN SUCCINATE 6 MG/0.5ML ~~LOC~~ SOLN
6.0000 mg | Freq: Once | SUBCUTANEOUS | Status: AC
  Administered 2019-08-25: 6 mg via SUBCUTANEOUS

## 2019-08-25 NOTE — Progress Notes (Signed)
Gary Sullivan is a 44 y.o. adult who presents to  [facility] at [facility]  today, 08/25/19, seeking care for the following:  . Today: w/ headaches, reports pain starting in frontal area and extending to occipital area, described as throbbing/pressure but also sharp, onset 1-2 weeks ago, hx intermittent rare headaches in the past. Associated w/ tunnel vision, blurry vision, dizziness, nausea/vomiting, photophpbia, phonophobia. Reports these associated symptoms are new. Has been taking a lot of Tylenol and ibuprofen daily (1500 mg tylenol at a time several times per day same w/ 800 mg ibuprofen and 700 mg aspirin). Noticed orange urine last night. Reports daily RUQ pain 2-3 hours after taking Rx (note he is s/p cholecystectomy).   . Other relevant records o ED records reviewed from visit 08/16/19 - Hx migraines per patient, seen in ED for persistent headache, Tx IVF, Toradol, Reglan, Benadryl which resulted in improvement, pt d/c home.  o Neurology - Initial visit w/ neurology 05/28/2018 concern for numbness/tingling hands and spine w/ (+)FH MS --> NCV/EMG and MRI, Dx benign essential tremor (later added primidone in addition to atenolol). Also hx OSA.  - MRI brain 07/23/2018 for numbness/tremor, "There are 7 small round T2/flair hyperintense foci in the subcortical white matter of the hemispheres.  This is a nonspecific finding and most likely represents minimal chronic microvascular ischemic changes or sequela of prior trauma or migraine.  Demyelination is much less likely with this appearance."  - NCV/EMG 07/29/2018 (+)moderate R median neuropathy - Pt was no-show to appt 09/01/2018  . Current relevant Rx: o OTC: frequent use ibuprofen and tylenol  o Rx: atenolol 50 mg daily, amitriptyline 25 mg qhs, cyclobenzaprine 5 mg prn mild spasms, duloxetine 60 mg daily, methocarbamol 500 mg bid severe spasms, primidone (treating tremor) 50 mg bid     On exam  -  PERRL, EOMI, limited funduscopic exam appears WNL, normal strength/sensation, normal coordination. (+)TTP RUQ, RRR, WNL S1S2, CTABL       ASSESSMENT & PLAN with other pertinent findings:  The primary encounter diagnosis was Chronic nonintractable headache, unspecified headache type. A diagnosis of Dark urine was also pertinent to this visit.   Results for orders placed or performed in visit on 08/25/19 (from the past 24 hour(s))  POCT URINALYSIS DIP (CLINITEK)     Status: Abnormal   Collection Time: 08/25/19 11:21 AM  Result Value Ref Range   Color, UA other (A) yellow   Clarity, UA clear clear   Glucose, UA negative negative mg/dL   Bilirubin, UA negative negative   Ketones, POC UA negative negative mg/dL   Spec Grav, UA 6.433 2.951 - 1.025   Blood, UA negative negative   pH, UA 5.5 5.0 - 8.0   POC PROTEIN,UA negative negative, trace   Urobilinogen, UA 1.0 0.2 or 1.0 E.U./dL   Nitrite, UA Negative Negative   Leukocytes, UA Negative Negative       Patient Instructions  Plan:   I believe this is a migraine, possible tension headache variant   Treatment as below  Avoid over-the-counter Rx ibuprofen, aspirin, acetaminophen - this can cause medication overuse headache and make things worse  OK to use Excedrin Migraine as directed  Will get MRI scheduled   Need labs today to check liver and kidney function given the medication use recently - will call tomorrow w/ results, you may see results on MyChart as soon as they are processed, please await call/message from this office regarding anything that's  abnormal   May consider preventive medications and/or neurology referral depending how you do with treatment / depending on MRI results    Will treat today in office with  Imitrex to help stop headache  Dexamethasone steroid shot to prevent headache coming back  Phenergan nausea medication   Will send home with   Imitrex Rx in case headache recurrence    Depakote Rx taper to prevent headache coming back    If severe pain, especially if weakness or vision loss, please seem emergency medical attention immediately!       Orders Placed This Encounter  Procedures  . MR Brain W Wo Contrast  . COMPLETE METABOLIC PANEL WITH GFR  . CBC  . POCT URINALYSIS DIP (CLINITEK)    Meds ordered this encounter  Medications  . SUMAtriptan (IMITREX) 50 MG tablet    Sig: Take 1 tablet (50 mg total) by mouth every 2 (two) hours as needed for migraine. May repeat in 2 hours if headache persists or recurs. Max 150 mg per 24 hours.    Dispense:  10 tablet    Refill:  0  . divalproex (DEPAKOTE ER) 500 MG 24 hr tablet    Sig: Take 2 tablets (1,000 mg total) by mouth daily for 5 days, THEN 1 tablet (500 mg total) daily for 5 days.    Dispense:  15 tablet    Refill:  0  . SUMAtriptan (IMITREX) injection 6 mg  . promethazine (PHENERGAN) injection 25 mg  . dexamethasone (DECADRON) injection 4 mg       Follow-up instructions: Return in about 1 week (around 09/01/2019) for VIRTUAL VISIT FOLLOW UP HEADACHES / MRI RESULTS .                                         BP (!) 101/54 (Patient Position: Standing)   Pulse 93   Wt 294 lb (133.4 kg)   SpO2 96%   BMI 43.42 kg/m   Current Meds  Medication Sig  . amitriptyline (ELAVIL) 25 MG tablet Take 25 mg by mouth at bedtime.  Marland Kitchen atenolol (TENORMIN) 50 MG tablet Take 1 tablet (50 mg total) by mouth daily.  Marland Kitchen azelastine (ASTELIN) 0.1 % nasal spray   . buPROPion 450 MG TB24 Take 450 mg by mouth every morning.  . Cholecalciferol (VITAMIN D3 PO) Take 50,000 Units by mouth once a week.  . cyclobenzaprine (FLEXERIL) 5 MG tablet Take 1 tablet (5 mg total) by mouth 3 (three) times daily as needed for muscle spasms. MILD/MODERATE SPASM  . DEPO-ESTRADIOL 5 MG/ML injection Inject 0.8 mLs (4 mg total) into the muscle once a week. (SUBQ)  . dexlansoprazole (DEXILANT) 60 MG capsule  Take 1 capsule (60 mg total) by mouth daily.  Marland Kitchen dicyclomine (BENTYL) 20 MG tablet Take 2 tablets (40 mg total) by mouth 2 (two) times daily as needed for spasms.  Marland Kitchen DIGESTIVE ENZYMES PO Take 1 Dose by mouth daily. Digestive enzymes/probiotic  . DULoxetine (CYMBALTA) 60 MG capsule Take 60 mg by mouth daily.  . Eluxadoline (VIBERZI) 75 MG TABS Take 75 mg by mouth in the morning and at bedtime.  . fenofibrate 160 MG tablet Take 1 tablet (160 mg total) by mouth daily.  . finasteride (PROPECIA) 1 MG tablet Take 1 mg by mouth daily.  Marland Kitchen glycopyrrolate (ROBINUL) 1 MG tablet Take 2 tablets (2 mg total) by mouth 2 (two)  times daily.  Marland Kitchen ibuprofen (ADVIL) 800 MG tablet Take 800 mg by mouth 3 (three) times daily.  . Insulin Pen Needle (B-D ULTRAFINE III SHORT PEN) 31G X 8 MM MISC USE AS DIRECTED  . lisinopril (ZESTRIL) 5 MG tablet Take 1 tablet (5 mg total) by mouth daily.  . meloxicam (MOBIC) 7.5 MG tablet Take 1 tablet (7.5 mg total) by mouth daily as needed for pain.  . methocarbamol (ROBAXIN) 500 MG tablet Take 1 tablet (500 mg total) by mouth 2 (two) times daily as needed. SEVERE SPASMS  . NEEDLE, DISP, 18 G (B-D HYPODERMIC NEEDLE 18GX1.5") 18G X 1-1/2" MISC Use 1 Needle free injection every 7 (seven) days To draw up estrogen  . Omega-3 1000 MG CAPS Take by mouth.  . ondansetron (ZOFRAN) 4 MG tablet Take 1-2 tablets (4-8 mg total) by mouth every 8 (eight) hours as needed for nausea or vomiting.  . primidone (MYSOLINE) 50 MG tablet Take 1 tablet (50 mg total) by mouth 2 (two) times daily.  . Semaglutide,0.25 or 0.5MG /DOS, 2 MG/1.5ML SOPN Inject 0.5 mg into the skin once a week.  . spironolactone (ALDACTONE) 50 MG tablet Take 1 tablet (50 mg total) by mouth 2 (two) times daily.    Results for orders placed or performed in visit on 08/25/19 (from the past 72 hour(s))  POCT URINALYSIS DIP (CLINITEK)     Status: Abnormal   Collection Time: 08/25/19 11:21 AM  Result Value Ref Range   Color, UA other (A)  yellow    Comment: dark yellow   Clarity, UA clear clear   Glucose, UA negative negative mg/dL   Bilirubin, UA negative negative   Ketones, POC UA negative negative mg/dL   Spec Grav, UA 1.610 9.604 - 1.025   Blood, UA negative negative   pH, UA 5.5 5.0 - 8.0   POC PROTEIN,UA negative negative, trace   Urobilinogen, UA 1.0 0.2 or 1.0 E.U./dL   Nitrite, UA Negative Negative   Leukocytes, UA Negative Negative    No results found.     All questions at time of visit were answered - patient instructed to contact office with any additional concerns or updates.  ER/RTC precautions were reviewed with the patient as applicable.   Please note: voice recognition software was used to produce this document, and typos may escape review. Please contact Dr. Lyn Hollingshead for any needed clarifications.   Total encounter time: 40 minutes.

## 2019-08-25 NOTE — Patient Instructions (Addendum)
Plan:   I believe this is a migraine, possible tension headache variant   Treatment as below  Avoid over-the-counter Rx ibuprofen, aspirin, acetaminophen - this can cause medication overuse headache and make things worse  OK to use Excedrin Migraine as directed  Will get MRI scheduled   Need labs today to check liver and kidney function given the medication use recently - will call tomorrow w/ results, you may see results on MyChart as soon as they are processed, please await call/message from this office regarding anything that's abnormal   May consider preventive medications and/or neurology referral depending how you do with treatment / depending on MRI results    Will treat today in office with  Imitrex to help stop headache  Dexamethasone steroid shot to prevent headache coming back  Phenergan nausea medication   Will send home with   Imitrex Rx in case headache recurrence   Depakote Rx taper to prevent headache coming back    If severe pain, especially if weakness or vision loss, please seem emergency medical attention immediately!

## 2019-08-26 ENCOUNTER — Encounter: Payer: Self-pay | Admitting: Osteopathic Medicine

## 2019-08-28 NOTE — Progress Notes (Signed)
Krysti,   Dr. Mervyn Skeeters is out of office I am filling in for her. My name is Tandy Gaw PA-C  Your liver enzymes are up. Back off of the tylenol and they will likely go back down. Suggest follow up in 2-3 weeks with labs.   You still anemic. It has gotten a little better. Are you taking any iron supplement?

## 2019-08-29 ENCOUNTER — Other Ambulatory Visit: Payer: Self-pay

## 2019-08-29 ENCOUNTER — Encounter (HOSPITAL_BASED_OUTPATIENT_CLINIC_OR_DEPARTMENT_OTHER): Payer: Self-pay

## 2019-08-29 ENCOUNTER — Emergency Department (HOSPITAL_BASED_OUTPATIENT_CLINIC_OR_DEPARTMENT_OTHER)
Admission: EM | Admit: 2019-08-29 | Discharge: 2019-08-29 | Disposition: A | Discharge status: Home / Self Care | Payer: Medicare HMO | Attending: Emergency Medicine | Admitting: Emergency Medicine

## 2019-08-29 DIAGNOSIS — G43919 Migraine, unspecified, intractable, without status migrainosus: Secondary | ICD-10-CM | POA: Diagnosis not present

## 2019-08-29 DIAGNOSIS — I10 Essential (primary) hypertension: Secondary | ICD-10-CM | POA: Insufficient documentation

## 2019-08-29 DIAGNOSIS — Z79899 Other long term (current) drug therapy: Secondary | ICD-10-CM | POA: Diagnosis not present

## 2019-08-29 DIAGNOSIS — J45909 Unspecified asthma, uncomplicated: Secondary | ICD-10-CM | POA: Diagnosis not present

## 2019-08-29 DIAGNOSIS — Z87891 Personal history of nicotine dependence: Secondary | ICD-10-CM | POA: Diagnosis not present

## 2019-08-29 DIAGNOSIS — G43019 Migraine without aura, intractable, without status migrainosus: Secondary | ICD-10-CM | POA: Diagnosis not present

## 2019-08-29 DIAGNOSIS — Z794 Long term (current) use of insulin: Secondary | ICD-10-CM | POA: Diagnosis not present

## 2019-08-29 DIAGNOSIS — R519 Headache, unspecified: Secondary | ICD-10-CM | POA: Diagnosis present

## 2019-08-29 MED ORDER — KETOROLAC TROMETHAMINE 30 MG/ML IJ SOLN
30.0000 mg | Freq: Once | INTRAMUSCULAR | Status: AC
  Administered 2019-08-29: 30 mg via INTRAMUSCULAR
  Filled 2019-08-29: qty 1

## 2019-08-29 MED ORDER — DIPHENHYDRAMINE HCL 50 MG/ML IJ SOLN
25.0000 mg | Freq: Once | INTRAMUSCULAR | Status: AC
  Administered 2019-08-29: 25 mg via INTRAMUSCULAR
  Filled 2019-08-29: qty 1

## 2019-08-29 MED ORDER — METOCLOPRAMIDE HCL 5 MG/ML IJ SOLN
10.0000 mg | Freq: Once | INTRAMUSCULAR | Status: AC
  Administered 2019-08-29: 10 mg via INTRAMUSCULAR
  Filled 2019-08-29: qty 2

## 2019-08-29 NOTE — ED Notes (Signed)
Pt reports improvement in headache, 4/10

## 2019-08-29 NOTE — ED Triage Notes (Signed)
Pt reports migraine, ongoing, saw pmd Tuesday, prescribed imitrex, intermittent relief, is not followed by neurology.  Also reports nausea, sensitivity to light.

## 2019-08-29 NOTE — Discharge Instructions (Signed)
You were seen today for migraine.  I want you to follow-up with neurology, they should call you in a week however if you do not hear from them please call them.  Please come back to the emergency department for any new or worsening concerning symptoms as we spoke about including vision changes, weakness, numbness and tingling, worsening headache.  Continue to use the medications your primary care gave you.  As we talked about these headaches could be due to your hormones therefore I want you to follow-up with your primary care.  Please use the attached instructions.  I hope you feel better!

## 2019-08-29 NOTE — ED Provider Notes (Signed)
[facility] EMERGENCY DEPARTMENT Provider Note   CSN: [id number] Arrival date & time: 08/29/19  4481     History Chief Complaint  Patient presents with  . Migraine    Gary Sullivan is a 44 y.o. adult with pertinent past medical history of fibromyalgia, gender dysphoria, IBS, hypertension that presents emergency department today for headache.  Patient states that she has had migraines in the past, feels similar.  States that she was seen 10 days ago here in the emergency department where she had a migraine, was given migraine cocktail and migraine did resolve.  Came back a couple days ago, did see PCP who ordered outpatient MRI for tomorrow.  Patient states that headache is mainly in the front of his head, does not radiate.  No real triggers, is not worse with exertion.  Is not positional.  Does admit to photophobia and nausea and vomiting. No aura.  States that this normally occurs when he does have migraines.  Nonbilious no hematemesis.  No diarrhea or abdominal pain.  No vision changes, blurry vision, neck pain, fevers, chills, weight changes.  Was prescribed Imitrex, states that it has not really been working.  States that she came in for migraine cocktail today since that did work last time.  Denies any back pain, chest pain, shortness of breath, URI-like symptoms.  Denies any weakness, numbness, tingling, dizziness, lightheadedness.  Denies any alcohol or IV drug use.  No head trauma.  Patient is currently receiving hormone therapy for transgender transformation, has been having migraines since he started this therapy. HPI     Past Medical History:  Diagnosis Date  . Asthma   . Elevated heart rate with elevated blood pressure and diagnosis of hypertension   . Environmental allergies   . Fibromyalgia   . Gender dysphoria   . GERD (gastroesophageal reflux disease)   . High cholesterol   . History of prediabetes 06/04/2019  . Hyperhidrosis   . Hypertension   . IBS  (irritable bowel syndrome)   . Transgender 04/21/2019    Patient Active Problem List   Diagnosis Date Noted  . History of prediabetes 06/04/2019  . Transgender 04/21/2019  . Carpal tunnel syndrome on right 10/07/2018  . OSA (obstructive sleep apnea) 06/19/2018  . Excessive daytime sleepiness 06/19/2018  . Numbness 05/28/2018  . Tremor 05/28/2018  . Fibromyalgia 05/28/2018  . Depression with anxiety 05/28/2018  . High cholesterol 01/02/2012  . High blood pressure 01/02/2008  . IBS (irritable bowel syndrome) 01/01/2006  . GERD (gastroesophageal reflux disease) 01/01/2005  . Social anxiety disorder 01/02/1988  . Gender dysphoria 01/01/1985  . Asthma 01/01/1982    Past Surgical History:  Procedure Laterality Date  . CARPAL TUNNEL RELEASE Right 10/08/2018   Procedure: RIGHT CARPAL TUNNEL RELEASE;  Surgeon: Tarry Kos, MD;  Location: [facility];  Service: Orthopedics;  Laterality: Right;  . CHOLECYSTECTOMY    . labrum repair    . NASAL SEPTUM SURGERY    . SHOULDER ARTHROSCOPY WITH SUBACROMIAL DECOMPRESSION Left   . TONSILLECTOMY         Family History  Problem Relation Age of Onset  . High blood pressure Mother   . High blood pressure Father   . High blood pressure Brother   . Pancreatic cancer Maternal Grandfather   . Lung cancer Paternal Grandmother   . Diabetes Paternal Grandfather     Social History   Tobacco Use  . Smoking status: Former Smoker    Types: Cigarettes  .  Smokeless tobacco: Never Used  Vaping Use  . Vaping Use: Never used  Substance Use Topics  . Alcohol use: Not Currently  . Drug use: Not Currently    Home Medications Prior to Admission medications   Medication Sig Start Date End Date Taking? Authorizing Provider  amitriptyline (ELAVIL) 25 MG tablet Take 25 mg by mouth at bedtime.    [provider]  atenolol (TENORMIN) 50 MG tablet Take 1 tablet (50 mg total) by mouth daily. 05/21/19   [name], DO    azelastine (ASTELIN) 0.1 % nasal spray  11/03/18   [provider]  buPROPion 450 MG TB24 Take 450 mg by mouth every morning. 04/21/19   [name], DO  Cholecalciferol (VITAMIN D3 PO) Take 50,000 Units by mouth once a week.    [provider]  cyclobenzaprine (FLEXERIL) 5 MG tablet Take 1 tablet (5 mg total) by mouth 3 (three) times daily as needed for muscle spasms. MILD/MODERATE SPASM 06/02/19   [name], DO  DEPO-ESTRADIOL 5 MG/ML injection Inject 0.8 mLs (4 mg total) into the muscle once a week. (SUBQ) 06/02/19   [name], DO  dexlansoprazole (DEXILANT) 60 MG capsule Take 1 capsule (60 mg total) by mouth daily. 05/28/19   [name], DO  dicyclomine (BENTYL) 20 MG tablet Take 2 tablets (40 mg total) by mouth 2 (two) times daily as needed for spasms. 05/21/19   [name], DO  DIGESTIVE ENZYMES PO Take 1 Dose by mouth daily. Digestive enzymes/probiotic    [provider]  divalproex (DEPAKOTE ER) 500 MG 24 hr tablet Take 2 tablets (1,000 mg total) by mouth daily for 5 days, THEN 1 tablet (500 mg total) daily for 5 days. 08/25/19 09/04/19  [name], DO  DULoxetine (CYMBALTA) 60 MG capsule Take 60 mg by mouth daily.    [provider]  Eluxadoline (VIBERZI) 75 MG TABS Take 75 mg by mouth in the morning and at bedtime. 06/02/19   [name], DO  fenofibrate 160 MG tablet Take 1 tablet (160 mg total) by mouth daily. 07/22/19   [name], DO  finasteride (PROPECIA) 1 MG tablet Take 1 mg by mouth daily.    [provider]  glycopyrrolate (ROBINUL) 1 MG tablet Take 2 tablets (2 mg total) by mouth 2 (two) times daily. 06/02/19   [name], DO  ibuprofen (ADVIL) 800 MG tablet Take 800 mg by mouth 3 (three) times daily. 04/16/19   [provider]  Insulin Pen Needle (B-D ULTRAFINE III SHORT PEN) 31G X 8 MM MISC USE AS DIRECTED 11/20/18   [provider]  lisinopril (ZESTRIL) 5 MG  tablet Take 1 tablet (5 mg total) by mouth daily. 06/18/19   [name], DO  meloxicam (MOBIC) 7.5 MG tablet Take 1 tablet (7.5 mg total) by mouth daily as needed for pain. 06/02/19   [name], DO  methocarbamol (ROBAXIN) 500 MG tablet Take 1 tablet (500 mg total) by mouth 2 (two) times daily as needed. SEVERE SPASMS 06/02/19   [name], DO  NEEDLE, DISP, 18 G (B-D HYPODERMIC NEEDLE 18GX1.5") 18G X 1-1/2" MISC Use 1 Needle free injection every 7 (seven) days To draw up estrogen 11/07/18   [provider]  Omega-3 1000 MG CAPS Take by mouth.    [provider]  ondansetron (ZOFRAN) 4 MG tablet Take 1-2 tablets (4-8 mg total) by mouth every 8 (eight) hours as needed for nausea or vomiting. 10/08/18   Tarry Kos, MD  primidone (MYSOLINE) 50 MG tablet Take 1 tablet (50 mg total) by mouth 2 (two) times daily. 06/02/19   [name], DO  Semaglutide,0.25 or 0.5MG /DOS, 2 MG/1.5ML SOPN Inject 0.5 mg into the skin once a week. 06/02/19   [name], DO  spironolactone (ALDACTONE) 50 MG tablet Take 1 tablet (50 mg total) by mouth 2 (two) times daily. 06/02/19   [name], DO  SUMAtriptan (IMITREX) 50 MG tablet Take 1 tablet (50 mg total) by mouth every 2 (two) hours as needed for migraine. May repeat in 2 hours if headache persists or recurs. Max 150 mg per 24 hours. 08/25/19   [name], DO    Allergies    Molds & smuts, Dog epithelium allergy skin test, Sulfamethoxazole-trimethoprim, Sulfa antibiotics, and Cat hair extract  Review of Systems   Review of Systems  Constitutional: Negative for chills, diaphoresis, fatigue and fever.  HENT: Negative for congestion, sore throat and trouble swallowing.   Eyes: Negative for pain and visual disturbance.  Respiratory: Negative for cough, shortness of breath and wheezing.   Cardiovascular: Negative for chest pain, palpitations and leg swelling.  Gastrointestinal: Negative for abdominal  distention, abdominal pain, diarrhea, nausea and vomiting.  Genitourinary: Negative for difficulty urinating.  Musculoskeletal: Negative for back pain, neck pain and neck stiffness.  Skin: Negative for pallor.  Neurological: Positive for headaches. Negative for dizziness, seizures, syncope, facial asymmetry, speech difficulty, weakness, light-headedness and numbness.  Psychiatric/Behavioral: Negative for confusion.    Physical Exam Updated Vital Signs BP 112/83 (BP Location: Right Arm)   Pulse 86   Temp 98.2 F (36.8 C) (Oral)   Resp 16   Ht 5\' 9"  (1.753 m)   Wt 132 kg   SpO2 99%   BMI 42.97 kg/m   Physical Exam Constitutional:      General: She is not in acute distress.    Appearance: Normal appearance. She is not ill-appearing, toxic-appearing or diaphoretic.  HENT:     Mouth/Throat:     Mouth: Mucous membranes are moist.     Pharynx: Oropharynx is clear.  Eyes:     General: No scleral icterus.    Extraocular Movements: Extraocular movements intact.     Pupils: Pupils are equal, round, and reactive to light.  Cardiovascular:     Rate and Rhythm: Normal rate and regular rhythm.     Pulses: Normal pulses.     Heart sounds: Normal heart sounds.  Pulmonary:     Effort: Pulmonary effort is normal. No respiratory distress.     Breath sounds: Normal breath sounds. No stridor. No wheezing, rhonchi or rales.  Chest:     Chest wall: No tenderness.  Abdominal:     General: Abdomen is flat. There is no distension.     Palpations: Abdomen is soft.     Tenderness: There is no abdominal tenderness. There is no guarding or rebound.  Musculoskeletal:        General: No swelling or tenderness. Normal range of motion.     Cervical back: Normal range of motion and neck supple. No rigidity.     Right lower leg: No edema.     Left lower leg: No edema.  Skin:    General: Skin is warm and dry.     Capillary Refill: Capillary refill takes less than 2 seconds.     Coloration: Skin is  not pale.  Neurological:     General: No focal deficit present.     Mental Status: She is alert  and oriented to person, place, and time.     Comments: Alert. Clear speech. No facial droop. CNIII-XII grossly intact. Bilateral upper and lower extremities' sensation grossly intact. 5/5 symmetric strength with grip strength and with plantar and dorsi flexion bilaterally. . Normal finger to nose bilaterally. Negative pronator drift. Negative Romberg sign. Gait is steady and intact    Psychiatric:        Mood and Affect: Mood normal.        Behavior: Behavior normal.     ED Results / Procedures / Treatments   Labs (all labs ordered are listed, but only abnormal results are displayed) Labs Reviewed - No data to display  EKG None  Radiology No results found.  Procedures Procedures (including critical care time)  Medications Ordered in ED Medications  metoCLOPramide (REGLAN) injection 10 mg (10 mg Intramuscular Given 08/29/19 1034)  diphenhydrAMINE (BENADRYL) injection 25 mg (25 mg Intramuscular Given 08/29/19 1029)  ketorolac (TORADOL) 30 MG/ML injection 30 mg (30 mg Intramuscular Given 08/29/19 1031)    ED Course  I have reviewed the triage vital signs and the nursing notes.  Pertinent labs & imaging results that were available during my care of the patient were reviewed by me and considered in my medical decision making (see chart for details).    MDM Rules/Calculators/A&P                          Aldean Jewettathan Riester is a 44 y.o. adult with pertinent past medical history of fibromyalgia, gender dysphoria, IBS, hypertension that presents emergency department today for headache.  Patient was seen here 10 days ago, was treated with migraine cocktail and she states that it did resolve at that time.  Did get blood work done at PCP 4 days ago with stable CBC and CMP, slightly elevated AST and ALT, patient is being evaluated for this with PCP.  No abdominal pain on exam.  Will treat with  migraine cocktail here today and reevaluate.  Patient with normal vitals and normal neuro exam.  Upon reassessment patient states that pain has improved, is now 4 out of 10.  Was a 10/10.  Repeat neuro exam normal.  Did discuss that patient could be having symptoms from hormone therapy, patient to follow-up with primary care.  Will also refer to neurologist at this time.  Patient to have MRI done outpatient tomorrow, no need for emergent MRI done today with normal neuro exam.  Patient in agreement.  Patient to be discharged.  Doubt need for further emergent work up at this time. I explained the diagnosis and have given explicit precautions to return to the ER including for any other new or worsening symptoms. The patient understands and accepts the medical plan as it's been dictated and I have answered their questions. Discharge instructions concerning home care and prescriptions have been given. The patient is STABLE and is discharged to home in good condition.   Final Clinical Impression(s) / ED Diagnoses Final diagnoses:  Intractable migraine without aura and without status migrainosus    Rx / DC Orders ED Discharge Orders         Ordered    Ambulatory referral to Neurology       Comments: An appointment is requested in approximately: 1 week   08/29/19 1243           Farrel Gordonatel, Dewana Ammirati, PA-C 08/29/19 1247    Milagros Lollykstra, Richard S, MD 08/31/19 [phone]

## 2019-08-29 NOTE — ED Notes (Signed)
ED Provider at bedside. 

## 2019-08-31 ENCOUNTER — Ambulatory Visit (INDEPENDENT_AMBULATORY_CARE_PROVIDER_SITE_OTHER): Payer: Medicare HMO

## 2019-08-31 ENCOUNTER — Other Ambulatory Visit: Payer: Self-pay

## 2019-08-31 DIAGNOSIS — R519 Headache, unspecified: Secondary | ICD-10-CM | POA: Diagnosis not present

## 2019-08-31 DIAGNOSIS — G8929 Other chronic pain: Secondary | ICD-10-CM | POA: Diagnosis not present

## 2019-08-31 DIAGNOSIS — H748X1 Other specified disorders of right middle ear and mastoid: Secondary | ICD-10-CM | POA: Diagnosis not present

## 2019-08-31 DIAGNOSIS — R9082 White matter disease, unspecified: Secondary | ICD-10-CM | POA: Diagnosis not present

## 2019-08-31 IMAGING — MR MR HEAD WO/W CM
12 series · 48 of 48 positions shown · IV contrast (gadavist)
Comparison: [DATE] MRI head.

CLINICAL DATA: Headache

EXAM:
MRI HEAD WITHOUT AND WITH CONTRAST
TECHNIQUE: Multiplanar, multiecho pulse sequences of the brain and surrounding
structures were obtained without and with intravenous contrast.
CONTRAST:  10mL GADAVIST GADOBUTROL 1 MMOL/ML IV SOLN

[Series 3: DWI · axial · 3.0mm · 1.20mm/px · z∈[-64,+98]mm · 3 of 55 slices shown (1 of 2)]
[im 1/55]
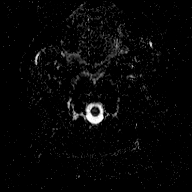
[im 28/55]
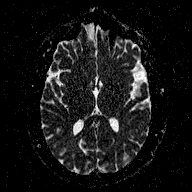
[im 55/55]
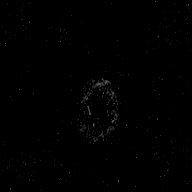

[Series 5: DWI · coronal · 3.0mm · 1.15mm/px · 2 of 49 slices shown (2 of 2)]
[im 1/49]
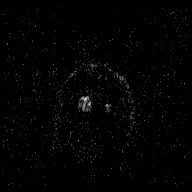
[im 49/49]
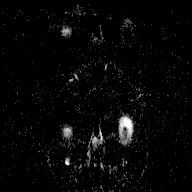

[Series 6: T1 · sagittal · 5.0mm · 0.45mm/px · 2 of 23 slices shown (1 of 2)]
[im 1/23]
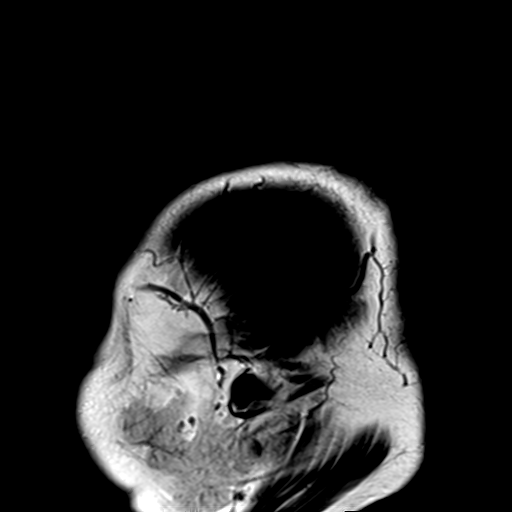
[im 23/23]
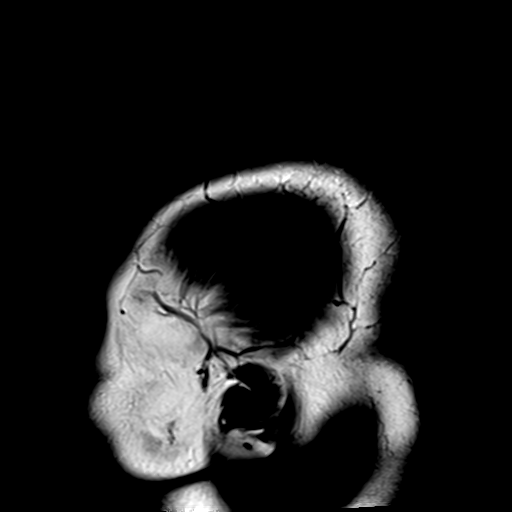

[Series 7: T2 · axial · 5.0mm · 0.72mm/px · z∈[-53,+108]mm · 2 of 24 slices shown (1 of 3)]
[im 1/24]
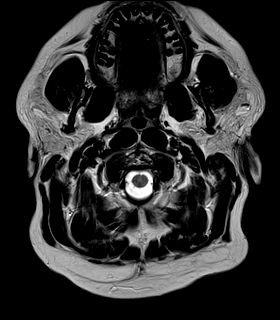
[im 24/24]
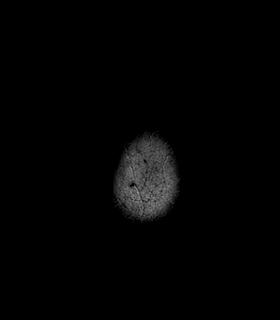

[Series 8: FLAIR · axial · 3.0mm · 0.45mm/px · z∈[-54,+108]mm · 4 of 55 slices shown]
[im 1/55]
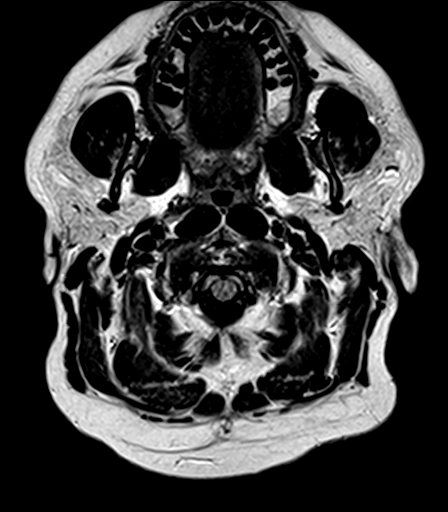
[im 19/55]
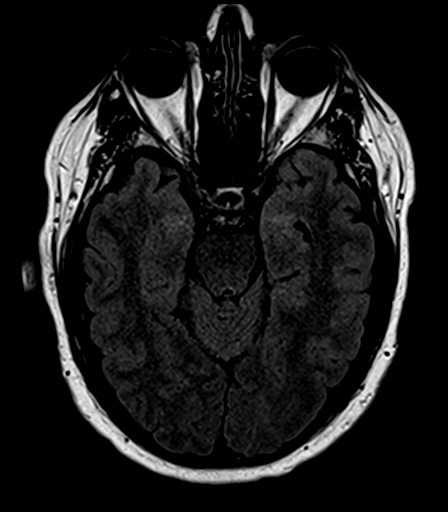
[im 37/55]
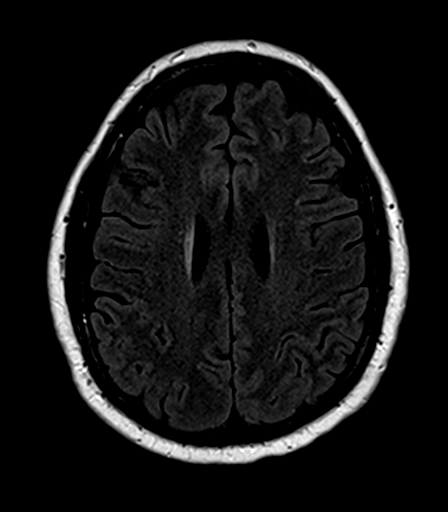
[im 55/55]
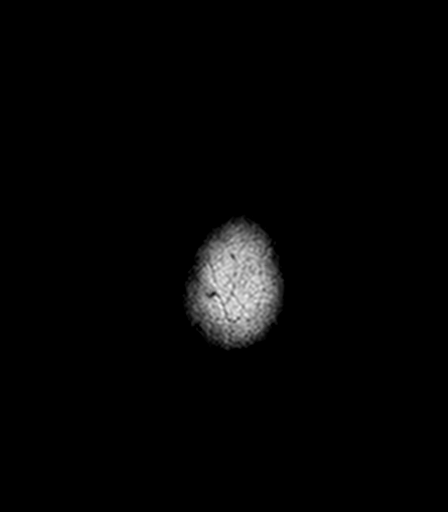

[Series 9: T2 · axial · 5.0mm · 0.72mm/px · z∈[-53,+108]mm · 2 of 24 slices shown (2 of 3)]
[im 1/24]
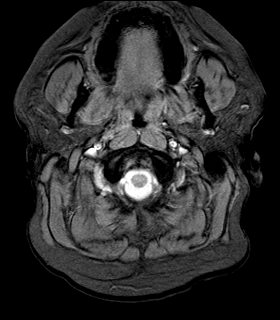
[im 24/24]
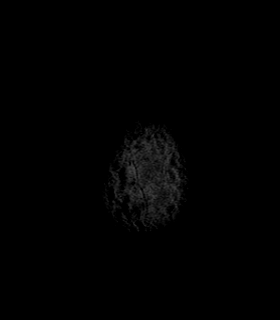

[Series 10: T1 · axial · 1.0mm · 1.00mm/px · z∈[-52,+107]mm · 11 of 160 slices shown (2 of 2)]
[im 1/160]
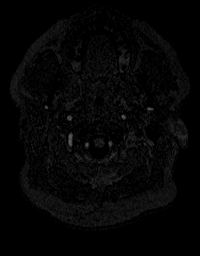
[im 16/160]
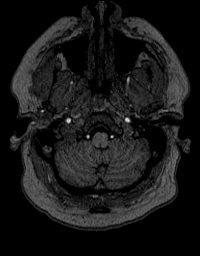
[im 32/160]
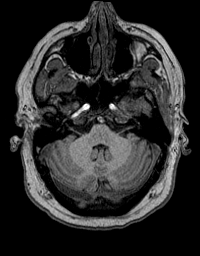
[im 48/160]
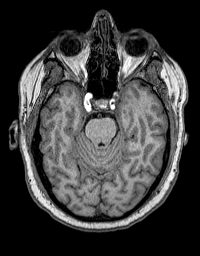
[im 64/160]
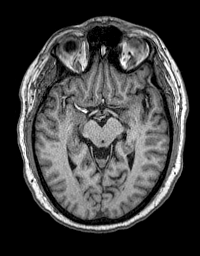
[im 80/160]
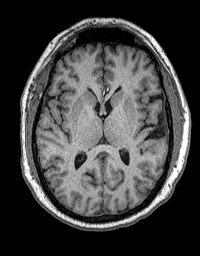
[im 96/160]
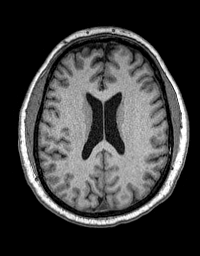
[im 112/160]
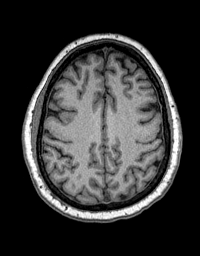
[im 128/160]
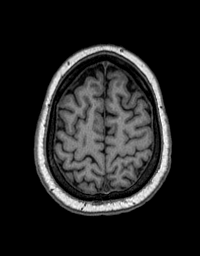
[im 144/160]
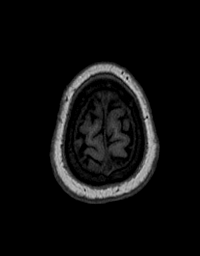
[im 160/160]
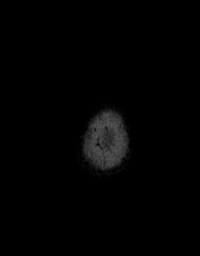

[Series 11: T2 · coronal · 5.0mm · 0.43mm/px · 2 of 29 slices shown (3 of 3)]
[im 1/29]
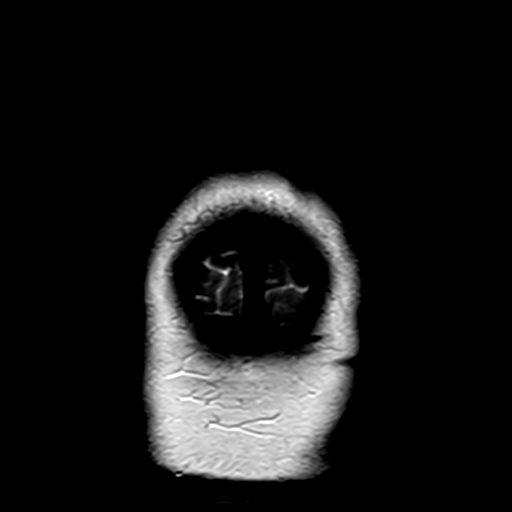
[im 29/29]
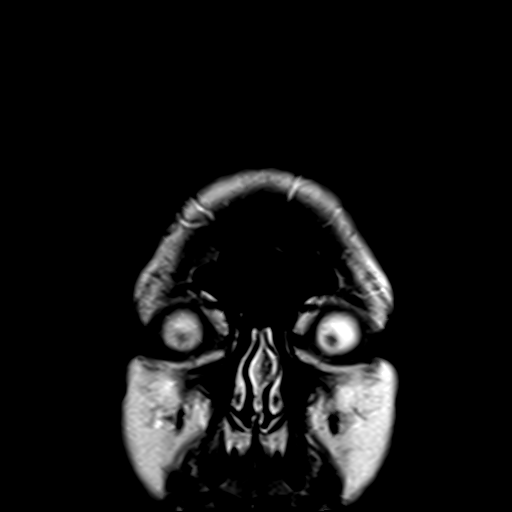

[Series 12: T1 post-contrast · axial · 1.0mm · 1.00mm/px · z∈[-52,+107]mm · 11 of 160 slices shown (1 of 2)]
[im 1/160]
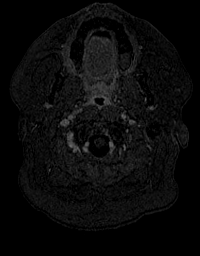
[im 16/160]
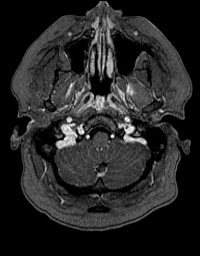
[im 32/160]
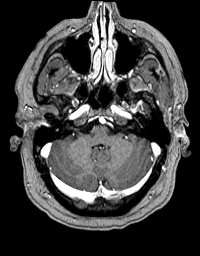
[im 48/160]
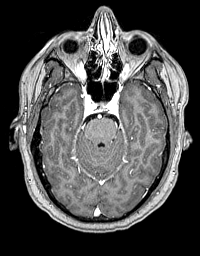
[im 64/160]
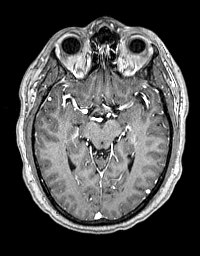
[im 80/160]
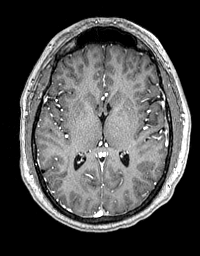
[im 96/160]
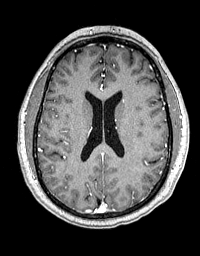
[im 112/160]
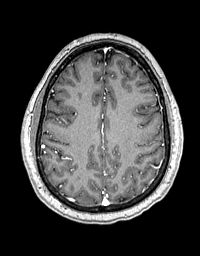
[im 128/160]
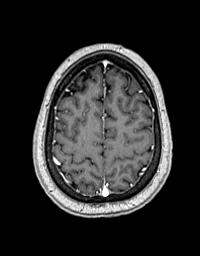
[im 144/160]
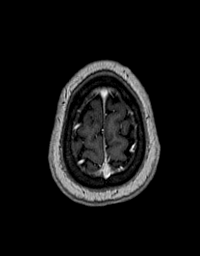
[im 160/160]
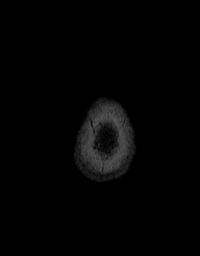

[Series 13: T1 post-contrast · coronal · 5.0mm · 0.43mm/px · 2 of 31 slices shown (2 of 2)]
[im 1/31]
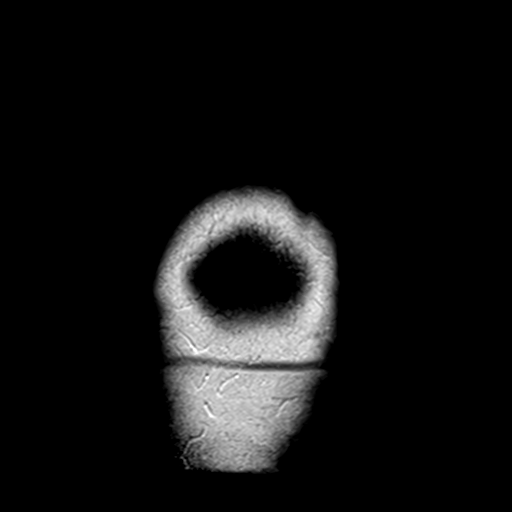
[im 31/31]
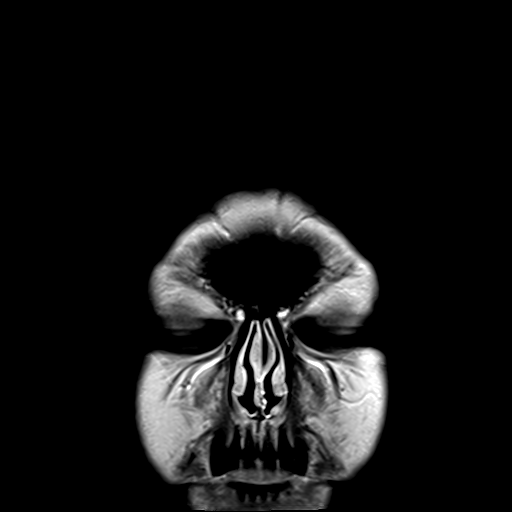

[Series 100: (id) ax · axial · 3.0mm · 1.20mm/px · z∈[-64,+98]mm · 4 of 55 slices shown]
[im 1/55]
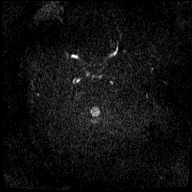
[im 19/55]
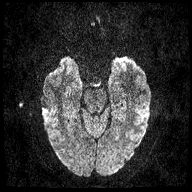
[im 37/55]
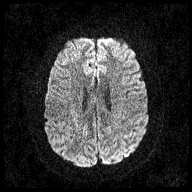
[im 55/55]
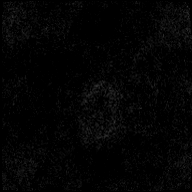

[Series 101: (id) cor · coronal · 3.0mm · 1.15mm/px · 3 of 49 slices shown]
[im 1/49]
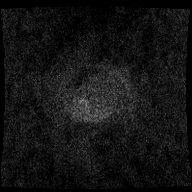
[im 25/49]
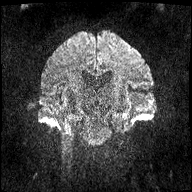
[im 49/49]
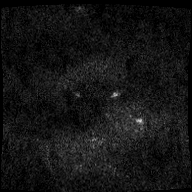

[48 of 48 positions shown; findings below may reference images not displayed]

FINDINGS: Brain: Few scattered FLAIR hyperintense foci involving the bilateral
frontoparietal white matter are grossly unchanged. No acute infarct
or intracranial hemorrhage. No midline shift, ventriculomegaly or
extra-axial fluid collection. No mass lesion. No abnormal
enhancement.

Vascular: Normal flow voids.

Skull and upper cervical spine: Normal marrow signal.

Sinuses/Orbits: Normal orbits. Clear paranasal sinuses. Trace right
mastoid effusion.

Other: None.
IMPRESSION: 1. No acute intracranial abnormality.
2. Scattered supratentorial white matter FLAIR hyperintense foci are
grossly unchanged. Findings are nonspecific and may reflect sequela
of chronic microvascular ischemic changes, migraines, post
infectious/inflammatory sequela or demyelination.

## 2019-08-31 MED ORDER — GADOBUTROL 1 MMOL/ML IV SOLN
10.0000 mL | Freq: Once | INTRAVENOUS | Status: AC | PRN
  Administered 2019-08-31: 10 mL via INTRAVENOUS

## 2019-09-01 ENCOUNTER — Ambulatory Visit (INDEPENDENT_AMBULATORY_CARE_PROVIDER_SITE_OTHER): Payer: Medicare HMO | Admitting: Osteopathic Medicine

## 2019-09-01 ENCOUNTER — Other Ambulatory Visit: Payer: Self-pay | Admitting: Osteopathic Medicine

## 2019-09-01 VITALS — BP 112/71 | HR 93 | Wt 290.0 lb

## 2019-09-01 DIAGNOSIS — G4489 Other headache syndrome: Secondary | ICD-10-CM

## 2019-09-01 MED ORDER — METOCLOPRAMIDE HCL 10 MG PO TABS: 10.0000 mg | ORAL_TABLET | Freq: Four times a day (QID) | ORAL | 1 refills | Status: DC | PRN

## 2019-09-01 MED ORDER — KETOROLAC TROMETHAMINE 10 MG PO TABS: 10.0000 mg | ORAL_TABLET | Freq: Four times a day (QID) | ORAL | 0 refills | Status: DC | PRN

## 2019-09-01 MED ORDER — DIPHENHYDRAMINE HCL 25 MG PO CAPS: 25.0000 mg | ORAL_CAPSULE | Freq: Four times a day (QID) | ORAL | 1 refills | Status: DC | PRN

## 2019-09-01 NOTE — Progress Notes (Signed)
HPI: Gary Sullivan is a 44 y.o. adult who  has a past medical history of Asthma, Elevated heart rate with elevated blood pressure and diagnosis of hypertension, Environmental allergies, Fibromyalgia, Gender dysphoria, GERD (gastroesophageal reflux disease), High cholesterol, History of prediabetes (06/04/2019), Hyperhidrosis, Hypertension, IBS (irritable bowel syndrome), and Transgender (04/21/2019).  she presents to [facility] today, 09/01/19,  for chief complaint of: headaches  Patient reports ongoing migraine headaches. She states she had migraines sporadically in the past (4-6x per year), but in the last few weeks she has had daily migraine headaches. She states the pain begins in the occipital area and then spreads bilaterally across the entire head and across the temples and forehead. She also endorses nausea, vomiting, and blurry vision with headaches. She states more recently, she has also felt generalized weakness and speech difficulty, stating it is difficult to put sentences together. She states the imitrex and depakote prescribed in this office decreased the pain slightly, but not much. She states the headache cocktails she's received in the ED subside the headaches for a few hours, but then they return. She states she has also tried mentholated "cool pads" on her forehead, which have helped.    Past medical, surgical, social and family history reviewed:  Patient Active Problem List   Diagnosis Date Noted  . History of prediabetes 06/04/2019  . Transgender 04/21/2019  . Carpal tunnel syndrome on right 10/07/2018  . OSA (obstructive sleep apnea) 06/19/2018  . Excessive daytime sleepiness 06/19/2018  . Numbness 05/28/2018  . Tremor 05/28/2018  . Fibromyalgia 05/28/2018  . Depression with anxiety 05/28/2018  . High cholesterol 01/02/2012  . High blood pressure 01/02/2008  . IBS (irritable bowel syndrome) 01/01/2006  . GERD (gastroesophageal  reflux disease) 01/01/2005  . Social anxiety disorder 01/02/1988  . Gender dysphoria 01/01/1985  . Asthma 01/01/1982    Past Surgical History:  Procedure Laterality Date  . CARPAL TUNNEL RELEASE Right 10/08/2018   Procedure: RIGHT CARPAL TUNNEL RELEASE;  Surgeon: Tarry Kos, MD;  Location: [facility];  Service: Orthopedics;  Laterality: Right;  . CHOLECYSTECTOMY    . labrum repair    . NASAL SEPTUM SURGERY    . SHOULDER ARTHROSCOPY WITH SUBACROMIAL DECOMPRESSION Left   . TONSILLECTOMY      Social History   Tobacco Use  . Smoking status: Former Smoker    Types: Cigarettes  . Smokeless tobacco: Never Used  Substance Use Topics  . Alcohol use: Not Currently    Family History  Problem Relation Age of Onset  . High blood pressure Mother   . High blood pressure Father   . High blood pressure Brother   . Pancreatic cancer Maternal Grandfather   . Lung cancer Paternal Grandmother   . Diabetes Paternal Grandfather      Current medication list and allergy/intolerance information reviewed:    Current Outpatient Medications  Medication Sig Dispense Refill  . amitriptyline (ELAVIL) 25 MG tablet Take 25 mg by mouth at bedtime.    Marland Kitchen atenolol (TENORMIN) 50 MG tablet Take 1 tablet (50 mg total) by mouth daily. 90 tablet 3  . azelastine (ASTELIN) 0.1 % nasal spray     . buPROPion 450 MG TB24 Take 450 mg by mouth every morning. 90 tablet 1  . Cholecalciferol (VITAMIN D3 PO) Take 50,000 Units by mouth once a week.    . cyclobenzaprine (FLEXERIL) 5 MG tablet Take 1 tablet (5 mg total) by mouth 3 (three) times daily as  needed for muscle spasms. MILD/MODERATE SPASM 90 tablet 1  . DEPO-ESTRADIOL 5 MG/ML injection Inject 0.8 mLs (4 mg total) into the muscle once a week. (SUBQ) 5 mL 1  . dexlansoprazole (DEXILANT) 60 MG capsule Take 1 capsule (60 mg total) by mouth daily. 90 capsule 3  . dicyclomine (BENTYL) 20 MG tablet Take 2 tablets (40 mg total) by mouth 2 (two) times  daily as needed for spasms. 360 tablet 3  . DIGESTIVE ENZYMES PO Take 1 Dose by mouth daily. Digestive enzymes/probiotic    . divalproex (DEPAKOTE ER) 500 MG 24 hr tablet Take 2 tablets (1,000 mg total) by mouth daily for 5 days, THEN 1 tablet (500 mg total) daily for 5 days. 15 tablet 0  . DULoxetine (CYMBALTA) 60 MG capsule Take 60 mg by mouth daily.    . Eluxadoline (VIBERZI) 75 MG TABS Take 75 mg by mouth in the morning and at bedtime. 60 tablet 2  . fenofibrate 160 MG tablet Take 1 tablet (160 mg total) by mouth daily. 30 tablet 3  . finasteride (PROPECIA) 1 MG tablet Take 1 mg by mouth daily.    Marland Kitchen. glycopyrrolate (ROBINUL) 1 MG tablet Take 2 tablets (2 mg total) by mouth 2 (two) times daily. 180 tablet 3  . ibuprofen (ADVIL) 800 MG tablet Take 800 mg by mouth 3 (three) times daily.    . Insulin Pen Needle (B-D ULTRAFINE III SHORT PEN) 31G X 8 MM MISC USE AS DIRECTED    . lisinopril (ZESTRIL) 5 MG tablet Take 1 tablet (5 mg total) by mouth daily. 90 tablet 3  . methocarbamol (ROBAXIN) 500 MG tablet Take 1 tablet (500 mg total) by mouth 2 (two) times daily as needed. SEVERE SPASMS 20 tablet 0  . NEEDLE, DISP, 18 G (B-D HYPODERMIC NEEDLE 18GX1.5") 18G X 1-1/2" MISC Use 1 Needle free injection every 7 (seven) days To draw up estrogen    . Omega-3 1000 MG CAPS Take by mouth.    . primidone (MYSOLINE) 50 MG tablet Take 1 tablet (50 mg total) by mouth 2 (two) times daily. 180 tablet 3  . Semaglutide,0.25 or 0.5MG /DOS, 2 MG/1.5ML SOPN Inject 0.5 mg into the skin once a week. 6 pen 5  . spironolactone (ALDACTONE) 50 MG tablet Take 1 tablet (50 mg total) by mouth 2 (two) times daily. 180 tablet 3  . SUMAtriptan (IMITREX) 50 MG tablet Take 1 tablet (50 mg total) by mouth every 2 (two) hours as needed for migraine. May repeat in 2 hours if headache persists or recurs. Max 150 mg per 24 hours. 10 tablet 0  . diphenhydrAMINE (BENADRYL) 25 mg capsule Take 1 capsule (25 mg total) by mouth every 6 (six) hours  as needed (headache). 30 capsule 1  . ketorolac (TORADOL) 10 MG tablet Take 1 tablet (10 mg total) by mouth every 6 (six) hours as needed (headache). 30 tablet 0  . metoCLOPramide (REGLAN) 10 MG tablet Take 1 tablet (10 mg total) by mouth every 6 (six) hours as needed (headache). 30 tablet 1   No current facility-administered medications for this visit.    Allergies  Allergen Reactions  . Molds & Smuts Cough, Hives, Itching and Shortness Of Breath  . Dog Epithelium Allergy Skin Test Itching and Other (See Comments)    Other reaction(s): Other (See Comments)   . Sulfamethoxazole-Trimethoprim Other (See Comments), Diarrhea, Nausea Only and Rash    Other reaction(s): Abdominal Pain   . Sulfa Antibiotics     Pain,  upset stomach, vomiting  . Cat Hair Extract Hives, Itching and Rash      Review of Systems:  Constitutional:  No  fever, no chills  HEENT: +  headache, + vision change  Cardiac: No  chest pain  Respiratory:  No  shortness of breath. No  Cough  Gastrointestinal: No  abdominal pain, +  nausea, +  vomiting  Musculoskeletal: No new myalgia/arthralgia  Skin: No  Rash, No other wounds/concerning lesions   Neurologic: +  weakness, No  dizziness, No  slurred speech/focal weakness/facial droop  Psychiatric: No  concerns with depression  Exam:  BP 112/71 (BP Location: Left Arm, Patient Position: Sitting)   Pulse 93   Wt 290 lb (131.5 kg)   SpO2 97%   BMI 42.83 kg/m   Constitutional: VS see above. General Appearance: alert, well-developed, well-nourished, NAD  Eyes: Normal lids and conjunctive, non-icteric sclera   Neck: No masses, trachea midline.  Respiratory: Normal respiratory effort. no wheeze, no rhonchi, no rales  Cardiovascular: S1/S2 normal, no murmur, no rub/gallop auscultated. RRR.  Gastrointestinal: Nontender, no masses.   Musculoskeletal: Gait normal. No clubbing/cyanosis of digits.   Neurological: Normal balance/coordination. No tremor. No  cranial nerve deficit on limited exam. EOMI,  Skin: warm, dry, intact. No rash/ulcer. No concerning nevi or subq nodules on limited exam.  Psychiatric: Normal judgment/insight. Normal mood and affect. Oriented x3.    No results found for this or any previous visit (from the past 72 hour(s)).  MR Brain W Wo Contrast  Result Date: 08/31/2019 CLINICAL DATA:  Headache EXAM: MRI HEAD WITHOUT AND WITH CONTRAST TECHNIQUE: Multiplanar, multiecho pulse sequences of the brain and surrounding structures were obtained without and with intravenous contrast. CONTRAST:  64mL GADAVIST GADOBUTROL 1 MMOL/ML IV SOLN COMPARISON:  07/23/2018 MRI head. FINDINGS: Brain: Few scattered FLAIR hyperintense foci involving the bilateral frontoparietal white matter are grossly unchanged. No acute infarct or intracranial hemorrhage. No midline shift, ventriculomegaly or extra-axial fluid collection. No mass lesion. No abnormal enhancement. Vascular: Normal flow voids. Skull and upper cervical spine: Normal marrow signal. Sinuses/Orbits: Normal orbits. Clear paranasal sinuses. Trace right mastoid effusion. Other: None. IMPRESSION: 1. No acute intracranial abnormality. 2. Scattered supratentorial white matter FLAIR hyperintense foci are grossly unchanged. Findings are nonspecific and may reflect sequela of chronic microvascular ischemic changes, migraines, post infectious/inflammatory sequela or demyelination. Electronically Signed   By: Stana Bunting M.D.   On: 08/31/2019 14:30     ASSESSMENT/PLAN: The encounter diagnosis was Other headache syndrome.   Chronic headaches  MRI yesterday shows no acute abnormalities and is unchanged from 07/2018.  ED placed referral to neurology two days ago. Will ask our referral coordinator to check on this, see how we can get him in somewhere ASAP   Imitrex and depakote did not significantly improve symptoms, so we will not refill these. Prescribed oral toradol, reglan, and benadryl,  which are the same components of the ED headache cocktail that has helped recently. Should hold him over until neurology evaluation   Orders Placed This Encounter  Procedures  . Ambulatory referral to Neurology    Meds ordered this encounter  Medications  . metoCLOPramide (REGLAN) 10 MG tablet    Sig: Take 1 tablet (10 mg total) by mouth every 6 (six) hours as needed (headache).    Dispense:  30 tablet    Refill:  1  . diphenhydrAMINE (BENADRYL) 25 mg capsule    Sig: Take 1 capsule (25 mg total) by mouth every 6 (six) hours  as needed (headache).    Dispense:  30 capsule    Refill:  1  . ketorolac (TORADOL) 10 MG tablet    Sig: Take 1 tablet (10 mg total) by mouth every 6 (six) hours as needed (headache).    Dispense:  30 tablet    Refill:  0        Visit summary with medication list and pertinent instructions was printed for patient to review. All questions at time of visit were answered - patient instructed to contact office with any additional concerns or updates. ER/RTC precautions were reviewed with the patient.   Please note: voice recognition software was used to produce this document, and typos may escape review. Please contact Dr. Lyn Hollingshead for any needed clarifications.     Follow-up plan: Return if symptoms worsen or fail to improve.  Rutha Bouchard, [facility] MS3

## 2019-09-03 ENCOUNTER — Encounter: Payer: Self-pay | Admitting: Neurology

## 2019-09-03 ENCOUNTER — Ambulatory Visit (INDEPENDENT_AMBULATORY_CARE_PROVIDER_SITE_OTHER): Payer: Medicare HMO | Admitting: Neurology

## 2019-09-03 ENCOUNTER — Other Ambulatory Visit: Payer: Self-pay

## 2019-09-03 VITALS — BP 105/75 | HR 94 | Ht 69.0 in | Wt 293.0 lb

## 2019-09-03 DIAGNOSIS — G43709 Chronic migraine without aura, not intractable, without status migrainosus: Secondary | ICD-10-CM

## 2019-09-03 DIAGNOSIS — G4733 Obstructive sleep apnea (adult) (pediatric): Secondary | ICD-10-CM

## 2019-09-03 DIAGNOSIS — R251 Tremor, unspecified: Secondary | ICD-10-CM | POA: Diagnosis not present

## 2019-09-03 DIAGNOSIS — F418 Other specified anxiety disorders: Secondary | ICD-10-CM

## 2019-09-03 DIAGNOSIS — IMO0002 Reserved for concepts with insufficient information to code with codable children: Secondary | ICD-10-CM

## 2019-09-03 DIAGNOSIS — Z789 Other specified health status: Secondary | ICD-10-CM

## 2019-09-03 DIAGNOSIS — R69 Illness, unspecified: Secondary | ICD-10-CM | POA: Diagnosis not present

## 2019-09-03 DIAGNOSIS — R200 Anesthesia of skin: Secondary | ICD-10-CM

## 2019-09-03 DIAGNOSIS — F640 Transsexualism: Secondary | ICD-10-CM

## 2019-09-03 MED ORDER — TOPIRAMATE 50 MG PO TABS: ORAL_TABLET | ORAL | 5 refills | Status: DC

## 2019-09-03 NOTE — Progress Notes (Signed)
[facility]  PATIENT: Gary Sullivan "[name]" DOB: Aug 21, 1975  REFERRING DOCTOR OR PCP: Baldo Ash, NP SOURCE: Patient, notes from primary care  _________________________________   HISTORICAL  CHIEF COMPLAINT:  Chief Complaint  Patient presents with   New Patient (Initial Visit)    RM 12, alone. Internal referral for headaches from [name], DO (PCP) Headaches have become more severe, lasting weeks instread of days. Has a current headache that has been present for weeks.Had MRI brain done 08/31/19 (in epic).     History of Present Illness: Gary Sullivan is a 44 year old transgender male with headaches.  I had previously seen her [place] at the time) for OSA and CTS.  She reports headaches started 2 1/2 weeks ago and mostly adjacent to the eyes bilaterally.   He feels vision and ability to read is worse since the headaches started.   She notes that the headache started when he was starting classes.   He had no trauma -- headache just started the one day.   With the headaches there is Nausea, some vomiting, photophobia and phonophobia.     In the past, she was having one or two migraines a month.    He has taken diphenhydramine, reglan and sumatriptan without much benefit.  Depakote was just started 08/25/2019.    She never got started on Auto-PAP.   PSG 07/23/2018 showed "Mild to moderate obstructive sleep apnea with an AHI = 13.6 (RDI = 14.5).   Sleep apnea was moderate during REM sleep with a REM AHI = 22.1."  He has not had too much recent umbness in his hands.   NCV/EMG study 07/29/2018 showed moderate right median neuropathy at the wrist (carpal tunnel syndrome).  Primidone had helped the essential tremor.    She has been told she is prediabetic and is on semaglutide.  Currently taking estrogen injections.   MRI brain 08/31/2019 report reads "Scattered supratentorial white matter FLAIR hyperintense foci are grossly unchanged.  Findings are nonspecific and may reflect sequela of chronic microvascular ischemic changes, migraines, post infectious/inflammatory sequela or demyelination"   I personally reviewed and see a small stable number of T2/FLAIR hyperintense foci most c/w chronic microvascular ischemic change or sequelae of migraine.   These are not typical for demyelination  REVIEW OF SYSTEMS: Constitutional: No fevers, chills, sweats, or change in appetite.  has insomnia Eyes: No visual changes, double vision, eye pain Ear, nose and throat: No hearing loss, ear pain, nasal congestion, sore throat Cardiovascular: No chest pain, palpitations Respiratory: No shortness of breath at rest or with exertion.   He has OSA and used to use a CPAP  GastrointestinaI: has IBS Genitourinary: No dysuria, urinary retention or frequency.  No nocturia. Musculoskeletal: No neck pain, back pain Integumentary: No rash, pruritus, skin lesions Neurological: as above Psychiatric: He has anxiety and depression Endocrine: No palpitations, diaphoresis, change in appetite, change in weigh or increased thirst Hematologic/Lymphatic: No anemia, purpura, petechiae. Allergic/Immunologic: No itchy/runny eyes, nasal congestion, recent allergic reactions, rashes  ALLERGIES: Allergies  Allergen Reactions   Molds & Smuts Cough, Hives, Itching and Shortness Of Breath   Dog Epithelium Allergy Skin Test Itching and Other (See Comments)    Other reaction(s): Other (See Comments)    Sulfamethoxazole-Trimethoprim Other (See Comments), Diarrhea, Nausea Only and Rash    Other reaction(s): Abdominal Pain    Sulfa Antibiotics     Pain, upset stomach, vomiting   Cat Hair Extract Hives, Itching and Rash  HOME MEDICATIONS:  Current Outpatient Medications:    amitriptyline (ELAVIL) 25 MG tablet, Take 25 mg by mouth at bedtime., Disp: , Rfl:    atenolol (TENORMIN) 50 MG tablet, Take 1 tablet (50 mg total) by mouth daily., Disp: 90 tablet,  Rfl: 3   azelastine (ASTELIN) 0.1 % nasal spray, , Disp: , Rfl:    buPROPion 450 MG TB24, Take 450 mg by mouth every morning., Disp: 90 tablet, Rfl: 1   Cholecalciferol (VITAMIN D3 PO), Take 50,000 Units by mouth once a week., Disp: , Rfl:    cyclobenzaprine (FLEXERIL) 5 MG tablet, Take 1 tablet (5 mg total) by mouth 3 (three) times daily as needed for muscle spasms. MILD/MODERATE SPASM, Disp: 90 tablet, Rfl: 1   DEPO-ESTRADIOL 5 MG/ML injection, Inject 0.8 mLs (4 mg total) into the muscle once a week. (SUBQ), Disp: 5 mL, Rfl: 1   dexlansoprazole (DEXILANT) 60 MG capsule, Take 1 capsule (60 mg total) by mouth daily., Disp: 90 capsule, Rfl: 3   dicyclomine (BENTYL) 20 MG tablet, Take 2 tablets (40 mg total) by mouth 2 (two) times daily as needed for spasms., Disp: 360 tablet, Rfl: 3   DIGESTIVE ENZYMES PO, Take 1 Dose by mouth daily. Digestive enzymes/probiotic, Disp: , Rfl:    diphenhydrAMINE (BENADRYL) 25 mg capsule, Take 1 capsule (25 mg total) by mouth every 6 (six) hours as needed (headache)., Disp: 30 capsule, Rfl: 1   DULoxetine (CYMBALTA) 60 MG capsule, Take 60 mg by mouth daily., Disp: , Rfl:    Eluxadoline (VIBERZI) 75 MG TABS, Take 75 mg by mouth in the morning and at bedtime., Disp: 60 tablet, Rfl: 2   fenofibrate 160 MG tablet, Take 1 tablet (160 mg total) by mouth daily., Disp: 30 tablet, Rfl: 3   finasteride (PROPECIA) 1 MG tablet, Take 1 mg by mouth daily., Disp: , Rfl:    glycopyrrolate (ROBINUL) 1 MG tablet, Take 2 tablets (2 mg total) by mouth 2 (two) times daily., Disp: 180 tablet, Rfl: 3   ibuprofen (ADVIL) 800 MG tablet, Take 800 mg by mouth 3 (three) times daily., Disp: , Rfl:    Insulin Pen Needle (B-D ULTRAFINE III SHORT PEN) 31G X 8 MM MISC, USE AS DIRECTED, Disp: , Rfl:    ketorolac (TORADOL) 10 MG tablet, Take 1 tablet (10 mg total) by mouth every 6 (six) hours as needed (headache)., Disp: 30 tablet, Rfl: 0   lisinopril (ZESTRIL) 5 MG tablet, Take 1  tablet (5 mg total) by mouth daily., Disp: 90 tablet, Rfl: 3   metoCLOPramide (REGLAN) 10 MG tablet, Take 1 tablet (10 mg total) by mouth every 6 (six) hours as needed (headache)., Disp: 30 tablet, Rfl: 1   NEEDLE, DISP, 18 G (B-D HYPODERMIC NEEDLE 18GX1.5") 18G X 1-1/2" MISC, Use 1 Needle free injection every 7 (seven) days To draw up estrogen, Disp: , Rfl:    Omega-3 1000 MG CAPS, Take by mouth., Disp: , Rfl:    primidone (MYSOLINE) 50 MG tablet, Take 1 tablet (50 mg total) by mouth 2 (two) times daily., Disp: 180 tablet, Rfl: 3   Semaglutide,0.25 or 0.5MG /DOS, 2 MG/1.5ML SOPN, Inject 0.5 mg into the skin once a week., Disp: 6 pen, Rfl: 5   spironolactone (ALDACTONE) 50 MG tablet, Take 1 tablet (50 mg total) by mouth 2 (two) times daily., Disp: 180 tablet, Rfl: 3   SUMAtriptan (IMITREX) 50 MG tablet, Take 1 tablet (50 mg total) by mouth every 2 (two) hours as needed for migraine.  May repeat in 2 hours if headache persists or recurs. Max 150 mg per 24 hours., Disp: 10 tablet, Rfl: 0   bicalutamide (CASODEX) 50 MG tablet, Take 50 mg by mouth daily. (Patient not taking: Reported on 09/03/2019), Disp: , Rfl:    topiramate (TOPAMAX) 50 MG tablet, One or two po qHS, Disp: 60 tablet, Rfl: 5  PAST MEDICAL HISTORY: Past Medical History:  Diagnosis Date   Asthma    Elevated heart rate with elevated blood pressure and diagnosis of hypertension    Environmental allergies    Fibromyalgia    Gender dysphoria    GERD (gastroesophageal reflux disease)    High cholesterol    History of prediabetes 06/04/2019   Hyperhidrosis    Hypertension    IBS (irritable bowel syndrome)    Transgender 04/21/2019    PAST SURGICAL HISTORY: Past Surgical History:  Procedure Laterality Date   CARPAL TUNNEL RELEASE Right 10/08/2018   Procedure: RIGHT CARPAL TUNNEL RELEASE;  Surgeon: Tarry KosXu, Naiping M, MD;  Location: [facility];  Service: Orthopedics;  Laterality: Right;   CHOLECYSTECTOMY      labrum repair     NASAL SEPTUM SURGERY     SHOULDER ARTHROSCOPY WITH SUBACROMIAL DECOMPRESSION Left    TONSILLECTOMY      FAMILY HISTORY: Family History  Problem Relation Age of Onset   High blood pressure Mother    High blood pressure Father    High blood pressure Brother    Pancreatic cancer Maternal Grandfather    Lung cancer Paternal Grandmother    Diabetes Paternal Grandfather     SOCIAL HISTORY:  Social History   Socioeconomic History   Marital status: Divorced    Spouse name: Not on file   Number of children: Not on file   Years of education: Not on file   Highest education level: Not on file  Occupational History   Not on file  Tobacco Use   Smoking status: Former Smoker    Types: Cigarettes   Smokeless tobacco: Never Used  Building services engineerVaping Use   Vaping Use: Never used  Substance and Sexual Activity   Alcohol use: Not Currently   Drug use: Not Currently   Sexual activity: Not Currently    Partners: Female  Other Topics Concern   Not on file  Social History Narrative   Right handed    Caffeine use: daily   Lives with brother, nephew and son   Social Determinants of Health   Financial Resource Strain:    Difficulty of Paying Living Expenses: Not on file  Food Insecurity:    Worried About Programme researcher, broadcasting/film/videounning Out of Food in the Last Year: Not on file   [employer] Financial[employer] Out of Food in the Last Year: Not on file  Transportation Needs:    Lack of Transportation (Medical): Not on file   Lack of Transportation (Non-Medical): Not on file  Physical Activity:    Days of Exercise per Week: Not on file   Minutes of Exercise per Session: Not on file  Stress:    Feeling of Stress : Not on file  Social Connections:    Frequency of Communication with Friends and Family: Not on file   Frequency of Social Gatherings with Friends and Family: Not on file   Attends Religious Services: Not on file   Active Member of Clubs or Organizations: Not on file   Attends  BankerClub or Organization Meetings: Not on file   Marital Status: Not on file  Intimate Partner Violence:  Fear of Current or Ex-Partner: Not on file   Emotionally Abused: Not on file   Physically Abused: Not on file   Sexually Abused: Not on file     PHYSICAL EXAM  Vitals:   09/03/19 1110  BP: 105/75  Pulse: 94  Weight: 293 lb (132.9 kg)  Height: 5\' 9"  (1.753 m)    Body mass index is 43.27 kg/m.   General: The patient is well-developed and well-nourished and in no acute distress  Eyes:  Funduscopic exam shows normal optic discs and retinal vessels.  Neck: The neck is supple, no carotid bruits are noted.  The neck is nontender.  Cardiovascular: The heart has a regular rate and rhythm with a normal S1 and S2. There were no murmurs, gallops or rubs.    Neurologic Exam  Mental status: The patient is alert and oriented x 3 at the time of the examination. The patient has apparent normal recent and remote memory, with an apparently normal attention span and concentration ability.   Speech is normal.  Cranial nerves: Extraocular movements are full. Pupils are equal, round, and reactive to light and accomodation.   Facial strength is normal.  Trapezius and sternocleidomastoid strength is normal. No dysarthria is noted.  The tongue is midline, and the patient has symmetric elevation of the soft palate. No obvious hearing deficits are noted.  Motor:  Muscle bulk is normal.   Tone is normal. Strength is  5 / 5 in all 4 extremities except for plus/5 right abductor pollicis brevis (median) strength.     Sensory: Symmetric sensation in arms and proximal legs    Coordination: Cerebellar testing reveals good finger-nose-finger and heel-to-shin bilaterally.  Gait and station: Station is normal.   Gait is normal. Tandem gait is normal. Romberg is negative.   Reflexes: Deep tendon reflexes are symmetric and normal bilaterally.       Chronic migraine  OSA (obstructive sleep  apnea)  Tremor  Numbness  Depression with anxiety  Transgender  1.  Toradol 60 mg IM x 1 (right deltoid) 2.   Emgality loading dose 120 mg x 2 samples provided.  --- if benefit can consider initiating monthly treatment 3.   Due to strong potential for weight gain will change from Depakote to topiramate 50-100 mg po qHS 4.   We will recontact a DME company to get AutoPap 5-20 started for OSA. 5.  Return as needed.  Dearion Huot A. , MD, [facility] 09/03/2019, 12:49 PM Certified in Neurology, Clinical Neurophysiology, Sleep Medicine, Pain Medicine and Neuroimaging  [facility] [address] [place], [place] [phone]

## 2019-09-09 ENCOUNTER — Other Ambulatory Visit: Payer: Self-pay | Admitting: Neurology

## 2019-09-09 ENCOUNTER — Encounter: Payer: Self-pay | Admitting: Osteopathic Medicine

## 2019-09-09 MED ORDER — CYCLOBENZAPRINE HCL 10 MG PO TABS: 10.0000 mg | ORAL_TABLET | Freq: Three times a day (TID) | ORAL | 3 refills | Status: DC | PRN

## 2019-09-14 ENCOUNTER — Other Ambulatory Visit: Payer: Self-pay | Admitting: Osteopathic Medicine

## 2019-09-14 DIAGNOSIS — R69 Illness, unspecified: Secondary | ICD-10-CM | POA: Diagnosis not present

## 2019-09-18 ENCOUNTER — Other Ambulatory Visit: Payer: Self-pay

## 2019-09-18 NOTE — Telephone Encounter (Signed)
Pt called requesting a med refill for Depo-estradiol. Pt stated that the pharmacy sent a refill request on 09/14/19. Rx pended.

## 2019-09-21 DIAGNOSIS — R69 Illness, unspecified: Secondary | ICD-10-CM | POA: Diagnosis not present

## 2019-09-22 MED ORDER — DEPO-ESTRADIOL 5 MG/ML IM OIL
4.0000 mg | TOPICAL_OIL | INTRAMUSCULAR | 1 refills | Status: DC
Start: 2019-09-22 — End: 2019-12-23

## 2019-09-28 ENCOUNTER — Telehealth: Payer: Self-pay | Admitting: Osteopathic Medicine

## 2019-09-28 NOTE — Telephone Encounter (Signed)
Received fax for PA on Ketorolac 10mg  sent through cover my meds waiting on determination. - CF

## 2019-10-05 DIAGNOSIS — R69 Illness, unspecified: Secondary | ICD-10-CM | POA: Diagnosis not present

## 2019-10-26 ENCOUNTER — Other Ambulatory Visit: Payer: Self-pay | Admitting: Osteopathic Medicine

## 2019-10-28 ENCOUNTER — Other Ambulatory Visit: Payer: Self-pay

## 2019-10-28 ENCOUNTER — Encounter: Payer: Self-pay | Admitting: Osteopathic Medicine

## 2019-10-28 MED ORDER — VIBERZI 75 MG PO TABS: ORAL_TABLET | ORAL | 1 refills | Status: DC

## 2019-11-02 ENCOUNTER — Encounter: Payer: Medicare HMO | Admitting: Osteopathic Medicine

## 2019-11-03 DIAGNOSIS — R69 Illness, unspecified: Secondary | ICD-10-CM | POA: Diagnosis not present

## 2019-11-09 ENCOUNTER — Ambulatory Visit (INDEPENDENT_AMBULATORY_CARE_PROVIDER_SITE_OTHER): Payer: Medicare HMO | Admitting: Osteopathic Medicine

## 2019-11-09 ENCOUNTER — Encounter: Payer: Self-pay | Admitting: Osteopathic Medicine

## 2019-11-09 ENCOUNTER — Other Ambulatory Visit: Payer: Self-pay | Admitting: Osteopathic Medicine

## 2019-11-09 VITALS — BP 100/64 | HR 91 | Temp 97.7°F | Wt 279.0 lb

## 2019-11-09 DIAGNOSIS — R1011 Right upper quadrant pain: Secondary | ICD-10-CM | POA: Diagnosis not present

## 2019-11-09 DIAGNOSIS — R748 Abnormal levels of other serum enzymes: Secondary | ICD-10-CM | POA: Diagnosis not present

## 2019-11-09 DIAGNOSIS — Z789 Other specified health status: Secondary | ICD-10-CM | POA: Diagnosis not present

## 2019-11-09 NOTE — Patient Instructions (Signed)
Labs and ultrasound Will update you once I have results available

## 2019-11-09 NOTE — Progress Notes (Signed)
Gary Sullivan is a 44 y.o. adult who presents to  [facility] at [facility]  today, 11/09/19, seeking care for the following:  . Abdominal pain - RUQ pain lasting 1-2 hours when taking Tylenol. Needing to take this for migraine recently. Has taken acetaminophen in the past without issue. (+)TTP on exam RUQ no rebound/guarding. BS WNL x4. CV RRR/S1S2 WNL. L CTABL. S/p cholecystectomy.  . Interested in pursing top and bottom surgery for gender affirming care      ASSESSMENT & PLAN with other pertinent findings:  The primary encounter diagnosis was RUQ pain. A diagnosis of Transgender was also pertinent to this visit.     Patient Instructions  Labs and ultrasound Will update you once I have results available   Will send information on surgeons who provide gender affirming care.    Orders Placed This Encounter  Procedures  . US ABDOMEN LIMITED RUQ (LIVER/GB)  . COMPLETE METABOLIC PANEL WITH GFR  . Gamma GT  . Amylase  . Lipase    No orders of the defined types were placed in this encounter.      Follow-up instructions: Return if symptoms worsen or fail to improve, for RECHECK PENDING RESULTS / IF WORSE OR CHANGE.                                         BP 100/64 (BP Location: Left Arm, Patient Position: Sitting, Cuff Size: Normal)   Pulse 91   Temp 97.7 F (36.5 C) (Oral)   Wt 279 lb 0.6 oz (126.6 kg)   BMI 41.21 kg/m   Current Meds  Medication Sig  . amitriptyline (ELAVIL) 25 MG tablet Take 25 mg by mouth at bedtime.  Marland Kitchen atenolol (TENORMIN) 50 MG tablet Take 1 tablet (50 mg total) by mouth daily.  Marland Kitchen azelastine (ASTELIN) 0.1 % nasal spray   . buPROPion 450 MG TB24 Take 450 mg by mouth every morning.  . Cholecalciferol (VITAMIN D3 PO) Take 50,000 Units by mouth once a week.  . cyclobenzaprine (FLEXERIL) 10 MG tablet Take 1 tablet (10 mg total) by mouth 3 (three) times daily as needed  for muscle spasms. MILD/MODERATE SPASM  . DEPO-ESTRADIOL 5 MG/ML injection Inject 0.8 mLs (4 mg total) into the muscle once a week. (SUBQ)  . dexlansoprazole (DEXILANT) 60 MG capsule Take 1 capsule (60 mg total) by mouth daily.  Marland Kitchen dicyclomine (BENTYL) 20 MG tablet Take 2 tablets (40 mg total) by mouth 2 (two) times daily as needed for spasms.  Marland Kitchen DIGESTIVE ENZYMES PO Take 1 Dose by mouth daily. Digestive enzymes/probiotic  . diphenhydrAMINE (BENADRYL) 25 mg capsule Take 1 capsule (25 mg total) by mouth every 6 (six) hours as needed (headache).  . DULoxetine (CYMBALTA) 60 MG capsule Take 60 mg by mouth daily.  . Eluxadoline (VIBERZI) 75 MG TABS TAKE 1 TABLET BY MOUTH IN THE MORNING AND AT BEDTIME  . fenofibrate 160 MG tablet Take 1 tablet (160 mg total) by mouth daily.  . finasteride (PROPECIA) 1 MG tablet Take 1 mg by mouth daily.  Marland Kitchen glycopyrrolate (ROBINUL) 1 MG tablet Take 2 tablets (2 mg total) by mouth 2 (two) times daily.  Marland Kitchen ibuprofen (ADVIL) 800 MG tablet Take 800 mg by mouth 3 (three) times daily.  . Insulin Pen Needle (B-D ULTRAFINE III SHORT PEN) 31G X 8 MM MISC USE AS DIRECTED  .  ketorolac (TORADOL) 10 MG tablet Take 1 tablet (10 mg total) by mouth every 6 (six) hours as needed (headache).  . lisinopril (ZESTRIL) 5 MG tablet Take 1 tablet (5 mg total) by mouth daily.  . metoCLOPramide (REGLAN) 10 MG tablet Take 1 tablet (10 mg total) by mouth every 6 (six) hours as needed (headache).  . NEEDLE, DISP, 18 G (B-D HYPODERMIC NEEDLE 18GX1.5") 18G X 1-1/2" MISC Use 1 Needle free injection every 7 (seven) days To draw up estrogen  . Omega-3 1000 MG CAPS Take by mouth.  . primidone (MYSOLINE) 50 MG tablet Take 1 tablet (50 mg total) by mouth 2 (two) times daily.  . Semaglutide,0.25 or 0.5MG /DOS, 2 MG/1.5ML SOPN Inject 0.5 mg into the skin once a week.  . spironolactone (ALDACTONE) 50 MG tablet Take 1 tablet (50 mg total) by mouth 2 (two) times daily.  . SUMAtriptan (IMITREX) 50 MG tablet Take 1  tablet (50 mg total) by mouth every 2 (two) hours as needed for migraine. May repeat in 2 hours if headache persists or recurs. Max 150 mg per 24 hours.  . topiramate (TOPAMAX) 50 MG tablet One or two po qHS    No results found for this or any previous visit (from the past 72 hour(s)).  No results found.     All questions at time of visit were answered - patient instructed to contact office with any additional concerns or updates.  ER/RTC precautions were reviewed with the patient as applicable.   Please note: voice recognition software was used to produce this document, and typos may escape review. Please contact Dr. Lyn Hollingshead for any needed clarifications.

## 2019-11-10 ENCOUNTER — Encounter: Payer: Medicare HMO | Admitting: Osteopathic Medicine

## 2019-11-10 ENCOUNTER — Ambulatory Visit (INDEPENDENT_AMBULATORY_CARE_PROVIDER_SITE_OTHER): Payer: Medicare HMO

## 2019-11-10 ENCOUNTER — Other Ambulatory Visit: Payer: Self-pay

## 2019-11-10 ENCOUNTER — Other Ambulatory Visit: Payer: Self-pay | Admitting: Osteopathic Medicine

## 2019-11-10 DIAGNOSIS — K760 Fatty (change of) liver, not elsewhere classified: Secondary | ICD-10-CM | POA: Diagnosis not present

## 2019-11-10 DIAGNOSIS — R7401 Elevation of levels of liver transaminase levels: Secondary | ICD-10-CM | POA: Diagnosis not present

## 2019-11-10 LAB — COMPLETE METABOLIC PANEL WITH GFR
AG Ratio: 1.8 (calc) (ref 1.0–2.5)
ALT: 16 U/L (ref 9–46)
AST: 19 U/L (ref 10–40)
Albumin: 4.2 g/dL (ref 3.6–5.1)
Alkaline phosphatase (APISO): 43 U/L (ref 36–130)
BUN: 15 mg/dL (ref 7–25)
CO2: 25 mmol/L (ref 20–32)
Calcium: 9.2 mg/dL (ref 8.6–10.3)
Chloride: 105 mmol/L (ref 98–110)
Creat: 1.32 mg/dL (ref 0.60–1.35)
GFR, Est African American: 77 mL/min/{1.73_m2} (ref >=60)
GFR, Est Non African American: 66 mL/min/{1.73_m2} (ref >=60)
Globulin: 2.3 g/dL (calc) (ref 1.9–3.7)
Glucose, Bld: 108 mg/dL (ref 65–139)
Potassium: 4.3 mmol/L (ref 3.5–5.3)
Sodium: 136 mmol/L (ref 135–146)
Total Bilirubin: 0.3 mg/dL (ref 0.2–1.2)
Total Protein: 6.5 g/dL (ref 6.1–8.1)

## 2019-11-10 LAB — GAMMA GT: GGT: 23 U/L (ref 3–95)

## 2019-11-10 LAB — LIPASE: Lipase: 29 U/L (ref 7–60)

## 2019-11-10 LAB — AMYLASE: Amylase: 34 U/L (ref 21–101)

## 2019-11-10 IMAGING — US US ABDOMEN LIMITED
1 series · 14 of 25 positions shown · non-contrast
Comparison: None.

CLINICAL DATA: This is abnormal liver enzymes

EXAM:
ULTRASOUND ABDOMEN LIMITED RIGHT UPPER QUADRANT

[Series 1: us abdomen limited · 0.24mm/px · 14 of 42 slices shown]
[im 1/42]
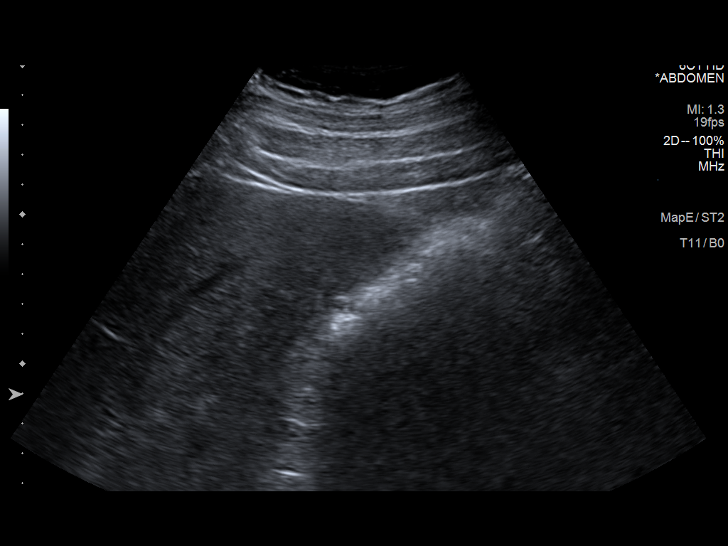
[im 4/42]
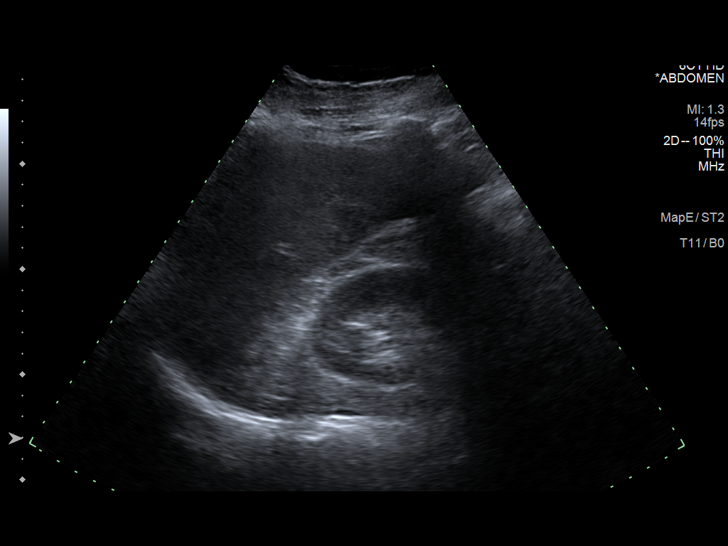
[im 7/42]
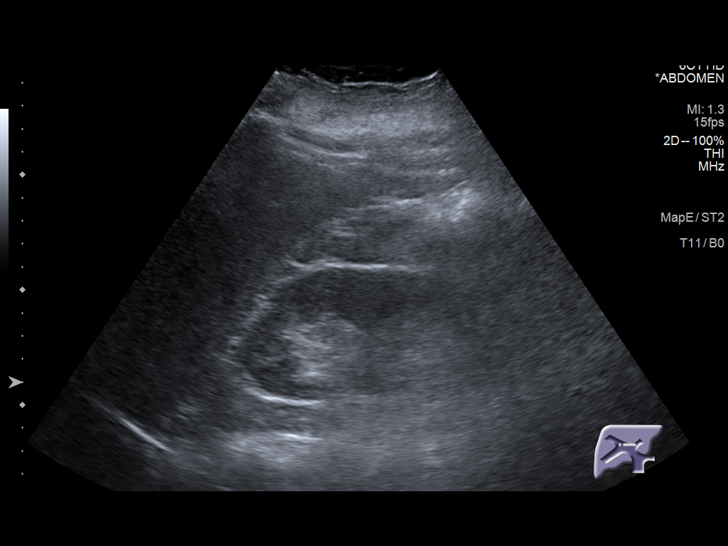
[im 11/42]
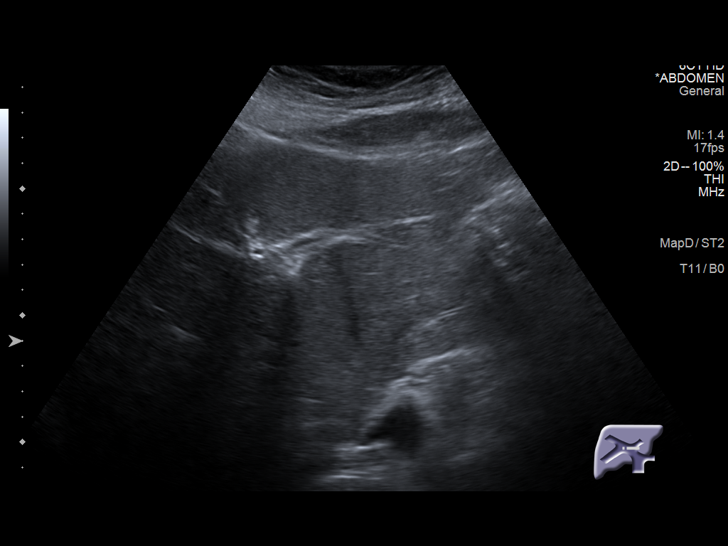
[im 14/42]
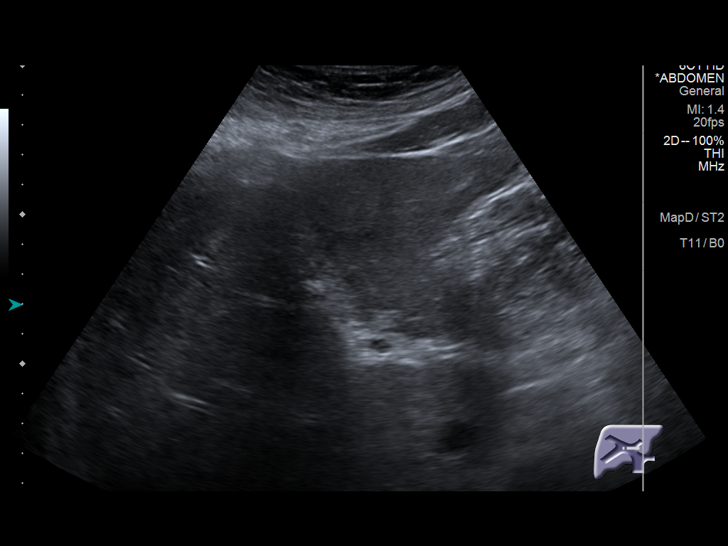
[im 16/42]
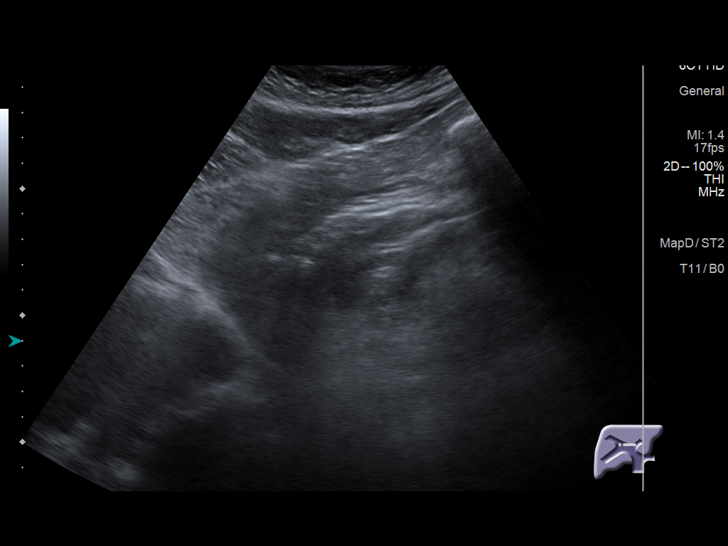
[im 19/42]
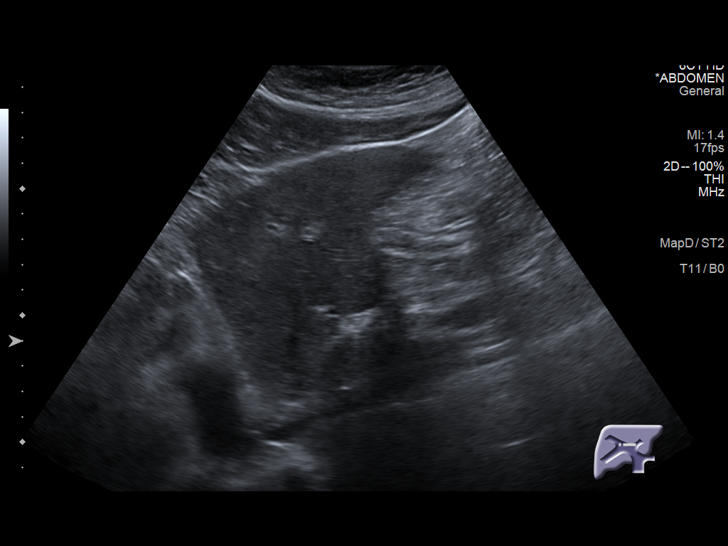
[im 23/42]
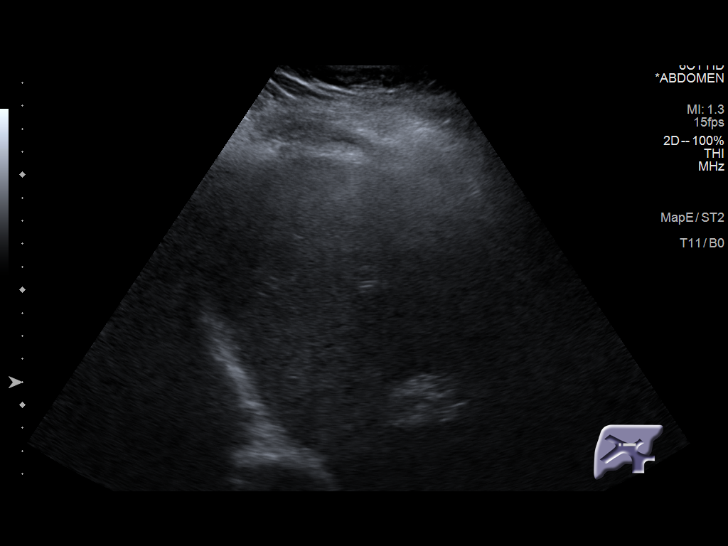
[im 26/42]
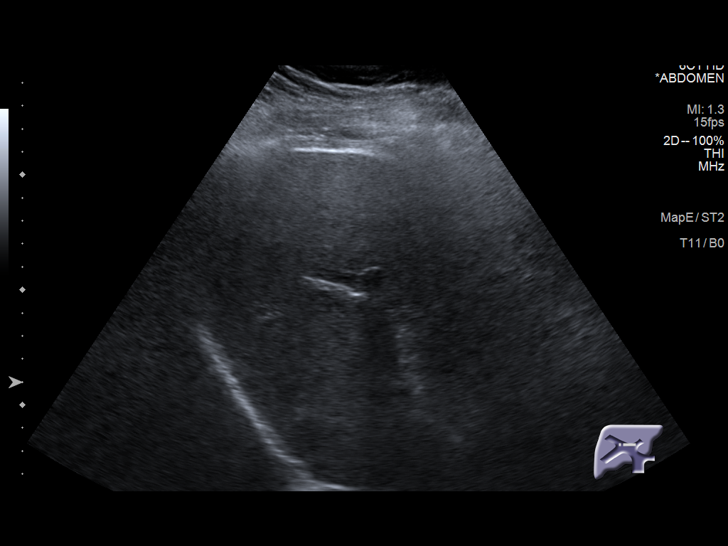
[im 28/42]
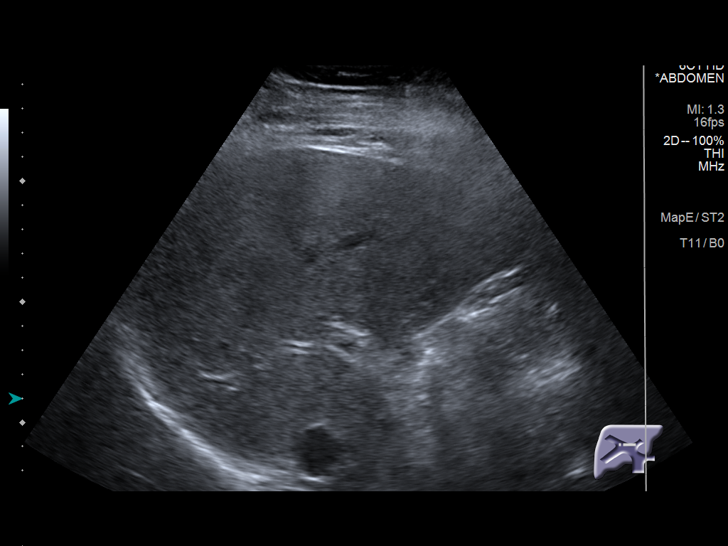
[im 31/42]
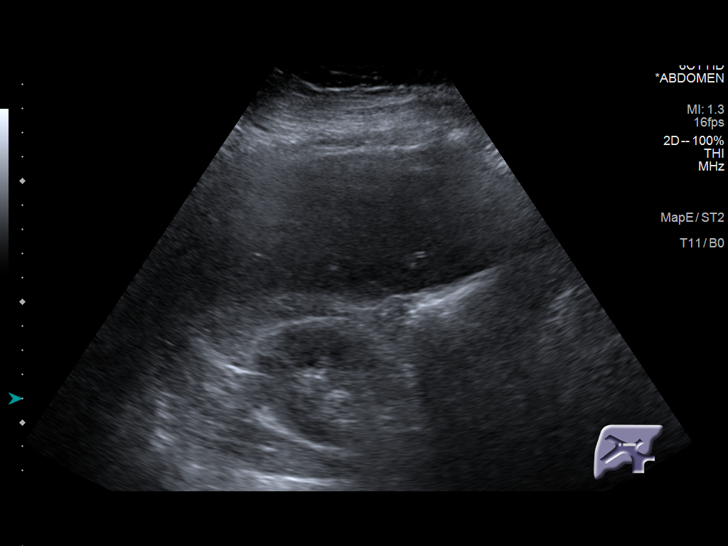
[im 35/42]
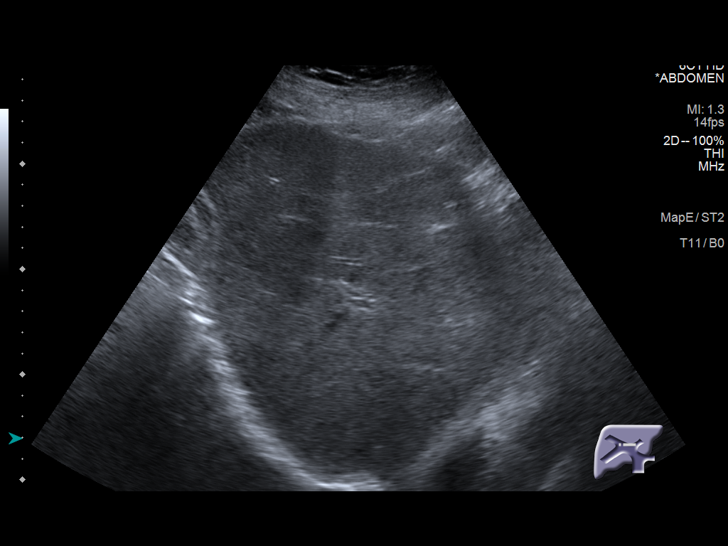
[im 38/42]
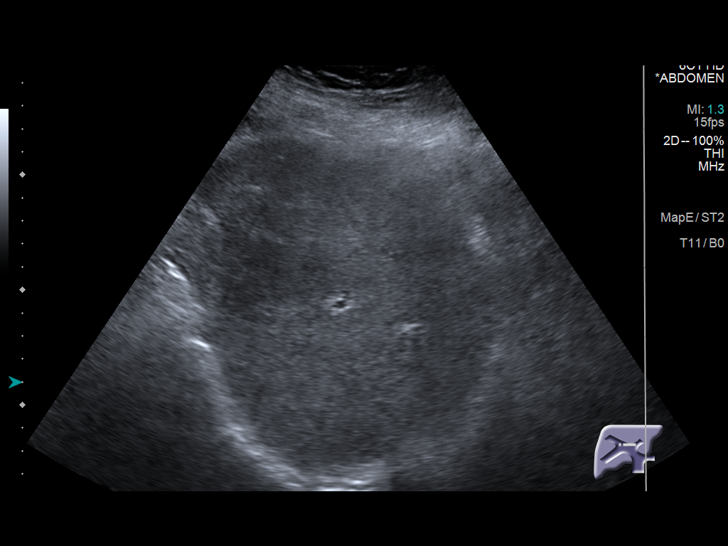
[im 42/42]
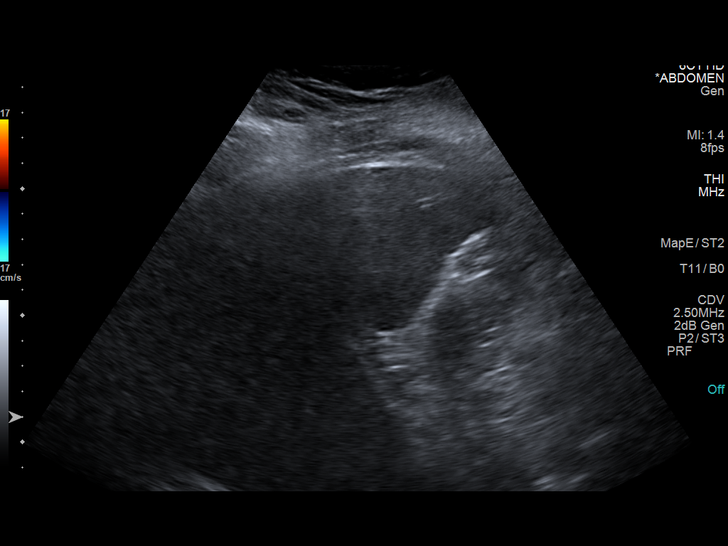

[14 of 25 positions shown; findings below may reference images not displayed]

FINDINGS: Gallbladder:

Surgically absent

Common bile duct:

Diameter: 4 mm

Liver:

Diffuse increased echogenicity with slightly heterogeneous liver.
Appearance typically secondary to fatty infiltration. Fibrosis
secondary consideration. No secondary findings of cirrhosis noted.
No focal hepatic lesion or intrahepatic biliary duct dilatation.
Portal vein is patent on color Doppler imaging with normal direction
of blood flow towards the liver.

Other: None.
IMPRESSION: 1. Status post cholecystectomy.
2. Hepatic steatosis.

## 2019-11-11 ENCOUNTER — Encounter: Payer: Self-pay | Admitting: Osteopathic Medicine

## 2019-11-11 DIAGNOSIS — K760 Fatty (change of) liver, not elsewhere classified: Secondary | ICD-10-CM

## 2019-11-11 DIAGNOSIS — K588 Other irritable bowel syndrome: Secondary | ICD-10-CM

## 2019-11-11 DIAGNOSIS — R1011 Right upper quadrant pain: Secondary | ICD-10-CM

## 2019-11-13 ENCOUNTER — Encounter: Payer: Self-pay | Admitting: Gastroenterology

## 2019-12-01 DIAGNOSIS — R69 Illness, unspecified: Secondary | ICD-10-CM | POA: Diagnosis not present

## 2019-12-07 DIAGNOSIS — R69 Illness, unspecified: Secondary | ICD-10-CM | POA: Diagnosis not present

## 2019-12-21 ENCOUNTER — Encounter: Payer: Self-pay | Admitting: Osteopathic Medicine

## 2019-12-22 ENCOUNTER — Ambulatory Visit: Payer: Medicare HMO | Admitting: Gastroenterology

## 2019-12-23 ENCOUNTER — Encounter: Payer: Self-pay | Admitting: Osteopathic Medicine

## 2019-12-23 MED ORDER — DEPO-ESTRADIOL 5 MG/ML IM OIL: 4.0000 mg | TOPICAL_OIL | INTRAMUSCULAR | 1 refills | Status: DC

## 2019-12-23 NOTE — Telephone Encounter (Signed)
Rx pended

## 2019-12-31 DIAGNOSIS — R69 Illness, unspecified: Secondary | ICD-10-CM | POA: Diagnosis not present

## 2020-01-06 DIAGNOSIS — R69 Illness, unspecified: Secondary | ICD-10-CM | POA: Diagnosis not present

## 2020-01-11 ENCOUNTER — Ambulatory Visit (INDEPENDENT_AMBULATORY_CARE_PROVIDER_SITE_OTHER): Payer: Medicare HMO | Admitting: Osteopathic Medicine

## 2020-01-11 DIAGNOSIS — Z0000 Encounter for general adult medical examination without abnormal findings: Secondary | ICD-10-CM | POA: Diagnosis not present

## 2020-01-11 NOTE — Patient Instructions (Addendum)
 Fall Prevention in the Home, Adult Falls can cause injuries and can happen to people of all ages. There are many things you can do to make your home safe and to help prevent falls. Ask for help when making these changes. What actions can I take to prevent falls? General Instructions  Use good lighting in all rooms. Replace any light bulbs that burn out.  Turn on the lights in dark areas. Use night-lights.  Keep items that you use often in easy-to-reach places. Lower the shelves around your home if needed.  Set up your furniture so you have a clear path. Avoid moving your furniture around.  Do not have throw rugs or other things on the floor that can make you trip.  Avoid walking on wet floors.  If any of your floors are uneven, fix them.  Add color or contrast paint or tape to clearly mark and help you see: ? Grab bars or handrails. ? First and last steps of staircases. ? Where the edge of each step is.  If you use a stepladder: ? Make sure that it is fully opened. Do not climb a closed stepladder. ? Make sure the sides of the stepladder are locked in place. ? Ask someone to hold the stepladder while you use it.  Know where your pets are when moving through your home. What can I do in the bathroom?  Keep the floor dry. Clean up any water on the floor right away.  Remove soap buildup in the tub or shower.  Use nonskid mats or decals on the floor of the tub or shower.  Attach bath mats securely with double-sided, nonslip rug tape.  If you need to sit down in the shower, use a plastic, nonslip stool.  Install grab bars by the toilet and in the tub and shower. Do not use towel bars as grab bars.      What can I do in the bedroom?  Make sure that you have a light by your bed that is easy to reach.  Do not use any sheets or blankets for your bed that hang to the floor.  Have a firm chair with side arms that you can use for support when you get dressed. What can I do  in the kitchen?  Clean up any spills right away.  If you need to reach something above you, use a step stool with a grab bar.  Keep electrical cords out of the way.  Do not use floor polish or wax that makes floors slippery. What can I do with my stairs?  Do not leave any items on the stairs.  Make sure that you have a light switch at the top and the bottom of the stairs.  Make sure that there are handrails on both sides of the stairs. Fix handrails that are broken or loose.  Install nonslip stair treads on all your stairs.  Avoid having throw rugs at the top or bottom of the stairs.  Choose a carpet that does not hide the edge of the steps on the stairs.  Check carpeting to make sure that it is firmly attached to the stairs. Fix carpet that is loose or worn. What can I do on the outside of my home?  Use bright outdoor lighting.  Fix the edges of walkways and driveways and fix any cracks.  Remove anything that might make you trip as you walk through a door, such as a raised step or threshold.  Trim   any bushes or trees on paths to your home.  Check to see if handrails are loose or broken and that both sides of all steps have handrails.  Install guardrails along the edges of any raised decks and porches.  Clear paths of anything that can make you trip, such as tools or rocks.  Have leaves, snow, or ice cleared regularly.  Use sand or salt on paths during winter.  Clean up any spills in your garage right away. This includes grease or oil spills. What other actions can I take?  Wear shoes that: ? Have a low heel. Do not wear high heels. ? Have rubber bottoms. ? Feel good on your feet and fit well. ? Are closed at the toe. Do not wear open-toe sandals.  Use tools that help you move around if needed. These include: ? Canes. ? Walkers. ? Scooters. ? Crutches.  Review your medicines with your doctor. Some medicines can make you feel dizzy. This can increase your  chance of falling. Ask your doctor what else you can do to help prevent falls. Where to find more information  Centers for Disease Control and Prevention, [employer]: www.cdc.gov  National Institute on Aging: www.nia.nih.gov Contact a doctor if:  You are afraid of falling at home.  You feel weak, drowsy, or dizzy at home.  You fall at home. Summary  There are many simple things that you can do to make your home safe and to help prevent falls.  Ways to make your home safe include removing things that can make you trip and installing grab bars in the bathroom.  Ask for help when making these changes in your home. This information is not intended to replace advice given to you by your health care provider. Make sure you discuss any questions you have with your health care provider. Document Revised: 07/22/2019 Document Reviewed: 07/22/2019 Elsevier Patient Education  2021 Elsevier Inc.   Health Maintenance, Male Adopting a healthy lifestyle and getting preventive care are important in promoting health and wellness. Ask your health care provider about:  The right schedule for you to have regular tests and exams.  Things you can do on your own to prevent diseases and keep yourself healthy. What should I know about diet, weight, and exercise? Eat a healthy diet  Eat a diet that includes plenty of vegetables, fruits, low-fat dairy products, and lean protein.  Do not eat a lot of foods that are high in solid fats, added sugars, or sodium.   Maintain a healthy weight Body mass index (BMI) is used to identify weight problems. It estimates body fat based on height and weight. Your health care provider can help determine your BMI and help you achieve or maintain a healthy weight. Get regular exercise Get regular exercise. This is one of the most important things you can do for your health. Most adults should:  Exercise for at least 150 minutes each week. The exercise should increase  your heart rate and make you sweat (moderate-intensity exercise).  Do strengthening exercises at least twice a week. This is in addition to the moderate-intensity exercise.  Spend less time sitting. Even light physical activity can be beneficial. Watch cholesterol and blood lipids Have your blood tested for lipids and cholesterol at 43 years of age, then have this test every 5 years. Have your cholesterol levels checked more often if:  Your lipid or cholesterol levels are high.  You are older than 43 years of age.  You are at   high risk for heart disease. What should I know about cancer screening? Depending on your health history and family history, you may need to have cancer screening at various ages. This may include screening for:  Breast cancer.  Cervical cancer.  Colorectal cancer.  Skin cancer.  Lung cancer. What should I know about heart disease, diabetes, and high blood pressure? Blood pressure and heart disease  High blood pressure causes heart disease and increases the risk of stroke. This is more likely to develop in people who have high blood pressure readings, are of African descent, or are overweight.  Have your blood pressure checked: ? Every 3-5 years if you are 18-39 years of age. ? Every year if you are 40 years old or older. Diabetes Have regular diabetes screenings. This checks your fasting blood sugar level. Have the screening done:  Once every three years after age 40 if you are at a normal weight and have a low risk for diabetes.  More often and at a younger age if you are overweight or have a high risk for diabetes. What should I know about preventing infection? Hepatitis B If you have a higher risk for hepatitis B, you should be screened for this virus. Talk with your health care provider to find out if you are at risk for hepatitis B infection. Hepatitis C Testing is recommended for:  Everyone born from 1945 through 1965.  Anyone with known  risk factors for hepatitis C. Sexually transmitted infections (STIs)  Get screened for STIs, including gonorrhea and chlamydia, if: ? You are sexually active and are younger than 43 years of age. ? You are older than 43 years of age and your health care provider tells you that you are at risk for this type of infection. ? Your sexual activity has changed since you were last screened, and you are at increased risk for chlamydia or gonorrhea. Ask your health care provider if you are at risk.  Ask your health care provider about whether you are at high risk for HIV. Your health care provider may recommend a prescription medicine to help prevent HIV infection. If you choose to take medicine to prevent HIV, you should first get tested for HIV. You should then be tested every 3 months for as long as you are taking the medicine. Pregnancy  If you are about to stop having your period (premenopausal) and you may become pregnant, seek counseling before you get pregnant.  Take 400 to 800 micrograms (mcg) of folic acid every day if you become pregnant.  Ask for birth control (contraception) if you want to prevent pregnancy. Osteoporosis and menopause Osteoporosis is a disease in which the bones lose minerals and strength with aging. This can result in bone fractures. If you are 65 years old or older, or if you are at risk for osteoporosis and fractures, ask your health care provider if you should:  Be screened for bone loss.  Take a calcium or vitamin D supplement to lower your risk of fractures.  Be given hormone replacement therapy (HRT) to treat symptoms of menopause. Follow these instructions at home: Lifestyle  Do not use any products that contain nicotine or tobacco, such as cigarettes, e-cigarettes, and chewing tobacco. If you need help quitting, ask your health care provider.  Do not use street drugs.  Do not share needles.  Ask your health care provider for help if you need support or  information about quitting drugs. Alcohol use  Do not drink alcohol   if: ? Your health care provider tells you not to drink. ? You are pregnant, may be pregnant, or are planning to become pregnant.  If you drink alcohol: ? Limit how much you use to 0-1 drink a day. ? Limit intake if you are breastfeeding.  Be aware of how much alcohol is in your drink. In the [place] one drink equals one 12 oz bottle of beer (355 mL), one 5 oz glass of wine (148 mL), or one 1 oz glass of hard liquor (44 mL). General instructions  Schedule regular health, dental, and eye exams.  Stay current with your vaccines.  Tell your health care provider if: ? You often feel depressed. ? You have ever been abused or do not feel safe at home. Summary  Adopting a healthy lifestyle and getting preventive care are important in promoting health and wellness.  Follow your health care provider's instructions about healthy diet, exercising, and getting tested or screened for diseases.  Follow your health care provider's instructions on monitoring your cholesterol and blood pressure. This information is not intended to replace advice given to you by your health care provider. Make sure you discuss any questions you have with your health care provider. Document Revised: 12/11/2017 Document Reviewed: 12/11/2017 Elsevier Patient Education  2021 [employer] Inc.  

## 2020-01-11 NOTE — Progress Notes (Signed)
MEDICARE ANNUAL WELLNESS VISIT  01/11/2020  Telephone Visit Disclaimer This Medicare AWV was conducted by telephone due to national recommendations for restrictions regarding the COVID-19 Pandemic (e.g. social distancing).  I verified, using two identifiers, that I am speaking with Gary Sullivan or their authorized healthcare agent. I discussed the limitations, risks, security, and privacy concerns of performing an evaluation and management service by telephone and the potential availability of an in-person appointment in the future. The patient expressed understanding and agreed to proceed.  Location of Patient: Home Location of Provider (nurse):  In the office  Subjective:    Gary Sullivan is a 45 y.o. adult patient of Gary Nielsen, DO who had a Medicare Annual Wellness Visit today via telephone. Gary Sullivan is Unemployed and lives with their son. she has 1 children. she reports that she is socially active and does interact with friends/family regularly. she is minimally physically active and enjoys playing video games.  Patient Care Team: Gary Nielsen, DO as PCP - General (Osteopathic Medicine)  Advanced Directives 01/11/2020 08/29/2019 08/16/2019 08/16/2019 10/08/2018  Does Patient Have a Medical Advance Directive? No No No No No  Would patient like information on creating a medical advance directive? No - Patient declined No - Patient declined - No - Patient declined No - Patient declined    Hospital Utilization Over the Past 12 Months: # of hospitalizations or ER visits: 0 # of surgeries: 0  Review of Systems    Patient reports that her overall health is unchanged compared to last year.  History obtained from chart review and the patient  Patient Reported Readings (BP, Pulse, CBG, Weight, etc) Weight 279 lbs Height 41f9in  Pain Assessment Pain : 0-10 Pain Score: 3  Pain Type: Chronic pain Pain Location: Leg Pain Orientation: Left,Right Pain Descriptors /  Indicators: Aching Pain Onset: More than a month ago Pain Frequency: Intermittent Pain Relieving Factors: medication  Pain Relieving Factors: medication  Current Medications & Allergies (verified) Allergies as of 01/11/2020      Reactions   Molds & Smuts Cough, Hives, Itching, Shortness Of Breath   Dog Epithelium Allergy Skin Test Itching, Other (See Comments)   Other reaction(s): Other (See Comments)   Sulfamethoxazole-trimethoprim Other (See Comments), Diarrhea, Nausea Only, Rash   Other reaction(s): Abdominal Pain   Grass Pollen([id number] Vern)    Sulfa Antibiotics    Pain, upset stomach, vomiting   Cat Hair Extract Hives, Itching, Rash      Medication List       Accurate as of January 11, 2020  4:43 PM. If you have any questions, ask your nurse or doctor.        amitriptyline 25 MG tablet Commonly known as: ELAVIL Take 25 mg by mouth at bedtime.   atenolol 50 MG tablet Commonly known as: TENORMIN Take 1 tablet (50 mg total) by mouth daily.   azelastine 0.1 % nasal spray Commonly known as: ASTELIN   B-D HYPODERMIC NEEDLE 18GX1.5" 18G X 1-1/2" Misc Generic drug: NEEDLE (DISP) 18 G Use 1 Needle free injection every 7 (seven) days To draw up estrogen   B-D ULTRAFINE III SHORT PEN 31G X 8 MM Misc Generic drug: Insulin Pen Needle USE AS DIRECTED   bicalutamide 50 MG tablet Commonly known as: CASODEX Take 50 mg by mouth daily.   buPROPion HCl ER (XL) 450 MG Tb24 TAKE 1 TABLET(450MG ) BY MOUTH EVERY MORNING   cyclobenzaprine 10 MG tablet Commonly known as: FLEXERIL Take 1 tablet (10 mg total) by  mouth 3 (three) times daily as needed for muscle spasms. MILD/MODERATE SPASM   Depo-Estradiol 5 MG/ML injection Generic drug: estradiol cypionate Inject 0.8 mLs (4 mg total) into the muscle once a week. (SUBQ)   Dexilant 60 MG capsule Generic drug: dexlansoprazole Take 1 capsule (60 mg total) by mouth daily.   dicyclomine 20 MG tablet Commonly known as:  BENTYL Take 2 tablets (40 mg total) by mouth 2 (two) times daily as needed for spasms.   DIGESTIVE ENZYMES PO Take 1 Dose by mouth daily. Digestive enzymes/probiotic   diphenhydrAMINE 25 mg capsule Commonly known as: BENADRYL Take 1 capsule (25 mg total) by mouth every 6 (six) hours as needed (headache).   DULoxetine 60 MG capsule Commonly known as: CYMBALTA Take 60 mg by mouth daily.   fenofibrate 160 MG tablet TAKE 1 TABLET(160 MG) BY MOUTH DAILY   finasteride 1 MG tablet Commonly known as: PROPECIA Take 1 mg by mouth daily.   glycopyrrolate 1 MG tablet Commonly known as: ROBINUL Take 2 tablets (2 mg total) by mouth 2 (two) times daily.   ibuprofen 800 MG tablet Commonly known as: ADVIL Take 800 mg by mouth 3 (three) times daily.   ketorolac 10 MG tablet Commonly known as: TORADOL Take 1 tablet (10 mg total) by mouth every 6 (six) hours as needed (headache).   lisinopril 5 MG tablet Commonly known as: ZESTRIL Take 1 tablet (5 mg total) by mouth daily.   metoCLOPramide 10 MG tablet Commonly known as: REGLAN Take 1 tablet (10 mg total) by mouth every 6 (six) hours as needed (headache).   Omega-3 1000 MG Caps Take by mouth.   primidone 50 MG tablet Commonly known as: Mysoline Take 1 tablet (50 mg total) by mouth 2 (two) times daily.   Semaglutide(0.25 or 0.5MG /DOS) 2 MG/1.5ML Sopn Inject 0.5 mg into the skin once a week.   spironolactone 50 MG tablet Commonly known as: ALDACTONE Take 1 tablet (50 mg total) by mouth 2 (two) times daily.   SUMAtriptan 50 MG tablet Commonly known as: IMITREX Take 1 tablet (50 mg total) by mouth every 2 (two) hours as needed for migraine. May repeat in 2 hours if headache persists or recurs. Max 150 mg per 24 hours.   topiramate 50 MG tablet Commonly known as: Topamax One or two po qHS   Viberzi 75 MG Tabs Generic drug: Eluxadoline TAKE 1 TABLET BY MOUTH IN THE MORNING AND AT BEDTIME   VITAMIN D3 PO Take 50,000 Units by  mouth once a week.       History (reviewed): Past Medical History:  Diagnosis Date  . Asthma   . Elevated heart rate with elevated blood pressure and diagnosis of hypertension   . Environmental allergies   . Fibromyalgia   . Fibromyalgia   . Gender dysphoria   . GERD (gastroesophageal reflux disease)   . High cholesterol   . History of prediabetes 06/04/2019  . Hyperhidrosis   . Hypertension   . IBS (irritable bowel syndrome)   . Transgender 04/21/2019   Past Surgical History:  Procedure Laterality Date  . CARPAL TUNNEL RELEASE Right 10/08/2018   Procedure: RIGHT CARPAL TUNNEL RELEASE;  Surgeon: Tarry Kos, MD;  Location: [facility];  Service: Orthopedics;  Laterality: Right;  . CHOLECYSTECTOMY    . labrum repair    . NASAL SEPTUM SURGERY    . SHOULDER ARTHROSCOPY WITH SUBACROMIAL DECOMPRESSION Left   . TONSILLECTOMY     Family History  Problem  Relation Age of Onset  . High blood pressure Mother   . High blood pressure Father   . High blood pressure Brother   . Pancreatic cancer Maternal Grandfather   . Lung cancer Paternal Grandmother   . Diabetes Paternal Grandfather    Social History   Socioeconomic History  . Marital status: Divorced    Spouse name: Not on file  . Number of children: 1  . Years of education: Not on file  . Highest education level: Bachelor's degree (e.g., BA, AB, BS)  Occupational History  . Occupation: Unemployed  Tobacco Use  . Smoking status: Former Smoker    Types: Cigarettes  . Smokeless tobacco: Never Used  Vaping Use  . Vaping Use: Never used  Substance and Sexual Activity  . Alcohol use: Not Currently  . Drug use: Not Currently  . Sexual activity: Not Currently    Partners: Female  Other Topics Concern  . Not on file  Social History Narrative   Right handed    Caffeine use: daily   Lives with brother, nephew and son   Social Determinants of Health   Financial Resource Strain: Low Risk   . Difficulty of  Paying Living Expenses: Not hard at all  Food Insecurity: No Food Insecurity  . Worried About Programme researcher, broadcasting/film/videounning Out of Food in the Last Year: Never true  . Ran Out of Food in the Last Year: Never true  Transportation Needs: No Transportation Needs  . Lack of Transportation (Medical): No  . Lack of Transportation (Non-Medical): No  Physical Activity: Inactive  . Days of Exercise per Week: 0 days  . Minutes of Exercise per Session: 0 min  Stress: No Stress Concern Present  . Feeling of Stress : Only a little  Social Connections: Socially Isolated  . Frequency of Communication with Friends and Family: More than three times a week  . Frequency of Social Gatherings with Friends and Family: More than three times a week  . Attends Religious Services: Never  . Active Member of Clubs or Organizations: No  . Attends BankerClub or Organization Meetings: Never  . Marital Status: Divorced    Activities of Daily Living In your present state of health, do you have any difficulty performing the following activities: 01/11/2020  Hearing? N  Vision? N  Difficulty concentrating or making decisions? Y  Comment 2-3 times a day.  Walking or climbing stairs? N  Dressing or bathing? N  Doing errands, shopping? N  Preparing Food and eating ? N  Using the Toilet? N  In the past six months, have you accidently leaked urine? N  Do you have problems with loss of bowel control? N  Managing your Medications? N  Managing your Finances? N  Housekeeping or managing your Housekeeping? Y  Comment due to fibromylagia  Some recent data might be hidden    Patient Education/ Literacy How often do you need to have someone help you when you read instructions, pamphlets, or other written materials from your doctor or pharmacy?: 1 - Never What is the last grade level you completed in school?: College Degree  Exercise Current Exercise Habits: The patient does not participate in regular exercise at present, Exercise limited by: None  identified  Diet Patient reports consuming 1 meals a day and 1 snack(s) a day Patient reports that her primary diet is: Regular Patient reports that she does have regular access to food.   Depression Screen PHQ 2/9 Scores 01/11/2020 04/21/2019  PHQ - 2 Score 3  4  PHQ- 9 Score 16 18     Fall Risk Fall Risk  01/11/2020  Falls in the past year? 1  Number falls in past yr: 1  Injury with Fall? 0  Risk for fall due to : History of fall(s)  Follow up Falls evaluation completed;Education provided;Falls prevention discussed     Objective:  Jak Haggar seemed alert and oriented and she participated appropriately during our telephone visit.  Blood Pressure Weight BMI  BP Readings from Last 3 Encounters:  11/09/19 100/64  09/03/19 105/75  09/01/19 112/71   Wt Readings from Last 3 Encounters:  01/11/20 279 lb (126.6 kg)  11/09/19 279 lb 0.6 oz (126.6 kg)  09/03/19 293 lb (132.9 kg)   BMI Readings from Last 1 Encounters:  01/11/20 41.20 kg/m    *Unable to obtain current vital signs, weight, and BMI due to telephone visit type  Hearing/Vision  . [place] did not seem to have difficulty with hearing/understanding during the telephone conversation . Reports that she has not had a formal eye exam by an eye care professional within the past year . Reports that she has not had a formal hearing evaluation within the past year *Unable to fully assess hearing and vision during telephone visit type  Cognitive Function: 6CIT Screen 01/11/2020  What Year? 0 points  What month? 0 points  What time? 0 points  Count back from 20 0 points  Months in reverse 0 points  Repeat phrase 0 points  Total Score 0   (Normal:0-7, Significant for Dysfunction: >8)  Normal Cognitive Function Screening: Yes   Immunization & Health Maintenance Record Immunization History  Administered Date(s) Administered  . Moderna Sars-Covid-2 Vaccination 08/11/2019, 09/05/2019    Health Maintenance  Topic  Date Due  . INFLUENZA VACCINE  03/31/2020 (Originally 08/02/2019)  . TETANUS/TDAP  04/20/2020 (Originally 02/12/1996)  . Hepatitis C Screening  01/10/2021 (Originally January 29, 1975)  . HIV Screening  01/10/2021 (Originally 02/12/1992)  . COVID-19 Vaccine  Completed  . PAP SMEAR-Modifier  Discontinued       Assessment  This is a routine wellness examination for Gary Sullivan.  Health Maintenance: Due or Overdue There are no preventive care reminders to display for this patient.  Vasiliy Mccarry does not need a referral for Community Assistance: Care Management:   no Social Work:    no Prescription Assistance:  no Nutrition/Diabetes Education:  no   Plan:  Personalized Goals Goals Addressed            This Visit's Progress   . Patient Stated       01/11/2020 AWV Goal: Exercise for General Health   Patient will verbalize understanding of the benefits of increased physical activity:  Exercising regularly is important. It will improve your overall fitness, flexibility, and endurance.  Regular exercise also will improve your overall health. It can help you control your weight, reduce stress, and improve your bone density.  Over the next year, patient will increase physical activity as tolerated with a goal of at least 150 minutes of moderate physical activity per week.   You can tell that you are exercising at a moderate intensity if your heart starts beating faster and you start breathing faster but can still hold a conversation.  Moderate-intensity exercise ideas include:  Walking 1 mile (1.6 km) in about 15 minutes  Biking  Hiking  Golfing  Dancing  Water aerobics  Patient will verbalize understanding of everyday activities that increase physical activity by providing examples like the following: ?  Yard work, such as: ? Pushing a Surveyor, mininglawn mower ? Raking and bagging leaves ? Washing your car ? Pushing a stroller ? Shoveling snow ? Gardening ? Washing windows or  floors  Patient will be able to explain general safety guidelines for exercising:   Before you start a new exercise program, talk with your health care provider.  Do not exercise so much that you hurt yourself, feel dizzy, or get very short of breath.  Wear comfortable clothes and wear shoes with good support.  Drink plenty of water while you exercise to prevent dehydration or heat stroke.  Work out until your breathing and your heartbeat get faster.       Personalized Health Maintenance & Screening Recommendations  Influenza vaccine Td vaccine  Lung Cancer Screening Recommended: no (Low Dose CT Chest recommended if Age 59-80 years, 30 pack-year currently smoking OR have quit w/in past 15 years) Hepatitis C Screening recommended: yes HIV Screening recommended: yes  Advanced Directives: Written information was not prepared per patient's request.  Referrals & Orders No orders of the defined types were placed in this encounter.   Follow-up Plan . Follow-up with Gary NielsenAlexander, Natalie, DO as planned . Schedule your appointment for your Tdap and flu vaccine.    I have personally reviewed and noted the following in the patient's chart:   . Medical and social history . Use of alcohol, tobacco or illicit drugs  . Current medications and supplements . Functional ability and status . Nutritional status . Physical activity . Advanced directives . List of other physicians . Hospitalizations, surgeries, and ER visits in previous 12 months . Vitals . Screenings to include cognitive, depression, and falls . Referrals and appointments  In addition, I have reviewed and discussed with Gary JewettNathan Meckley certain preventive protocols, quality metrics, and best practice recommendations. A written personalized care plan for preventive services as well as general preventive health recommendations is available and can be mailed to the patient at her request.      Ciro BackerBableen   Malijah Lietz,RN  01/11/2020

## 2020-01-13 DIAGNOSIS — R69 Illness, unspecified: Secondary | ICD-10-CM | POA: Diagnosis not present

## 2020-01-20 DIAGNOSIS — R69 Illness, unspecified: Secondary | ICD-10-CM | POA: Diagnosis not present

## 2020-01-21 ENCOUNTER — Ambulatory Visit: Payer: Medicare HMO | Admitting: Gastroenterology

## 2020-01-21 ENCOUNTER — Telehealth (INDEPENDENT_AMBULATORY_CARE_PROVIDER_SITE_OTHER): Payer: Medicare HMO | Admitting: Gastroenterology

## 2020-01-21 ENCOUNTER — Other Ambulatory Visit: Payer: Self-pay | Admitting: Neurology

## 2020-01-21 ENCOUNTER — Encounter: Payer: Self-pay | Admitting: Gastroenterology

## 2020-01-21 DIAGNOSIS — K580 Irritable bowel syndrome with diarrhea: Secondary | ICD-10-CM | POA: Diagnosis not present

## 2020-01-21 DIAGNOSIS — Z9049 Acquired absence of other specified parts of digestive tract: Secondary | ICD-10-CM

## 2020-01-21 DIAGNOSIS — K219 Gastro-esophageal reflux disease without esophagitis: Secondary | ICD-10-CM

## 2020-01-21 DIAGNOSIS — R1010 Upper abdominal pain, unspecified: Secondary | ICD-10-CM | POA: Diagnosis not present

## 2020-01-21 MED ORDER — CHOLESTYRAMINE 4 G PO PACK: 4.0000 g | PACK | Freq: Every day | ORAL | 5 refills | Status: DC

## 2020-01-21 MED ORDER — HYOSCYAMINE SULFATE SL 0.125 MG SL SUBL: 0.1250 mg | SUBLINGUAL_TABLET | Freq: Four times a day (QID) | SUBLINGUAL | 5 refills | Status: DC | PRN

## 2020-01-21 NOTE — Progress Notes (Signed)
Chief Complaint: Upper abdominal pain   Referring Provider:     [name], DO    HPI:    Due to current restrictions/limitations of in-office visits due to the COVID-19 pandemic, this scheduled clinical appointment was converted to a telehealth virtual consultation using MyChart video  -Time of medical discussion: 21 minutes -The patient did consent to this virtual visit and is aware of possible charges through their insurance for this visit.  -Names of all parties present: Gary Sullivan (patient), Doristine Locks, DO, [facility] (physician) -Patient location: Home -Physician location: Office  Gary Sullivan is a 45 y.o. adult with a history of fibromyalgia, transgender/gender dysphoria, HTN, hyperlipidemia, GERD, IBS, diabetes, migraines, referred to the Gastroenterology Clinic for evaluation of upper abdominal pain.  Reports a long-standing hx of "stomach problems". Has had several EGDs and colonoscopies in the past in [place], [place]. No reports available for review, but reports mild gastritis as only thing recalled and was diagnosed with IBS-D and GERD.  Reflux symptoms otherwise well controlled with Dexilant 60 mg/day.  IBS-D has been treated with Bentyl for a long time and was started on Viberzi about 2 months ago. Bentyl less efficacious lately.  Otherwise consumes high-fiber diet.  Hx of migranes, with breakthrough symptoms increasing lately and has been taking NSAIDs with subsequent increasing MEG/upper abdominal pain.  Abdominal pain has a burning quality. No radiation. Can awake from sleep. Pain independent of migraines and different in quality than IBS-D symptoms. Stopped NSAIDs recently due to abdominal sxs.   History of cholecystectomy years ago.  Recent evaluation otherwise unrevealing to include normal CMP, amylase, lipase, GGT, CBC.  RUQ [place] with hepatic steatosis and s/p ccy, otherwise unremarkable.  Past medical history, past surgical history, social  history, family history, medications, and allergies reviewed in the chart and with patient.    Past Medical History:  Diagnosis Date  . Asthma   . Elevated heart rate with elevated blood pressure and diagnosis of hypertension   . Environmental allergies   . Fibromyalgia   . Fibromyalgia   . Gender dysphoria   . GERD (gastroesophageal reflux disease)   . High cholesterol   . History of prediabetes 06/04/2019  . Hyperhidrosis   . Hypertension   . IBS (irritable bowel syndrome)   . Transgender 04/21/2019     Past Surgical History:  Procedure Laterality Date  . CARPAL TUNNEL RELEASE Right 10/08/2018   Procedure: RIGHT CARPAL TUNNEL RELEASE;  Surgeon: Tarry Kos, MD;  Location: [facility];  Service: Orthopedics;  Laterality: Right;  . CHOLECYSTECTOMY    . COLONOSCOPY     Around 2014. [facility] [place]  . ESOPHAGOGASTRODUODENOSCOPY     Aound 2014 [facility] [place], [place]  . labrum repair    . NASAL SEPTUM SURGERY    . SHOULDER ARTHROSCOPY WITH SUBACROMIAL DECOMPRESSION Left   . TONSILLECTOMY     Family History  Problem Relation Age of Onset  . High blood pressure Mother   . High blood pressure Father   . Irritable bowel syndrome Father   . High blood pressure Brother   . Pancreatic cancer Maternal Grandfather   . Colon cancer Maternal Grandfather   . Lung cancer Paternal Grandmother   . Diabetes Paternal Grandfather   . Colon cancer Maternal Aunt   . Irritable bowel syndrome Sister   . Irritable bowel syndrome Sister   . Esophageal cancer Neg Hx  Social History   Tobacco Use  . Smoking status: Former Smoker    Types: Cigarettes  . Smokeless tobacco: Never Used  Vaping Use  . Vaping Use: Every day  Substance Use Topics  . Alcohol use: Not Currently  . Drug use: Not Currently   Current Outpatient Medications  Medication Sig Dispense Refill  . amitriptyline (ELAVIL) 25 MG tablet Take 25 mg by mouth at bedtime.    Marland Kitchen atenolol  (TENORMIN) 50 MG tablet Take 1 tablet (50 mg total) by mouth daily. 90 tablet 3  . azelastine (ASTELIN) 0.1 % nasal spray     . bicalutamide (CASODEX) 50 MG tablet Take 50 mg by mouth daily.  (Patient not taking: No sig reported)    . buPROPion HCl ER, XL, 450 MG TB24 TAKE 1 TABLET(450MG ) BY MOUTH EVERY MORNING 90 tablet 1  . Cholecalciferol (VITAMIN D3 PO) Take 50,000 Units by mouth once a week.    . cyclobenzaprine (FLEXERIL) 10 MG tablet Take 1 tablet (10 mg total) by mouth 3 (three) times daily as needed for muscle spasms. MILD/MODERATE SPASM 90 tablet 3  . DEPO-ESTRADIOL 5 MG/ML injection Inject 0.8 mLs (4 mg total) into the muscle once a week. (SUBQ) 5 mL 1  . dexlansoprazole (DEXILANT) 60 MG capsule Take 1 capsule (60 mg total) by mouth daily. 90 capsule 3  . dicyclomine (BENTYL) 20 MG tablet Take 2 tablets (40 mg total) by mouth 2 (two) times daily as needed for spasms. 360 tablet 3  . DIGESTIVE ENZYMES PO Take 1 Dose by mouth daily. Digestive enzymes/probiotic (Patient not taking: No sig reported)    . diphenhydrAMINE (BENADRYL) 25 mg capsule Take 1 capsule (25 mg total) by mouth every 6 (six) hours as needed (headache). 30 capsule 1  . DULoxetine (CYMBALTA) 60 MG capsule Take 60 mg by mouth daily.    . Eluxadoline (VIBERZI) 75 MG TABS TAKE 1 TABLET BY MOUTH IN THE MORNING AND AT BEDTIME 60 tablet 1  . fenofibrate 160 MG tablet TAKE 1 TABLET(160 MG) BY MOUTH DAILY 90 tablet 3  . finasteride (PROPECIA) 1 MG tablet Take 1 mg by mouth daily.    Marland Kitchen glycopyrrolate (ROBINUL) 1 MG tablet Take 2 tablets (2 mg total) by mouth 2 (two) times daily. 180 tablet 3  . ibuprofen (ADVIL) 800 MG tablet Take 800 mg by mouth 3 (three) times daily. (Patient not taking: No sig reported)    . Insulin Pen Needle (B-D ULTRAFINE III SHORT PEN) 31G X 8 MM MISC USE AS DIRECTED (Patient not taking: No sig reported)    . ketorolac (TORADOL) 10 MG tablet Take 1 tablet (10 mg total) by mouth every 6 (six) hours as needed  (headache). (Patient not taking: No sig reported) 30 tablet 0  . lisinopril (ZESTRIL) 5 MG tablet Take 1 tablet (5 mg total) by mouth daily. 90 tablet 3  . metoCLOPramide (REGLAN) 10 MG tablet Take 1 tablet (10 mg total) by mouth every 6 (six) hours as needed (headache). 30 tablet 1  . NEEDLE, DISP, 18 G (B-D HYPODERMIC NEEDLE 18GX1.5") 18G X 1-1/2" MISC Use 1 Needle free injection every 7 (seven) days To draw up estrogen    . Omega-3 1000 MG CAPS Take by mouth.    . primidone (MYSOLINE) 50 MG tablet Take 1 tablet (50 mg total) by mouth 2 (two) times daily. 180 tablet 3  . Semaglutide,0.25 or 0.5MG /DOS, 2 MG/1.5ML SOPN Inject 0.5 mg into the skin once a week. 6 pen  5  . spironolactone (ALDACTONE) 50 MG tablet Take 1 tablet (50 mg total) by mouth 2 (two) times daily. 180 tablet 3  . SUMAtriptan (IMITREX) 50 MG tablet Take 1 tablet (50 mg total) by mouth every 2 (two) hours as needed for migraine. May repeat in 2 hours if headache persists or recurs. Max 150 mg per 24 hours. 10 tablet 0  . topiramate (TOPAMAX) 50 MG tablet One or two po qHS 60 tablet 5   No current facility-administered medications for this visit.   Allergies  Allergen Reactions  . Molds & Smuts Cough, Hives, Itching and Shortness Of Breath  . Dog Epithelium Allergy Skin Test Itching and Other (See Comments)    Other reaction(s): Other (See Comments)   . Sulfamethoxazole-Trimethoprim Other (See Comments), Diarrhea, Nausea Only and Rash    Other reaction(s): Abdominal Pain   . Grass Pollen(K-O-R-T-Swt Vern)   . Sulfa Antibiotics     Pain, upset stomach, vomiting  . Cat Hair Extract Hives, Itching and Rash     Review of Systems: All systems reviewed and negative except where noted in HPI.     Physical Exam:    Complete physical exam not completed due to the nature of this telehealth communication.   Gen: Awake, alert, and oriented, and well communicative. HEENT: EOMI, non-icteric sclera, NCAT, MMM Neck: Normal  movement of head and neck Pulm: No labored breathing, speaking in full sentences without conversational dyspnea Derm: No apparent lesions or bruising in visible field MS: Moves all visible extremities without noticeable abnormality Psych: Pleasant, cooperative, normal speech, thought processing seemingly intact   ASSESSMENT AND PLAN;   1) Upper Abdominal pain - EGD to evaluate for mucosal/luminal pathology, to include gastritis, PUD particularly in the setting of recent frequent NSAID use for migraines - Has since stopped NSAIDs - Currently taking PPI (Dexilant)  2) GERD: - Well-controlled on current therapy - Continue Dexilant - Continue antireflux lifestyle/dietary modifications - Evaluate for hiatal hernia, erosive esophagitis, LES laxity, etc. at time EGD above  3) IBS-D 4) History of cholecystectomy  - Change Bentyl to Levsin - Stop Viberzi d/t prior ccy - Start Questran 4 g/day and can uptitrate as needed.  Needs to take 2 hours away from other medications - Continue high fiber diet. Will send fiber chart with goal 25+ gm fiber/diet.  Discussed adding supplemental fiber such as Citrucel or Benefiber - Restarting probiotic  5) History of migraines - Abdominal pain occurs independent of migraines.  Briefly discussed overlap of intestinal migraines if above work-up otherwise unrevealing  The indications, risks, and benefits of EGD were explained to the patient in detail. Risks include but are not limited to bleeding, perforation, adverse reaction to medications, and cardiopulmonary compromise. Sequelae include but are not limited to the possibility of surgery, hospitalization, and mortality. The patient verbalized understanding and wished to proceed. All questions answered, referred to scheduler. Further recommendations pending results of the exam.    Shellia Cleverly, DO, FACG  01/21/2020, 10:43 AM   [name], DO

## 2020-01-21 NOTE — Patient Instructions (Signed)
If you are age 43 or younger, your body mass index should be between 19-25. Your There is no height or weight on file to calculate BMI. If this is out of the aformentioned range listed, please consider follow up with your Primary Care Provider.   You have been scheduled for an endoscopy. Please follow written instructions given to you at your visit today. If you use inhalers (even only as needed), please bring them with you on the day of your procedure.  Due to recent changes in healthcare laws, you may see the results of your imaging and laboratory studies on MyChart before your provider has had a chance to review them.  We understand that in some cases there may be results that are confusing or concerning to you. Not all laboratory results come back in the same time frame and the provider may be waiting for multiple results in order to interpret others.  Please give [place] 48 hours in order for your provider to thoroughly review all the results before contacting the office for clarification of your results.   We have sent the following medications to your pharmacy for you to pick up at your convenience:  START: Levsin 0.125mg  one tablet sublingual (under the tongue) every 6 hours as needed for abdominal pain.  START: Questran 4gm packet daily  STOP:Bentyl, Viberzi  Thank you for entrusting me with your care and choosing [facility].  Dr Barron Alvine

## 2020-01-26 ENCOUNTER — Telehealth: Payer: Self-pay | Admitting: Gastroenterology

## 2020-01-27 DIAGNOSIS — R69 Illness, unspecified: Secondary | ICD-10-CM | POA: Diagnosis not present

## 2020-01-27 MED ORDER — HYOSCYAMINE SULFATE SL 0.125 MG SL SUBL: SUBLINGUAL_TABLET | SUBLINGUAL | 3 refills | Status: DC

## 2020-01-27 NOTE — Telephone Encounter (Signed)
I have sent prescription to patient's pharmacy.  

## 2020-01-27 NOTE — Telephone Encounter (Signed)
Can try seeing if Symax or Levbid are better covered.  Same dosing, frequency, and quantity.  Thank you.

## 2020-01-27 NOTE — Telephone Encounter (Signed)
Please advise if you would like to change this medication?

## 2020-02-04 ENCOUNTER — Telehealth: Payer: Self-pay | Admitting: Gastroenterology

## 2020-02-08 ENCOUNTER — Telehealth: Payer: Self-pay | Admitting: Gastroenterology

## 2020-02-08 DIAGNOSIS — R69 Illness, unspecified: Secondary | ICD-10-CM | POA: Diagnosis not present

## 2020-02-08 NOTE — Telephone Encounter (Signed)
Pt was unable to keep the EGD they were scheduled for today at 11am due to not having transportation, pt has rescheduled to 2/15.

## 2020-02-09 ENCOUNTER — Encounter: Payer: Medicare HMO | Admitting: Gastroenterology

## 2020-02-09 ENCOUNTER — Ambulatory Visit (INDEPENDENT_AMBULATORY_CARE_PROVIDER_SITE_OTHER): Payer: Medicare HMO | Admitting: Physician Assistant

## 2020-02-09 ENCOUNTER — Other Ambulatory Visit: Payer: Self-pay

## 2020-02-09 ENCOUNTER — Encounter: Payer: Self-pay | Admitting: Physician Assistant

## 2020-02-09 VITALS — BP 129/81 | HR 84 | Temp 98.2°F | Wt 288.0 lb

## 2020-02-09 DIAGNOSIS — K0889 Other specified disorders of teeth and supporting structures: Secondary | ICD-10-CM | POA: Diagnosis not present

## 2020-02-09 DIAGNOSIS — K047 Periapical abscess without sinus: Secondary | ICD-10-CM | POA: Diagnosis not present

## 2020-02-09 DIAGNOSIS — K089 Disorder of teeth and supporting structures, unspecified: Secondary | ICD-10-CM

## 2020-02-09 DIAGNOSIS — K0381 Cracked tooth: Secondary | ICD-10-CM | POA: Diagnosis not present

## 2020-02-09 MED ORDER — IBUPROFEN 800 MG PO TABS: 800.0000 mg | ORAL_TABLET | Freq: Three times a day (TID) | ORAL | 0 refills | Status: DC

## 2020-02-09 MED ORDER — HYDROCODONE-ACETAMINOPHEN 5-325 MG PO TABS: 1.0000 | ORAL_TABLET | Freq: Four times a day (QID) | ORAL | 0 refills | Status: DC | PRN

## 2020-02-09 MED ORDER — HYDROCODONE-ACETAMINOPHEN 5-325 MG PO TABS: 1.0000 | ORAL_TABLET | Freq: Four times a day (QID) | ORAL | 0 refills | Status: AC | PRN

## 2020-02-09 MED ORDER — AMOXICILLIN-POT CLAVULANATE 875-125 MG PO TABS: 1.0000 | ORAL_TABLET | Freq: Two times a day (BID) | ORAL | 0 refills | Status: DC

## 2020-02-09 NOTE — Progress Notes (Signed)
Subjective:    Patient ID: Gary Sullivan, adult    DOB: 11/22/1975, 45 y.o.   MRN: [id number]  HPI  Patient is a 45 year old obese male to male who presents to the clinic with 1 week of left upper tooth pain.  Pain has progressively gotten worse.  Patient describes pain as throbbing and pulsating.  He would rated 7 out of 10.  Eating, drinking, breathing makes it worse.  He is having some insurance problems and his dentist will not see him without a huge upfront cost.  He wanted to come here see if he can get anything until he can get his dental insurance straightened out.  He denies any fever, chills, nausea.  He is using over-the-counter ibuprofen approximately 200 mg that is not helping at all. No known trauma or injury.    .. Active Ambulatory Problems    Diagnosis Date Noted  . Numbness 05/28/2018  . Tremor 05/28/2018  . Fibromyalgia 05/28/2018  . Depression with anxiety 05/28/2018  . OSA (obstructive sleep apnea) 06/19/2018  . Excessive daytime sleepiness 06/19/2018  . Asthma 01/01/1982  . Gender dysphoria 01/01/1985  . GERD (gastroesophageal reflux disease) 01/01/2005  . High blood pressure 01/02/2008  . High cholesterol 01/02/2012  . IBS (irritable bowel syndrome) 01/01/2006  . Social anxiety disorder 01/02/1988  . Carpal tunnel syndrome on right 10/07/2018  . Transgender 04/21/2019  . History of prediabetes 06/04/2019  . Broken or cracked tooth, nontraumatic 02/09/2020  . Poor dentition 02/09/2020  . Tooth pain 02/09/2020   Resolved Ambulatory Problems    Diagnosis Date Noted  . Class 3 severe obesity in adult [facility] 06/04/2018   Past Medical History:  Diagnosis Date  . Elevated heart rate with elevated blood pressure and diagnosis of hypertension   . Environmental allergies   . Hyperhidrosis   . Hypertension       Review of Systems    see HPI.  Objective:   Physical Exam Vitals reviewed.  Constitutional:      Appearance: Normal appearance.  HENT:      Head: Normocephalic.     Nose: Nose normal.     Mouth/Throat:     Comments: Left upper molar cracked tooth with erythematous gums and swelling.   Left lower molars multiple cracked teeth.  Eyes:     Pupils: Pupils are equal, round, and reactive to light.  Cardiovascular:     Rate and Rhythm: Normal rate and regular rhythm.  Neurological:     Mental Status: She is alert.  Psychiatric:        Mood and Affect: Mood normal.           Assessment & Plan:  Marland KitchenMarland KitchenCasten was seen today for dental pain.  Diagnoses and all orders for this visit:  Tooth pain -     ibuprofen (ADVIL) 800 MG tablet; Take 1 tablet (800 mg total) by mouth 3 (three) times daily. -     amoxicillin-clavulanate (AUGMENTIN) 875-125 MG tablet; Take 1 tablet by mouth 2 (two) times daily. -     HYDROcodone-acetaminophen (NORCO/VICODIN) 5-325 MG tablet; Take 1 tablet by mouth every 6 (six) hours as needed for up to 5 days for moderate pain.  Poor dentition -     ibuprofen (ADVIL) 800 MG tablet; Take 1 tablet (800 mg total) by mouth 3 (three) times daily. -     amoxicillin-clavulanate (AUGMENTIN) 875-125 MG tablet; Take 1 tablet by mouth 2 (two) times daily. -     HYDROcodone-acetaminophen (NORCO/VICODIN) 5-325  MG tablet; Take 1 tablet by mouth every 6 (six) hours as needed for up to 5 days for moderate pain.  Broken or cracked tooth, nontraumatic -     ibuprofen (ADVIL) 800 MG tablet; Take 1 tablet (800 mg total) by mouth 3 (three) times daily. -     amoxicillin-clavulanate (AUGMENTIN) 875-125 MG tablet; Take 1 tablet by mouth 2 (two) times daily. -     HYDROcodone-acetaminophen (NORCO/VICODIN) 5-325 MG tablet; Take 1 tablet by mouth every 6 (six) hours as needed for up to 5 days for moderate pain.  Dental infection -     ibuprofen (ADVIL) 800 MG tablet; Take 1 tablet (800 mg total) by mouth 3 (three) times daily. -     amoxicillin-clavulanate (AUGMENTIN) 875-125 MG tablet; Take 1 tablet by mouth 2 (two) times  daily. -     HYDROcodone-acetaminophen (NORCO/VICODIN) 5-325 MG tablet; Take 1 tablet by mouth every 6 (six) hours as needed for up to 5 days for moderate pain.  Other orders -     Discontinue: HYDROcodone-acetaminophen (NORCO/VICODIN) 5-325 MG tablet; Take 1 tablet by mouth every 6 (six) hours as needed for up to 5 days for moderate pain.  Pt needs to see dentist ASAP.  Poor dentition. Multiple cracked teeth exposed to the nerve.  Appearance of infection along the gumline.  Start augmentin.  Ibuprofen up to 3 times a day.  Marland Kitchen.PDMP reviewed during this encounter.  No concerns with abuse Given 5 day small quantity of pain medication to use for break through pain with ibuprofen.  Continue salt water gargles.

## 2020-02-11 DIAGNOSIS — E1151 Type 2 diabetes mellitus with diabetic peripheral angiopathy without gangrene: Secondary | ICD-10-CM | POA: Diagnosis not present

## 2020-02-11 DIAGNOSIS — E785 Hyperlipidemia, unspecified: Secondary | ICD-10-CM | POA: Diagnosis not present

## 2020-02-11 DIAGNOSIS — G43909 Migraine, unspecified, not intractable, without status migrainosus: Secondary | ICD-10-CM | POA: Diagnosis not present

## 2020-02-11 DIAGNOSIS — G4733 Obstructive sleep apnea (adult) (pediatric): Secondary | ICD-10-CM | POA: Diagnosis not present

## 2020-02-11 DIAGNOSIS — E1142 Type 2 diabetes mellitus with diabetic polyneuropathy: Secondary | ICD-10-CM | POA: Diagnosis not present

## 2020-02-11 DIAGNOSIS — R69 Illness, unspecified: Secondary | ICD-10-CM | POA: Diagnosis not present

## 2020-02-11 DIAGNOSIS — I471 Supraventricular tachycardia: Secondary | ICD-10-CM | POA: Diagnosis not present

## 2020-02-12 ENCOUNTER — Telehealth: Payer: Self-pay

## 2020-02-12 NOTE — Telephone Encounter (Signed)
Prior authorization for bupropion 450 mg submitted to patient's insurance. Pending determination.

## 2020-02-16 ENCOUNTER — Encounter: Payer: Medicare HMO | Admitting: Gastroenterology

## 2020-02-16 DIAGNOSIS — R69 Illness, unspecified: Secondary | ICD-10-CM | POA: Diagnosis not present

## 2020-02-22 DIAGNOSIS — R69 Illness, unspecified: Secondary | ICD-10-CM | POA: Diagnosis not present

## 2020-03-01 DIAGNOSIS — R69 Illness, unspecified: Secondary | ICD-10-CM | POA: Diagnosis not present

## 2020-03-07 ENCOUNTER — Other Ambulatory Visit: Payer: Self-pay | Admitting: Physician Assistant

## 2020-03-07 DIAGNOSIS — K0889 Other specified disorders of teeth and supporting structures: Secondary | ICD-10-CM

## 2020-03-07 DIAGNOSIS — K089 Disorder of teeth and supporting structures, unspecified: Secondary | ICD-10-CM

## 2020-03-07 DIAGNOSIS — K0381 Cracked tooth: Secondary | ICD-10-CM

## 2020-03-07 DIAGNOSIS — K047 Periapical abscess without sinus: Secondary | ICD-10-CM

## 2020-03-09 ENCOUNTER — Other Ambulatory Visit: Payer: Self-pay

## 2020-03-09 ENCOUNTER — Encounter: Payer: Self-pay | Admitting: Gastroenterology

## 2020-03-09 ENCOUNTER — Ambulatory Visit (AMBULATORY_SURGERY_CENTER): Payer: Medicare HMO | Admitting: Gastroenterology

## 2020-03-09 VITALS — BP 102/69 | HR 78 | Temp 97.2°F | Resp 17 | Ht 70.0 in | Wt 280.0 lb

## 2020-03-09 DIAGNOSIS — R1010 Upper abdominal pain, unspecified: Secondary | ICD-10-CM | POA: Diagnosis not present

## 2020-03-09 DIAGNOSIS — K2970 Gastritis, unspecified, without bleeding: Secondary | ICD-10-CM

## 2020-03-09 DIAGNOSIS — J45909 Unspecified asthma, uncomplicated: Secondary | ICD-10-CM | POA: Diagnosis not present

## 2020-03-09 DIAGNOSIS — K219 Gastro-esophageal reflux disease without esophagitis: Secondary | ICD-10-CM

## 2020-03-09 DIAGNOSIS — K2950 Unspecified chronic gastritis without bleeding: Secondary | ICD-10-CM | POA: Diagnosis not present

## 2020-03-09 DIAGNOSIS — I10 Essential (primary) hypertension: Secondary | ICD-10-CM | POA: Diagnosis not present

## 2020-03-09 MED ORDER — SODIUM CHLORIDE 0.9 % IV SOLN: 500.0000 mL | Freq: Once | INTRAVENOUS | Status: DC

## 2020-03-09 NOTE — Progress Notes (Signed)
Notified Dr. Barron Alvine of patients blood sugar of 75. Patient is asymtomatic. No orders given.

## 2020-03-09 NOTE — Progress Notes (Signed)
VS by CW. ?

## 2020-03-09 NOTE — Patient Instructions (Signed)
YOU HAD AN ENDOSCOPIC PROCEDURE TODAY AT THE [facility]:   Refer to the procedure report that was given to you for any specific questions about what was found during the examination.  If the procedure report does not answer your questions, please call your gastroenterologist to clarify.  If you requested that your care partner not be given the details of your procedure findings, then the procedure report has been included in a sealed envelope for you to review at your convenience later.  YOU SHOULD EXPECT: Some feelings of bloating in the abdomen. Passage of more gas than usual.  Walking can help get rid of the air that was put into your GI tract during the procedure and reduce the bloating. If you had a lower endoscopy (such as a colonoscopy or flexible sigmoidoscopy) you may notice spotting of blood in your stool or on the toilet paper. If you underwent a bowel prep for your procedure, you may not have a normal bowel movement for a few days.  Please Note:  You might notice some irritation and congestion in your nose or some drainage.  This is from the oxygen used during your procedure.  There is no need for concern and it should clear up in a day or so.  SYMPTOMS TO REPORT IMMEDIATELY:   Following lower endoscopy (colonoscopy or flexible sigmoidoscopy):  Excessive amounts of blood in the stool  Significant tenderness or worsening of abdominal pains  Swelling of the abdomen that is new, acute  Fever of 100F or higher   Following upper endoscopy (EGD)  Vomiting of blood or coffee ground material  New chest pain or pain under the shoulder blades  Painful or persistently difficult swallowing  New shortness of breath  Fever of 100F or higher  Black, tarry-looking stools  For urgent or emergent issues, a gastroenterologist can be reached at any hour by calling [phone]. Do not use MyChart messaging for urgent concerns.    DIET:  We do recommend a small meal at first, but  then you may proceed to your regular diet.  Drink plenty of fluids but you should avoid alcoholic beverages for 24 hours.  ACTIVITY:  You should plan to take it easy for the rest of today and you should NOT DRIVE or use heavy machinery until tomorrow (because of the sedation medicines used during the test).    FOLLOW UP: Our staff will call the number listed on your records 48-72 hours following your procedure to check on you and address any questions or concerns that you may have regarding the information given to you following your procedure. If we do not reach you, we will leave a message.  We will attempt to reach you two times.  During this call, we will ask if you have developed any symptoms of COVID 19. If you develop any symptoms (ie: fever, flu-like symptoms, shortness of breath, cough etc.) before then, please call [phone].  If you test positive for Covid 19 in the 2 weeks post procedure, please call and report this information to us.    If any biopsies were taken you will be contacted by phone or by letter within the next 1-3 weeks.  Please call us at [phone] if you have not heard about the biopsies in 3 weeks.    SIGNATURES/CONFIDENTIALITY: You and/or your care partner have signed paperwork which will be entered into your electronic medical record.  These signatures attest to the fact that that the information above on   your After Visit Summary has been reviewed and is understood.  Full responsibility of the confidentiality of this discharge information lies with you and/or your care-partner. 

## 2020-03-09 NOTE — Progress Notes (Unsigned)
1646 Robinul 0.1 mg IV given due large amount of secretions upon assessment.  MD made aware, vss

## 2020-03-09 NOTE — Op Note (Signed)
[facility] Patient Name: Gary Sullivan Procedure Date: 03/09/2020 4:42 PM MRN: [id number] Endoscopist: Doristine Locks , MD Age: 45 Referring MD:  Date of Birth: 1975-03-07 Gender: Male Account #: [id number] Procedure:                Upper GI endoscopy Indications:              Epigastric abdominal pain, Upper abdominal pain,                            Suspected esophageal reflux, currently taking                            Dexilant. Medicines:                Monitored Anesthesia Care Procedure:                Pre-Anesthesia Assessment:                           - Prior to the procedure, a History and Physical                            was performed, and patient medications and                            allergies were reviewed. The patient's tolerance of                            previous anesthesia was also reviewed. The risks                            and benefits of the procedure and the sedation                            options and risks were discussed with the patient.                            All questions were answered, and informed consent                            was obtained. Prior Anticoagulants: The patient has                            taken no previous anticoagulant or antiplatelet                            agents. ASA Grade Assessment: II - A patient with                            mild systemic disease. After reviewing the risks                            and benefits, the patient was deemed in  satisfactory condition to undergo the procedure.                           After obtaining informed consent, the endoscope was                            passed under direct vision. Throughout the                            procedure, the patient's blood pressure, pulse, and                            oxygen saturations were monitored continuously. The                            Endoscope was introduced through the mouth, and                             advanced to the second part of duodenum. The upper                            GI endoscopy was accomplished without difficulty.                            The patient tolerated the procedure well. Scope In: Scope Out: Findings:                 The examined esophagus was normal.                           The Z-line was regular and was found 44 cm from the                            incisors.                           The gastroesophageal flap valve was visualized                            endoscopically and classified as Hill Grade III                            (minimal fold, loose to endoscope, hiatal hernia                            likely).                           Mild inflammation characterized by erythema was                            found in the gastric fundus and in the gastric                            body. Biopsies were taken with a cold forceps for  Helicobacter pylori testing. Estimated blood loss                            was minimal.                           The incisura, gastric antrum and pylorus were                            normal.                           The examined duodenum was normal. Biopsies were                            taken with a cold forceps for histology. Estimated                            blood loss was minimal. Complications:            No immediate complications. Estimated Blood Loss:     Estimated blood loss was minimal. Impression:               - Normal esophagus.                           - Z-line regular, 44 cm from the incisors.                           - Gastroesophageal flap valve classified as Hill                            Grade III (minimal fold, loose to endoscope, hiatal                            hernia likely).                           - Gastritis. Biopsied.                           - Normal incisura, antrum and pylorus.                           - Normal examined  duodenum. Biopsied. Recommendation:           - Patient has a contact number available for                            emergencies. The signs and symptoms of potential                            delayed complications were discussed with the                            patient. Return to normal activities tomorrow.  Written discharge instructions were provided to the                            patient.                           - Resume previous diet.                           - Continue present medications.                           - Await pathology results.                           - Continue to avoid aspirin, ibuprofen, naproxen,                            or other non-steroidal anti-inflammatory drugs.                           - Return to GI clinic PRN. Doristine LocksVito Domanique Luckett, MD 03/09/2020 5:06:18 PM

## 2020-03-09 NOTE — Progress Notes (Signed)
Called to room to assist during endoscopic procedure.  Patient ID and intended procedure confirmed with present staff. Received instructions for my participation in the procedure from the performing physician.  

## 2020-03-09 NOTE — Progress Notes (Signed)
Report given to PACU, vss 

## 2020-03-10 ENCOUNTER — Telehealth: Payer: Self-pay

## 2020-03-10 NOTE — Telephone Encounter (Signed)
Prior authorization for Depo-Estradiol approved by patient's insurance from 01/02/20-12/31/20.   Left a detailed vm msg on pharmacist voicemail. Direct call back info provided.

## 2020-03-10 NOTE — Telephone Encounter (Signed)
Prior authorization for Depo-estradiol approved from pt's insurance. Approval letter to follow.   Left a detailed vm msg on the pharmacist line. Direct call back info provided.

## 2020-03-10 NOTE — Telephone Encounter (Signed)
Prior authorization for depo-estradiol submitted to patient's insurance. Pending a determination.  Side note:  Preferred formularies are:  Delestrogen Estradiol Valerate

## 2020-03-11 ENCOUNTER — Telehealth: Payer: Self-pay

## 2020-03-11 NOTE — Telephone Encounter (Signed)
  Follow up Call-  Call back number 03/09/2020  Post procedure Call Back phone  # [phone]  Permission to leave phone message Yes     Patient questions:  Do you have a fever, pain , or abdominal swelling? No. Pain Score  0 *  Have you tolerated food without any problems? Yes.    Have you been able to return to your normal activities? Yes.    Do you have any questions about your discharge instructions: Diet   No. Medications  No. Follow up visit  No.  Do you have questions or concerns about your Care? No.  Actions: * If pain score is 4 or above: No action needed, pain <4. 1. Have you developed a fever since your procedure? no  2.   Have you had an respiratory symptoms (SOB or cough) since your procedure? no  3.   Have you tested positive for COVID 19 since your procedure no  4.   Have you had any family members/close contacts diagnosed with the COVID 19 since your procedure?  no   If yes to any of these questions please route to Laverna Peace, RN and Karlton Lemon, RN

## 2020-03-14 DIAGNOSIS — R69 Illness, unspecified: Secondary | ICD-10-CM | POA: Diagnosis not present

## 2020-03-17 ENCOUNTER — Other Ambulatory Visit: Payer: Self-pay | Admitting: Neurology

## 2020-03-18 ENCOUNTER — Encounter: Payer: Self-pay | Admitting: Gastroenterology

## 2020-03-21 DIAGNOSIS — R69 Illness, unspecified: Secondary | ICD-10-CM | POA: Diagnosis not present

## 2020-03-28 ENCOUNTER — Other Ambulatory Visit: Payer: Self-pay

## 2020-03-28 ENCOUNTER — Encounter: Payer: Self-pay | Admitting: Osteopathic Medicine

## 2020-03-28 ENCOUNTER — Ambulatory Visit (INDEPENDENT_AMBULATORY_CARE_PROVIDER_SITE_OTHER): Payer: Medicare HMO | Admitting: Osteopathic Medicine

## 2020-03-28 ENCOUNTER — Ambulatory Visit (INDEPENDENT_AMBULATORY_CARE_PROVIDER_SITE_OTHER): Payer: Medicare HMO

## 2020-03-28 VITALS — BP 107/57 | HR 87 | Temp 97.7°F | Wt 295.1 lb

## 2020-03-28 DIAGNOSIS — G8929 Other chronic pain: Secondary | ICD-10-CM

## 2020-03-28 DIAGNOSIS — R69 Illness, unspecified: Secondary | ICD-10-CM | POA: Diagnosis not present

## 2020-03-28 DIAGNOSIS — M25511 Pain in right shoulder: Secondary | ICD-10-CM

## 2020-03-28 IMAGING — DX DG SHOULDER 2+V*R*
3 series · 3 of 3 positions shown · non-contrast
Comparison: None.

CLINICAL DATA: Right shoulder pain and limited range of motion for
1 month. No known injury.

EXAM:
RIGHT SHOULDER - 2+ VIEW

[shoulder grashey]
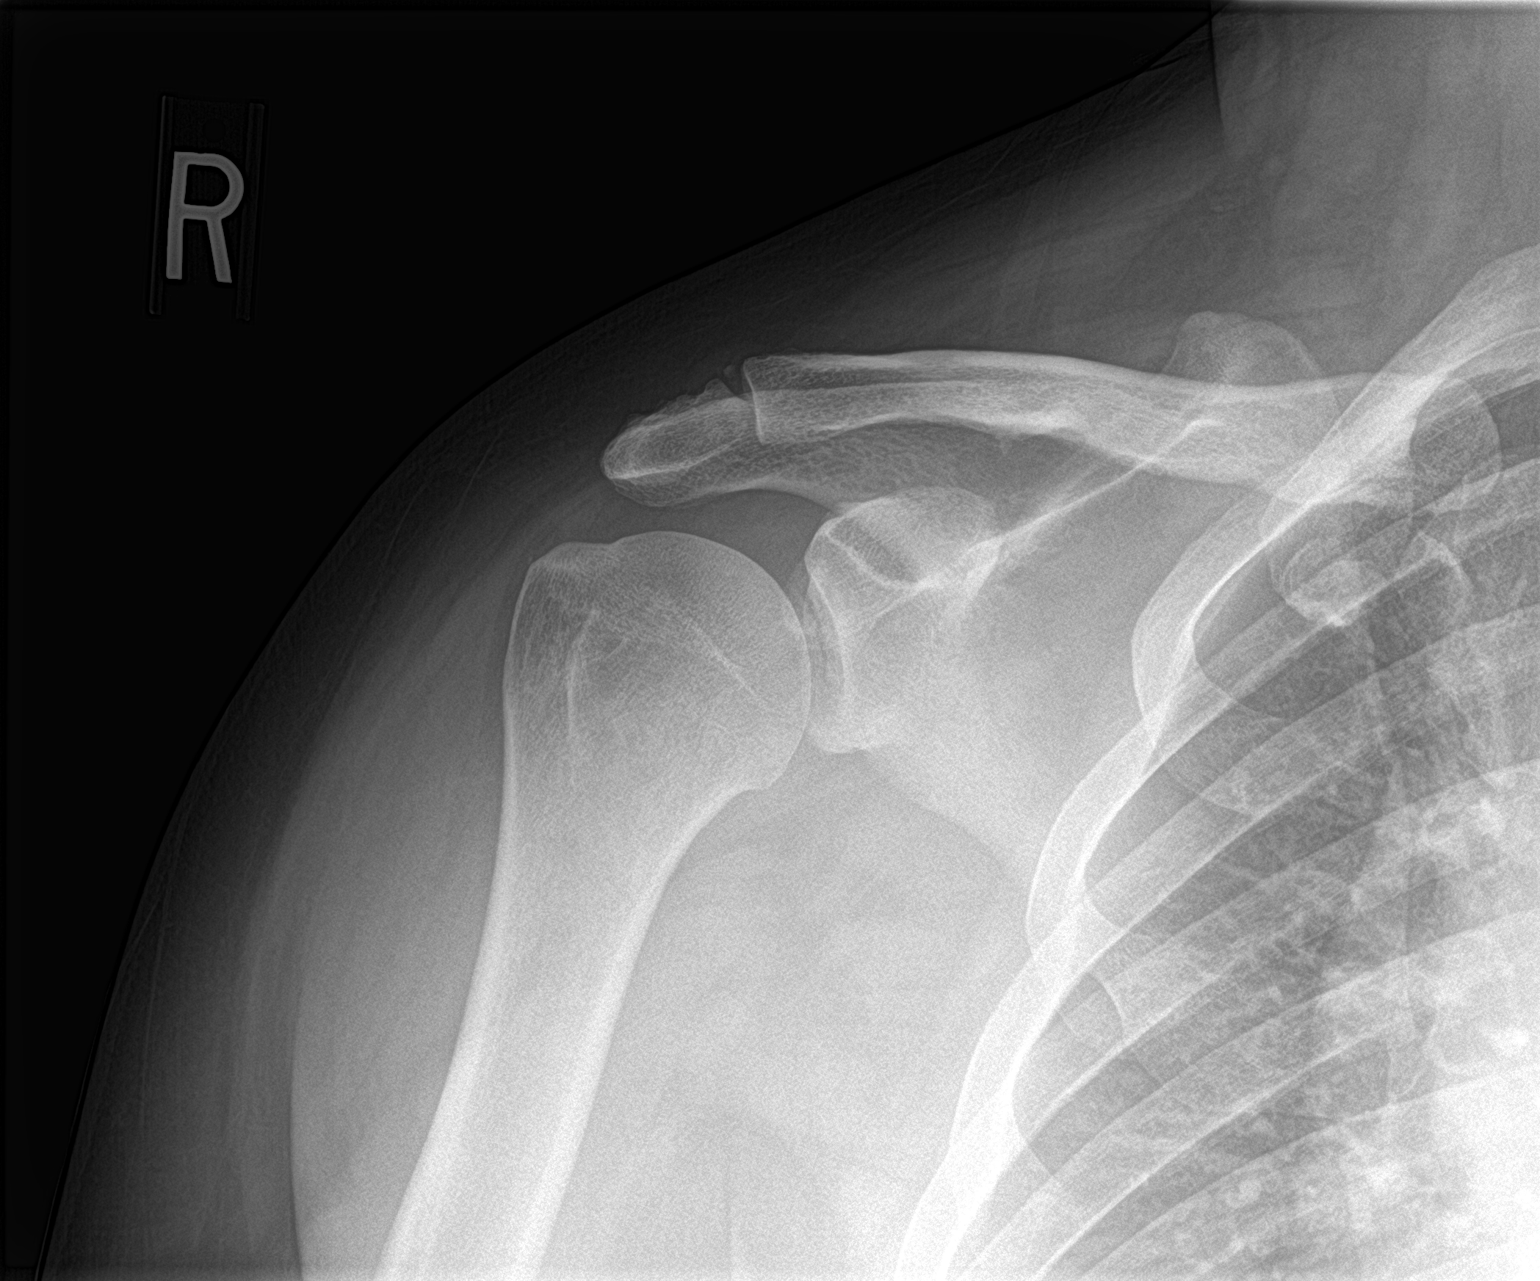

[shoulder y view]
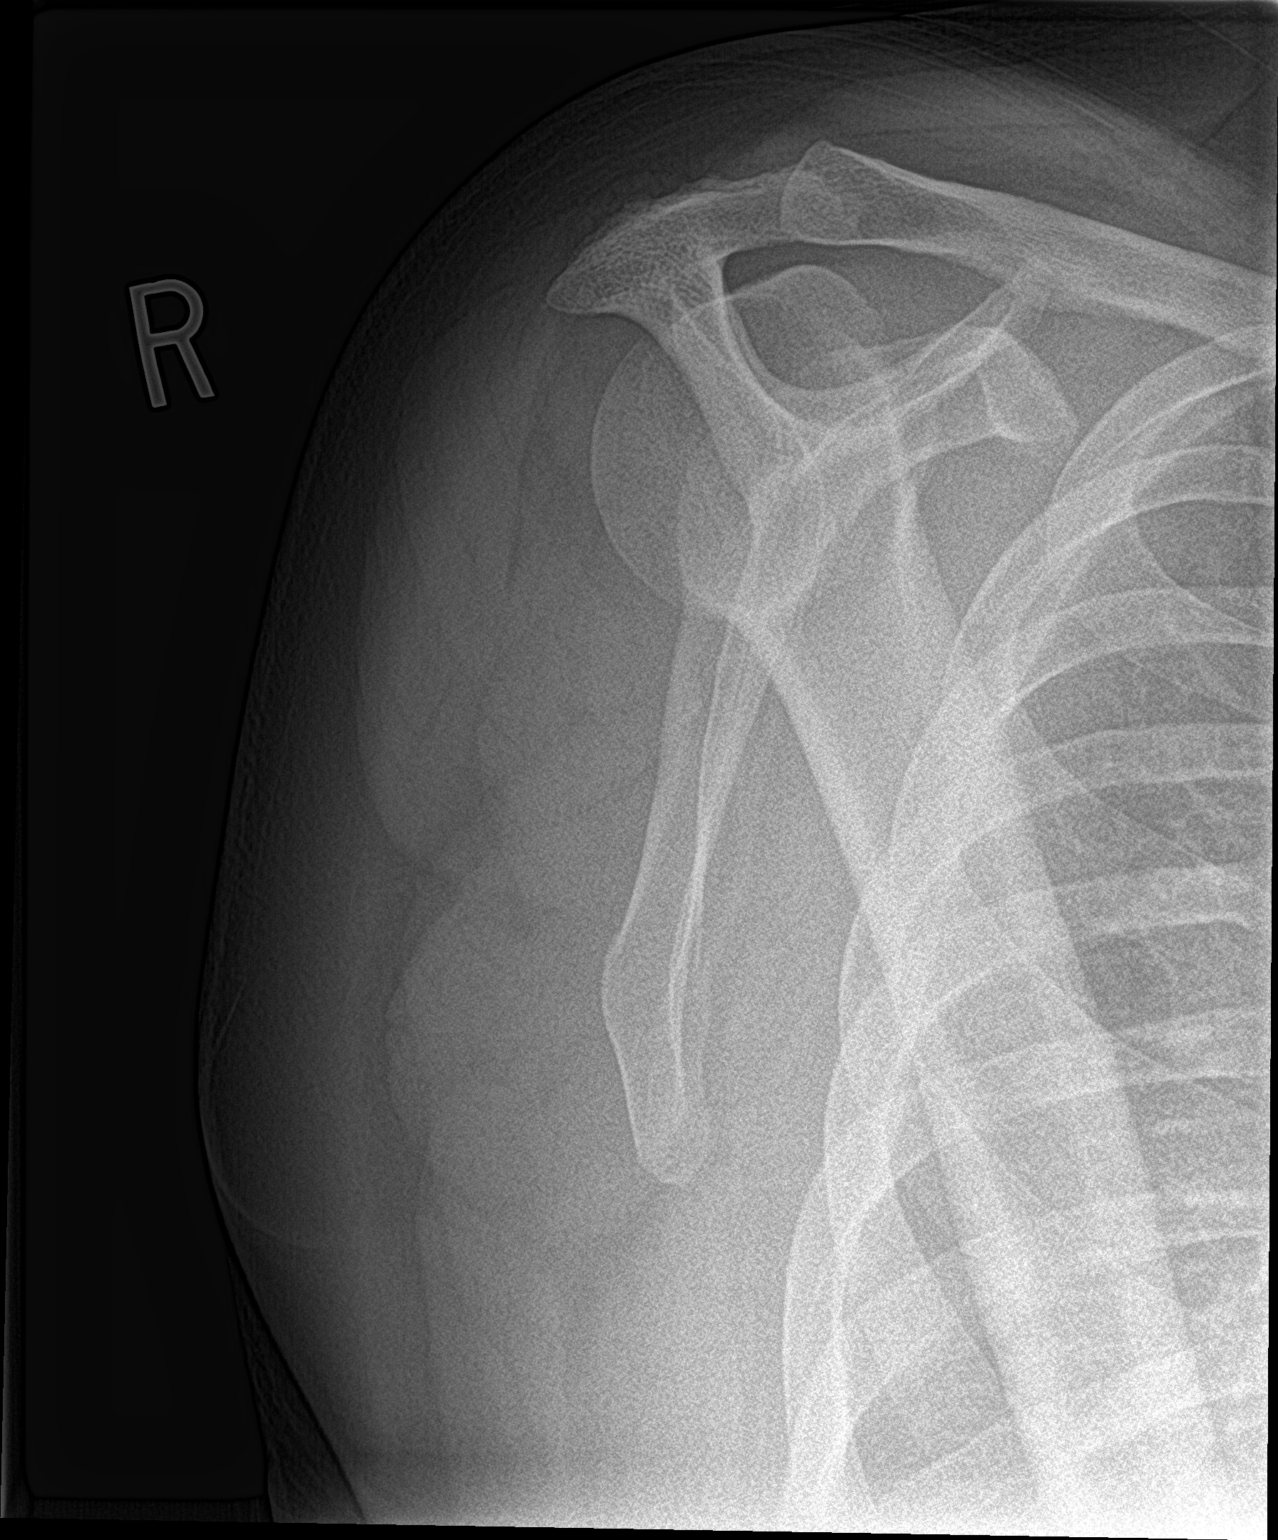

[shoulder axillary]
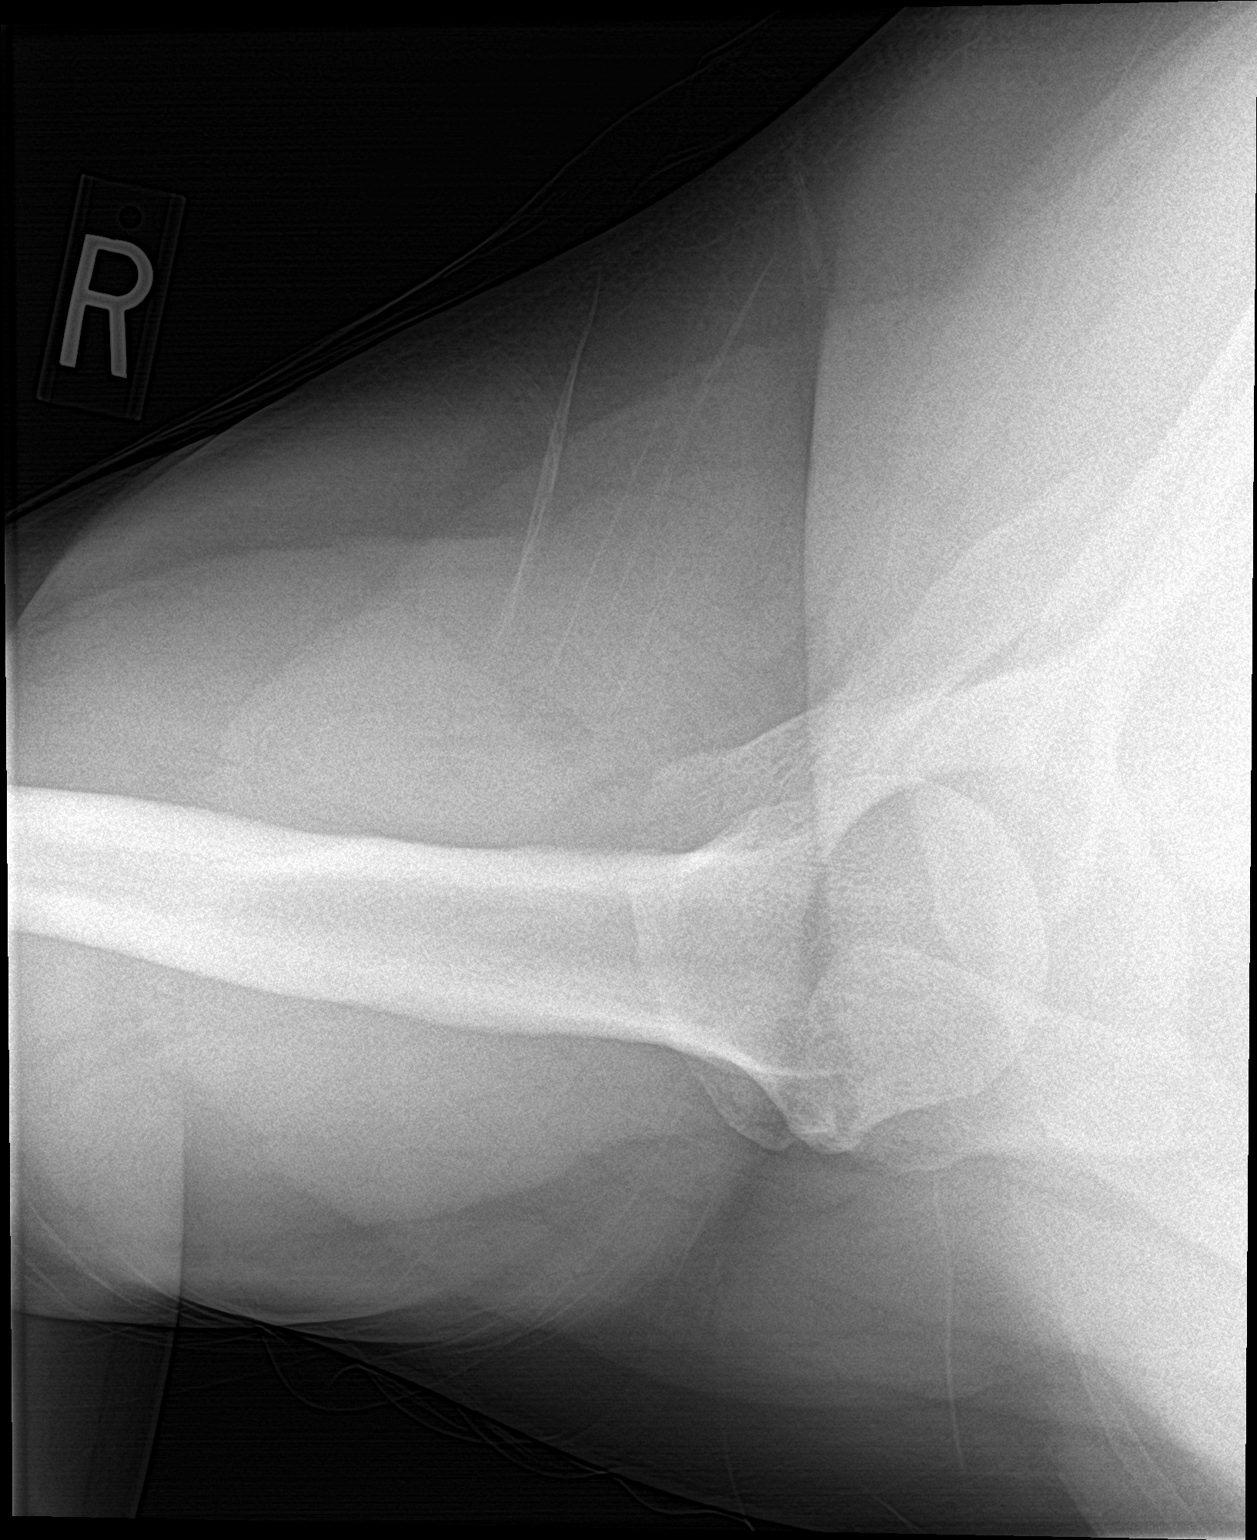

[3 of 3 positions shown; findings below may reference images not displayed]

FINDINGS: There is no evidence of fracture or dislocation. Acromioclavicular
osteoarthritis appears very mild. The glenohumeral joint appears
normal. Soft tissues are unremarkable.
IMPRESSION: Very mild appearing acromioclavicular osteoarthritis. Otherwise
negative.

## 2020-03-28 MED ORDER — TRAMADOL HCL 50 MG PO TABS: 50.0000 mg | ORAL_TABLET | Freq: Three times a day (TID) | ORAL | 0 refills | Status: AC | PRN

## 2020-03-28 MED ORDER — NAPROXEN 500 MG PO TABS: 500.0000 mg | ORAL_TABLET | Freq: Two times a day (BID) | ORAL | 1 refills | Status: DC

## 2020-03-28 MED ORDER — KETOROLAC TROMETHAMINE 60 MG/2ML IM SOLN
60.0000 mg | Freq: Once | INTRAMUSCULAR | Status: AC
  Administered 2020-03-28: 60 mg via INTRAMUSCULAR

## 2020-03-28 NOTE — Patient Instructions (Addendum)
Since pain is severe and range of motion so limited, plus this has been ongoing, I'm concerned for one or more of several problems including bursitis, frozen shoulder, rotator cuff tendon problem, arthritis. This may require steroid injection - will get you set up with Dr T our sports medicine doctor. Will send pain medications and anti-inflammatories today. Will do shot of antiinflammatory Toradol in office today and can start the Tramadol pill once you have it, Tramadol + Naproxen tomorrow. Will get X-rays today.

## 2020-03-28 NOTE — Progress Notes (Signed)
Gary Sullivan is a 45 y.o. adult who presents to  [facility] at [facility]  today, 03/28/20, seeking care for the following:  . R shoulder pain ongoing about a month, getting worse. Limited exam d/t pain in all planes of motion. Pt is unable to abduct past about 60 degrees, unable to perform apley scratch, (+)apprehension posterior and anterior. Unable to lift/carry. No inciting injury      ASSESSMENT & PLAN with other pertinent findings:  The encounter diagnosis was Chronic right shoulder pain.    Patient Instructions  Since pain is severe and range of motion so limited, plus this has been ongoing, I'm concerned for one or more of several problems including bursitis, frozen shoulder, rotator cuff tendon problem, arthritis. This may require steroid injection - will get you set up with Dr T our sports medicine doctor. Will send pain medications and anti-inflammatories today. Will do shot of antiinflammatory Toradol in office today and can start the Tramadol pill once you have it, Tramadol + Naproxen tomorrow. Will get X-rays today.        Orders Placed This Encounter  Procedures  . DG Shoulder Right    Meds ordered this encounter  Medications  . traMADol (ULTRAM) 50 MG tablet    Sig: Take 1-2 tablets (50-100 mg total) by mouth every 8 (eight) hours as needed for up to 5 days for severe pain.    Dispense:  15 tablet    Refill:  0  . naproxen (NAPROSYN) 500 MG tablet    Sig: Take 1 tablet (500 mg total) by mouth 2 (two) times daily with a meal. Take every day for one week, then use as needed after that    Dispense:  60 tablet    Refill:  1  . ketorolac (TORADOL) injection 60 mg     See below for relevant physical exam findings  See below for recent lab and imaging results reviewed  Medications, allergies, PMH, PSH, SocH, FamH reviewed below    Follow-up instructions: Return for VISIT WITH SPORTS MEDICINE FOR SHOULDER ISSUE ASAP  .                                        Exam:  BP (!) 107/57 (BP Location: Left Arm, Patient Position: Sitting, Cuff Size: Normal)   Pulse 87   Temp 97.7 F (36.5 C) (Oral)   Wt 295 lb 1.3 oz (133.8 kg)   BMI 42.34 kg/m   Constitutional: VS see above. General Appearance: alert, well-developed, well-nourished, NAD  Neck: No masses, trachea midline.   Respiratory: Normal respiratory effort. no wheeze, no rhonchi, no rales  Cardiovascular: S1/S2 normal, no murmur, no rub/gallop auscultated. RRR.   Musculoskeletal: Gait normal. See above re: shoulder exam on R   Neurological: Normal balance/coordination. No tremor.  Skin: warm, dry, intact.   Psychiatric: Normal judgment/insight. Normal mood and affect. Oriented x3.   Current Meds  Medication Sig  . amitriptyline (ELAVIL) 25 MG tablet Take 25 mg by mouth at bedtime.  Marland Kitchen atenolol (TENORMIN) 50 MG tablet Take 1 tablet (50 mg total) by mouth daily.  Marland Kitchen azelastine (ASTELIN) 0.1 % nasal spray   . bicalutamide (CASODEX) 50 MG tablet Take 50 mg by mouth daily.  Marland Kitchen buPROPion HCl ER, XL, 450 MG TB24 TAKE 1 TABLET(450MG ) BY MOUTH EVERY MORNING  . Cholecalciferol (VITAMIN D3 PO) Take  50,000 Units by mouth once a week.  . cholestyramine (QUESTRAN) 4 g packet Take 1 packet (4 g total) by mouth daily.  . cyclobenzaprine (FLEXERIL) 10 MG tablet Take 1 tablet (10 mg total) by mouth 3 (three) times daily as needed for muscle spasms. MILD/MODERATE SPASM  . DEPO-ESTRADIOL 5 MG/ML injection Inject 0.8 mLs (4 mg total) into the muscle once a week. (SUBQ)  . dexlansoprazole (DEXILANT) 60 MG capsule Take 1 capsule (60 mg total) by mouth daily.  Marland Kitchen DIGESTIVE ENZYMES PO Take 1 Dose by mouth daily. Digestive enzymes/probiotic  . diphenhydrAMINE (BENADRYL) 25 mg capsule Take 1 capsule (25 mg total) by mouth every 6 (six) hours as needed (headache).  . DULoxetine (CYMBALTA) 60 MG capsule Take 60 mg by mouth daily.  .  fenofibrate 160 MG tablet TAKE 1 TABLET(160 MG) BY MOUTH DAILY  . finasteride (PROPECIA) 1 MG tablet Take 1 mg by mouth daily.  Marland Kitchen glycopyrrolate (ROBINUL) 1 MG tablet Take 2 tablets (2 mg total) by mouth 2 (two) times daily.  Marland Kitchen ibuprofen (ADVIL) 800 MG tablet TAKE 1 TABLET(800 MG) BY MOUTH THREE TIMES DAILY  . Insulin Pen Needle (B-D ULTRAFINE III SHORT PEN) 31G X 8 MM MISC USE AS DIRECTED  . ketorolac (TORADOL) 10 MG tablet Take 1 tablet (10 mg total) by mouth every 6 (six) hours as needed (headache).  . lisinopril (ZESTRIL) 5 MG tablet Take 1 tablet (5 mg total) by mouth daily.  . metoCLOPramide (REGLAN) 10 MG tablet Take 1 tablet (10 mg total) by mouth every 6 (six) hours as needed (headache).  . naproxen (NAPROSYN) 500 MG tablet Take 1 tablet (500 mg total) by mouth 2 (two) times daily with a meal. Take every day for one week, then use as needed after that  . NEEDLE, DISP, 18 G (B-D HYPODERMIC NEEDLE 18GX1.5") 18G X 1-1/2" MISC Use 1 Needle free injection every 7 (seven) days To draw up estrogen  . Omega-3 1000 MG CAPS Take by mouth.  . primidone (MYSOLINE) 50 MG tablet Take 1 tablet (50 mg total) by mouth 2 (two) times daily.  . Semaglutide,0.25 or 0.5MG /DOS, 2 MG/1.5ML SOPN Inject 0.5 mg into the skin once a week.  . spironolactone (ALDACTONE) 50 MG tablet Take 1 tablet (50 mg total) by mouth 2 (two) times daily.  . SUMAtriptan (IMITREX) 50 MG tablet Take 1 tablet (50 mg total) by mouth every 2 (two) hours as needed for migraine. May repeat in 2 hours if headache persists or recurs. Max 150 mg per 24 hours.  . topiramate (TOPAMAX) 50 MG tablet TAKE 1 TO 2 TABLETS BY MOUTH EVERY NIGHT AT BEDTIME. Future refill requests should go to PCP  . traMADol (ULTRAM) 50 MG tablet Take 1-2 tablets (50-100 mg total) by mouth every 8 (eight) hours as needed for up to 5 days for severe pain.   Current Facility-Administered Medications for the 03/28/20 encounter (Office Visit) with [name], DO   Medication  . 0.9 %  sodium chloride infusion    Allergies  Allergen Reactions  . Molds & Smuts Cough, Hives, Itching and Shortness Of Breath  . Dog Epithelium Allergy Skin Test Itching and Other (See Comments)    Other reaction(s): Other (See Comments)   . Sulfamethoxazole-Trimethoprim Other (See Comments), Diarrhea, Nausea Only and Rash    Other reaction(s): Abdominal Pain   . Grass Pollen(K-O-R-T-Swt Vern)   . Sulfa Antibiotics     Pain, upset stomach, vomiting  . Cat Hair Extract Hives, Itching  and Rash    Patient Active Problem List   Diagnosis Date Noted  . Broken or cracked tooth, nontraumatic 02/09/2020  . Poor dentition 02/09/2020  . Tooth pain 02/09/2020  . History of prediabetes 06/04/2019  . Transgender 04/21/2019  . Carpal tunnel syndrome on right 10/07/2018  . OSA (obstructive sleep apnea) 06/19/2018  . Excessive daytime sleepiness 06/19/2018  . Numbness 05/28/2018  . Tremor 05/28/2018  . Fibromyalgia 05/28/2018  . Depression with anxiety 05/28/2018  . High cholesterol 01/02/2012  . High blood pressure 01/02/2008  . IBS (irritable bowel syndrome) 01/01/2006  . GERD (gastroesophageal reflux disease) 01/01/2005  . Social anxiety disorder 01/02/1988  . Gender dysphoria 01/01/1985  . Asthma 01/01/1982    Family History  Problem Relation Age of Onset  . High blood pressure Mother   . High blood pressure Father   . Irritable bowel syndrome Father   . High blood pressure Brother   . Pancreatic cancer Maternal Grandfather   . Colon cancer Maternal Grandfather   . Lung cancer Paternal Grandmother   . Diabetes Paternal Grandfather   . Colon cancer Maternal Aunt   . Irritable bowel syndrome Sister   . Irritable bowel syndrome Sister   . Esophageal cancer Neg Hx     Social History   Tobacco Use  Smoking Status Former Smoker  . Types: Cigarettes  Smokeless Tobacco Never Used    Past Surgical History:  Procedure Laterality Date  . CARPAL TUNNEL  RELEASE Right 10/08/2018   Procedure: RIGHT CARPAL TUNNEL RELEASE;  Surgeon: Tarry Kos, MD;  Location: [facility];  Service: Orthopedics;  Laterality: Right;  . CHOLECYSTECTOMY    . COLONOSCOPY     Around 2014. [facility] [place]  . ESOPHAGOGASTRODUODENOSCOPY     Aound 2014 [facility] [place], [place]  . labrum repair    . NASAL SEPTUM SURGERY    . SHOULDER ARTHROSCOPY WITH SUBACROMIAL DECOMPRESSION Left   . TONSILLECTOMY      Immunization History  Administered Date(s) Administered  . Moderna Sars-Covid-2 Vaccination 08/11/2019, 09/05/2019    No results found for this or any previous visit (from the past 2160 hour(s)).  No results found.     All questions at time of visit were answered - patient instructed to contact office with any additional concerns or updates. ER/RTC precautions were reviewed with the patient as applicable.   Please note: manual typing as well as voice recognition software may have been used to produce this document - typos may escape review. Please contact Dr. Lyn Hollingshead for any needed clarifications.

## 2020-03-29 ENCOUNTER — Ambulatory Visit (INDEPENDENT_AMBULATORY_CARE_PROVIDER_SITE_OTHER): Payer: Medicare HMO | Admitting: Physician Assistant

## 2020-03-29 ENCOUNTER — Ambulatory Visit (INDEPENDENT_AMBULATORY_CARE_PROVIDER_SITE_OTHER): Payer: Medicare HMO | Admitting: Sports Medicine

## 2020-03-29 ENCOUNTER — Encounter: Payer: Self-pay | Admitting: Physician Assistant

## 2020-03-29 ENCOUNTER — Ambulatory Visit (INDEPENDENT_AMBULATORY_CARE_PROVIDER_SITE_OTHER): Payer: Medicare HMO

## 2020-03-29 VITALS — BP 93/48 | HR 80 | Temp 97.7°F | Ht 70.0 in | Wt 300.0 lb

## 2020-03-29 DIAGNOSIS — M25511 Pain in right shoulder: Secondary | ICD-10-CM | POA: Insufficient documentation

## 2020-03-29 DIAGNOSIS — M7541 Impingement syndrome of right shoulder: Secondary | ICD-10-CM

## 2020-03-29 DIAGNOSIS — I959 Hypotension, unspecified: Secondary | ICD-10-CM | POA: Diagnosis not present

## 2020-03-29 DIAGNOSIS — J0100 Acute maxillary sinusitis, unspecified: Secondary | ICD-10-CM | POA: Diagnosis not present

## 2020-03-29 DIAGNOSIS — G8929 Other chronic pain: Secondary | ICD-10-CM | POA: Insufficient documentation

## 2020-03-29 MED ORDER — FLUTICASONE PROPIONATE 50 MCG/ACT NA SUSP: 2.0000 | Freq: Every day | NASAL | 1 refills | Status: DC

## 2020-03-29 MED ORDER — AMOXICILLIN-POT CLAVULANATE 875-125 MG PO TABS: 1.0000 | ORAL_TABLET | Freq: Two times a day (BID) | ORAL | 0 refills | Status: DC

## 2020-03-29 NOTE — Assessment & Plan Note (Signed)
This is a pleasant 45 year old male, they have had right shoulder pain for some time now, localized over the deltoid and worse with abduction. Failed NSAIDs, activity modification, waking her from sleep. Impingement signs on exam, positive Neer's, Hawkins, empty can signs. History of a left-sided labral tear post trauma. Today we did a subacromial injection, adding formal physical therapy.

## 2020-03-29 NOTE — Progress Notes (Signed)
Subjective:    Patient ID: Gary Sullivan, adult    DOB: 12/21/1975, 45 y.o.   MRN: [id number]  HPI  Pt presents to the clinic with sinus pressure and congestion for the last week. She also just saw Dr. Karie Schwalbe for injection in right shoulder.   She admits to feeling "run down". No new medication started. Completed covid vaccination. Lots of yellow-green sputum. No chest tightness, SOB, wheezing. Sinus tenderness and headache. Taking OTC tylenol cold sinus severe with little relief. Symptoms over 2 weeks.  No fever, nausea, vomiting, GI symptoms, body aches.    .. Active Ambulatory Problems    Diagnosis Date Noted  . Numbness 05/28/2018  . Tremor 05/28/2018  . Fibromyalgia 05/28/2018  . Depression with anxiety 05/28/2018  . OSA (obstructive sleep apnea) 06/19/2018  . Excessive daytime sleepiness 06/19/2018  . Asthma 01/01/1982  . Gender dysphoria 01/01/1985  . GERD (gastroesophageal reflux disease) 01/01/2005  . High blood pressure 01/02/2008  . High cholesterol 01/02/2012  . IBS (irritable bowel syndrome) 01/01/2006  . Social anxiety disorder 01/02/1988  . Carpal tunnel syndrome on right 10/07/2018  . Transgender 04/21/2019  . History of prediabetes 06/04/2019  . Broken or cracked tooth, nontraumatic 02/09/2020  . Poor dentition 02/09/2020  . Tooth pain 02/09/2020  . Impingement syndrome, shoulder, right 03/29/2020   Resolved Ambulatory Problems    Diagnosis Date Noted  . Class 3 severe obesity in adult [facility]) 06/04/2018   Past Medical History:  Diagnosis Date  . Allergy   . Anxiety   . Depression   . Diabetes mellitus without complication (HCC)   . Elevated heart rate with elevated blood pressure and diagnosis of hypertension   . Environmental allergies   . Hyperhidrosis   . Hypertension   . Sleep apnea        Review of Systems See HPI.     Objective:   Physical Exam Vitals reviewed.  Constitutional:      Appearance: Normal appearance. She is obese.  HENT:      Head: Normocephalic.     Comments: Tenderness over maxillary sinuses to palpation.     Right Ear: Tympanic membrane, ear canal and external ear normal. There is no impacted cerumen.     Left Ear: Tympanic membrane, ear canal and external ear normal. There is no impacted cerumen.     Nose: Congestion present.     Mouth/Throat:     Mouth: Mucous membranes are moist.     Pharynx: Oropharynx is clear.  Cardiovascular:     Rate and Rhythm: Normal rate and regular rhythm.     Pulses: Normal pulses.     Heart sounds: Normal heart sounds.  Pulmonary:     Effort: Pulmonary effort is normal.     Breath sounds: Normal breath sounds.  Skin:    Coloration: Skin is pale.  Neurological:     General: No focal deficit present.     Mental Status: She is alert and oriented to person, place, and time.  Psychiatric:        Mood and Affect: Mood normal.           Assessment & Plan:  Marland KitchenMarland KitchenThaddaeus was seen today for sinus problem.  Diagnoses and all orders for this visit:  Acute non-recurrent maxillary sinusitis -     amoxicillin-clavulanate (AUGMENTIN) 875-125 MG tablet; Take 1 tablet by mouth 2 (two) times daily. -     fluticasone (FLONASE) 50 MCG/ACT nasal spray; Place 2 sprays into both nostrils daily.  Hypotension, unspecified hypotension type   BP low. Hx of low BP but really low today. Increase hydration and eat some salt today.  Watch BP. Could be a little lower after injection.  Treated sinusitis with augmentin and flonase.  Symptomatic treatment discussed.  Follow up as needed or if worsening symptoms.

## 2020-03-29 NOTE — Progress Notes (Signed)
    Procedures performed today:    Procedure: Real-time Ultrasound Guided injection of the right subacromial bursa Device: Samsung [id number]  Verbal informed consent obtained.  Time-out conducted.  Noted no overlying erythema, induration, or other signs of local infection.  Skin prepped in a sterile fashion.  Local anesthesia: Topical Ethyl chloride.  With sterile technique and under real time ultrasound guidance:  Noted intact rotator cuff, 1 cc Kenalog 40, 1 cc lidocaine, 1 cc bupivacaine injected easily Completed without difficulty  Advised to call if fevers/chills, erythema, induration, drainage, or persistent bleeding.  Images permanently stored and available for review in PACS.  Impression: Technically successful ultrasound guided injection.  Independent interpretation of notes and tests performed by another provider:   None.  Brief History, Exam, Impression, and Recommendations:    Impingement syndrome, shoulder, right This is a pleasant 45 year old male, they have had right shoulder pain for some time now, localized over the deltoid and worse with abduction. Failed NSAIDs, activity modification, waking her from sleep. Impingement signs on exam, positive Neer's, Hawkins, empty can signs. History of a left-sided labral tear post trauma. Today we did a subacromial injection, adding formal physical therapy.     ___________________________________________ [name]. Benjamin Stain, M.D., ABFM., CAQSM. Primary Care and Sports Medicine [facility]  Adjunct Instructor of Family Medicine  [employer]

## 2020-03-29 NOTE — Patient Instructions (Signed)

## 2020-03-30 ENCOUNTER — Encounter: Payer: Self-pay | Admitting: Osteopathic Medicine

## 2020-03-30 DIAGNOSIS — R41840 Attention and concentration deficit: Secondary | ICD-10-CM

## 2020-04-12 ENCOUNTER — Other Ambulatory Visit: Payer: Self-pay | Admitting: Osteopathic Medicine

## 2020-04-12 DIAGNOSIS — K0381 Cracked tooth: Secondary | ICD-10-CM

## 2020-04-12 DIAGNOSIS — K0889 Other specified disorders of teeth and supporting structures: Secondary | ICD-10-CM

## 2020-04-12 DIAGNOSIS — K089 Disorder of teeth and supporting structures, unspecified: Secondary | ICD-10-CM

## 2020-04-12 DIAGNOSIS — K047 Periapical abscess without sinus: Secondary | ICD-10-CM

## 2020-04-12 DIAGNOSIS — R69 Illness, unspecified: Secondary | ICD-10-CM | POA: Diagnosis not present

## 2020-04-14 ENCOUNTER — Other Ambulatory Visit: Payer: Self-pay | Admitting: Neurology

## 2020-04-19 DIAGNOSIS — R69 Illness, unspecified: Secondary | ICD-10-CM | POA: Diagnosis not present

## 2020-04-25 DIAGNOSIS — R69 Illness, unspecified: Secondary | ICD-10-CM | POA: Diagnosis not present

## 2020-05-03 DIAGNOSIS — R69 Illness, unspecified: Secondary | ICD-10-CM | POA: Diagnosis not present

## 2020-05-06 ENCOUNTER — Other Ambulatory Visit: Payer: Self-pay | Admitting: Osteopathic Medicine

## 2020-05-06 NOTE — Telephone Encounter (Signed)
Routing to covering provider. Thanks in advance.  

## 2020-05-09 ENCOUNTER — Telehealth: Payer: Self-pay | Admitting: Sports Medicine

## 2020-05-09 NOTE — Telephone Encounter (Signed)
Routing to covering provider.  °

## 2020-05-09 NOTE — Telephone Encounter (Signed)
Katha Cabal, [employer] sent to Monica Becton, MD Hi Dr. Benjamin Stain,   I am a [facility] clinical pharmacist that reviews patients for statin quality initiatives.    Per review of chart and payor information, patient has a diagnosis of prediabetes (updated on problems list on 06/04/2019 w/ CPT code Z87.898) but is not currently filling a statin prescription as he is on fenofibrate. This places patient into the SUPD (Statin Use in Patients with Diabetes) measure for CMS. Patient is currently taking Ozempic, I presume d/t being obese and prediabetes is mentioned in several notes. A1c 5.4% on 04/22/19 ; CPT code Z87.898 (prediabetes) will not remove patient from measure. Patient does have a h/o elevated LFTs but most recent labs (11/2019) were WNL.   Please consider dx code R73.03 for prediabetes (pending A1c) or starting a statin (pending lipid/hepatic panel AND discontinuing fenofibrate d/t statin drug interaction), by adding to problem list and associating code during encounter, for this year on the next scheduled appointment on 05/10/2020 to exclude the patient from this year's measure.   Thank you for allowing pharmacy to be a part of this patient's care.   Cephus Shelling, PharmD  Clinical Pharmacist  [facility]  Cell: [phone]

## 2020-05-10 ENCOUNTER — Other Ambulatory Visit: Payer: Self-pay

## 2020-05-10 ENCOUNTER — Ambulatory Visit (INDEPENDENT_AMBULATORY_CARE_PROVIDER_SITE_OTHER): Payer: Medicare HMO | Admitting: Sports Medicine

## 2020-05-10 DIAGNOSIS — M25511 Pain in right shoulder: Secondary | ICD-10-CM | POA: Diagnosis not present

## 2020-05-10 DIAGNOSIS — G8929 Other chronic pain: Secondary | ICD-10-CM | POA: Diagnosis not present

## 2020-05-10 DIAGNOSIS — R69 Illness, unspecified: Secondary | ICD-10-CM | POA: Diagnosis not present

## 2020-05-10 DIAGNOSIS — M2550 Pain in unspecified joint: Secondary | ICD-10-CM | POA: Diagnosis not present

## 2020-05-10 MED ORDER — HYDROCODONE-ACETAMINOPHEN 10-325 MG PO TABS: 1.0000 | ORAL_TABLET | Freq: Three times a day (TID) | ORAL | 0 refills | Status: DC | PRN

## 2020-05-10 NOTE — Progress Notes (Signed)
    Procedures performed today:    None.  Independent interpretation of notes and tests performed by another provider:   None.  Brief History, Exam, Impression, and Recommendations:    Chronic right shoulder pain Pleasant 45 year old male returns, she continues to have right shoulder pain, this is in spite of a subacromial injection at the last visit At this point she has had greater than 6 weeks of physician directed conservative treatment without improvement, she does have Neer's, Hawkins, empty can signs today, also has a positive O'Brien's and crank test, I am certainly concerned about her labrum. She has had a labral tear on the contralateral side in the past. Proceed with MR arthrography. Return to see me for arthrogram injection, we will do a short course of hydrocodone for pain.  Polyarthralgia I do not think that there is any evidence of a benign joint hypermobility syndrome, or Ehlers-Danlos or Marfan syndrome. However considering multiple aches and pains I think it is reasonable to go ahead and do the full rheumatoid work-up.    ___________________________________________ [name]. Benjamin Stain, M.D., ABFM., CAQSM. Primary Care and Sports Medicine [facility]  Adjunct Instructor of Family Medicine  [employer]

## 2020-05-10 NOTE — Assessment & Plan Note (Signed)
I do not think that there is any evidence of a benign joint hypermobility syndrome, or Ehlers-Danlos or Marfan syndrome. However considering multiple aches and pains I think it is reasonable to go ahead and do the full rheumatoid work-up.

## 2020-05-10 NOTE — Assessment & Plan Note (Signed)
Pleasant 45 year old male returns, she continues to have right shoulder pain, this is in spite of a subacromial injection at the last visit At this point she has had greater than 6 weeks of physician directed conservative treatment without improvement, she does have Neer's, Hawkins, empty can signs today, also has a positive O'Brien's and crank test, I am certainly concerned about her labrum. She has had a labral tear on the contralateral side in the past. Proceed with MR arthrography. Return to see me for arthrogram injection, we will do a short course of hydrocodone for pain.

## 2020-05-16 ENCOUNTER — Other Ambulatory Visit: Payer: Self-pay | Admitting: Osteopathic Medicine

## 2020-05-20 LAB — LUPUS(12) PANEL
Anti Nuclear Antibody (ANA): NEGATIVE
C3 Complement: 138 mg/dL (ref 82–185)
C4 Complement: 31 mg/dL (ref 15–53)
ENA SM Ab Ser-aCnc: <1 AI
Rheumatoid fact SerPl-aCnc: <14 IU/mL (ref <14)
Ribosomal P Protein Ab: <1 AI
SM/RNP: <1 AI
SSA (Ro) (ENA) Antibody, IgG: <1 AI
SSB (La) (ENA) Antibody, IgG: <1 AI
Scleroderma (Scl-70) (ENA) Antibody, IgG: <1 AI
Thyroperoxidase Ab SerPl-aCnc: 1 IU/mL (ref <9)
ds DNA Ab: <1 IU/mL

## 2020-05-20 LAB — RHEUMATOID FACTOR (IGA, IGG, IGM)
Rheumatoid Factor (IgA): <5 U (ref <=6)
Rheumatoid Factor (IgG): <5 U (ref <=6)
Rheumatoid Factor (IgM): <5 U (ref <=6)

## 2020-05-20 LAB — URIC ACID: Uric Acid, Serum: 4.7 mg/dL (ref 4.0–8.0)

## 2020-05-20 LAB — CK: Total CK: 68 U/L (ref 44–196)

## 2020-05-20 LAB — SEDIMENTATION RATE: Sed Rate: 9 mm/h (ref 0–15)

## 2020-05-20 LAB — CYCLIC CITRUL PEPTIDE ANTIBODY, IGG: Cyclic Citrullin Peptide Ab: <16 UNITS

## 2020-05-20 LAB — HLA-B27 ANTIGEN: HLA-B27 Antigen: NEGATIVE

## 2020-05-23 ENCOUNTER — Ambulatory Visit (INDEPENDENT_AMBULATORY_CARE_PROVIDER_SITE_OTHER): Payer: Medicare HMO

## 2020-05-23 ENCOUNTER — Other Ambulatory Visit: Payer: Self-pay

## 2020-05-23 ENCOUNTER — Ambulatory Visit (INDEPENDENT_AMBULATORY_CARE_PROVIDER_SITE_OTHER): Payer: Medicare HMO | Admitting: Sports Medicine

## 2020-05-23 ENCOUNTER — Other Ambulatory Visit: Payer: Self-pay | Admitting: Osteopathic Medicine

## 2020-05-23 DIAGNOSIS — M25511 Pain in right shoulder: Secondary | ICD-10-CM

## 2020-05-23 DIAGNOSIS — G8929 Other chronic pain: Secondary | ICD-10-CM

## 2020-05-23 DIAGNOSIS — M7521 Bicipital tendinitis, right shoulder: Secondary | ICD-10-CM | POA: Diagnosis not present

## 2020-05-23 DIAGNOSIS — R29898 Other symptoms and signs involving the musculoskeletal system: Secondary | ICD-10-CM

## 2020-05-23 DIAGNOSIS — M19011 Primary osteoarthritis, right shoulder: Secondary | ICD-10-CM | POA: Diagnosis not present

## 2020-05-23 IMAGING — MR MR SHOULDER*R* W/CM
5 series · 40 of 40 positions shown · IV contrast (agent unspecified)
Comparison: X-ray [DATE]

CLINICAL DATA: Right shoulder pain, decreased range of motion. No
known injury.

EXAM:
MR ARTHROGRAM OF THE RIGHT SHOULDER
TECHNIQUE: Multiplanar, multisequence MR imaging of the right shoulder was
performed following the administration of intra-articular contrast.
CONTRAST:  See Injection Documentation.

[Series 3: T1 fat-sat · axial · 4.0mm · 0.50mm/px · z∈[-25,+70]mm · 7 of 22 slices shown (1 of 3)]
[im 1/22]
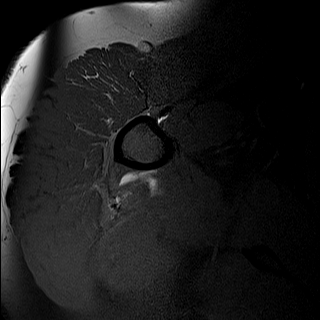
[im 4/22]
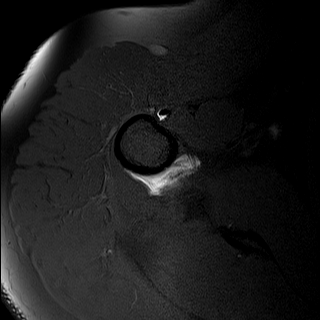
[im 8/22]
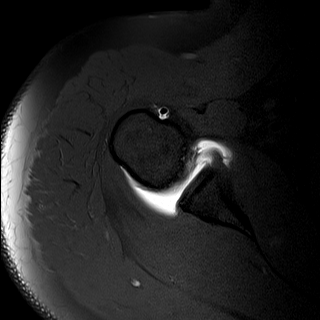
[im 11/22]
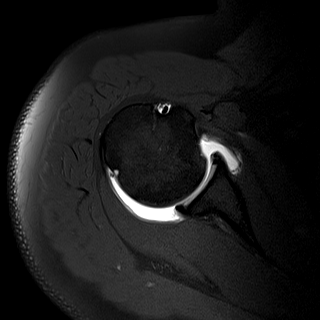
[im 15/22]
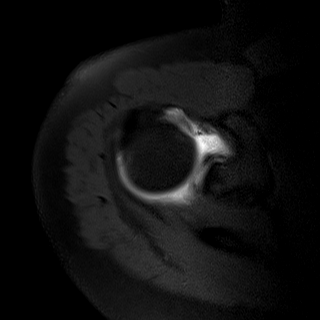
[im 18/22]
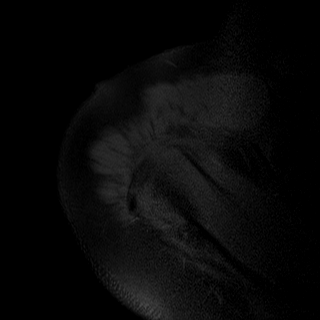
[im 22/22]
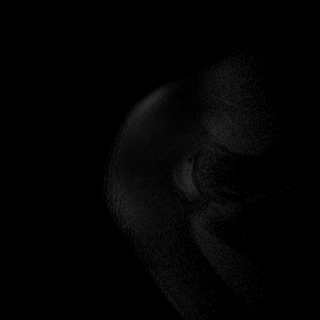

[Series 4: T1 fat-sat · oblique · 3.0mm · 0.50mm/px · 7 of 22 slices shown (2 of 3)]
[im 1/22]
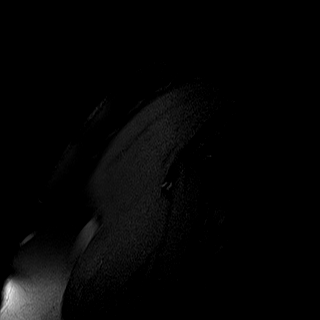
[im 4/22]
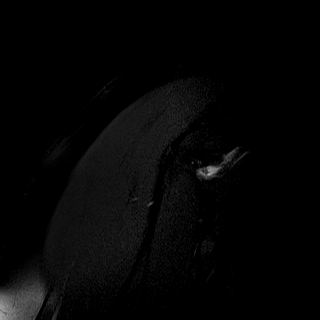
[im 8/22]
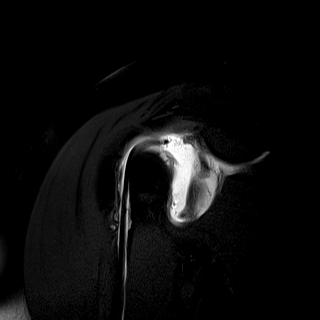
[im 11/22]
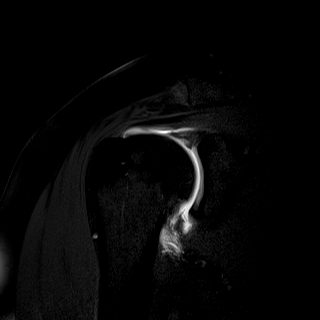
[im 15/22]
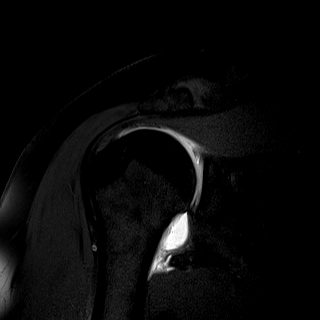
[im 18/22]
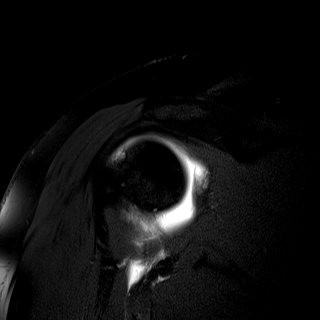
[im 22/22]
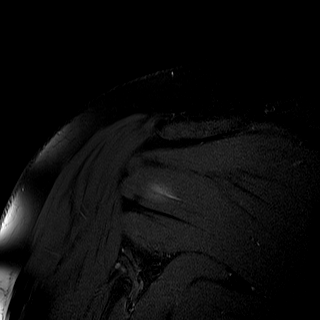

[Series 5: T2 fat-sat · oblique · 3.0mm · 0.62mm/px · 8 of 22 slices shown (1 of 2)]
[im 1/22]
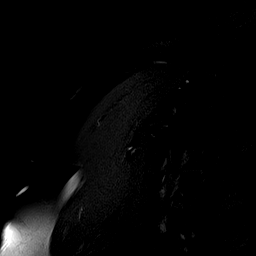
[im 4/22]
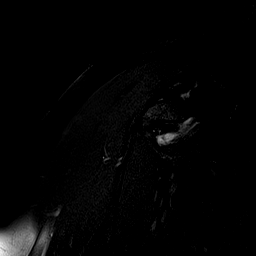
[im 7/22]
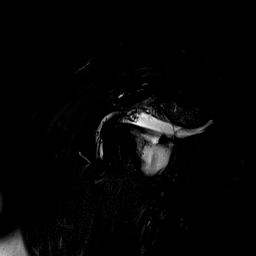
[im 10/22]
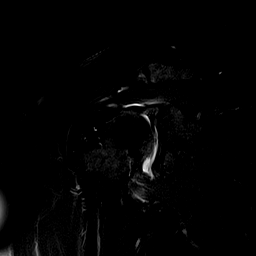
[im 13/22]
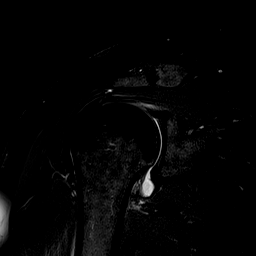
[im 16/22]
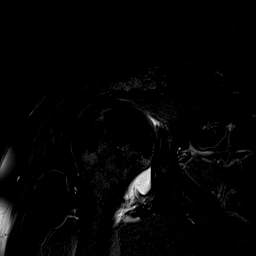
[im 19/22]
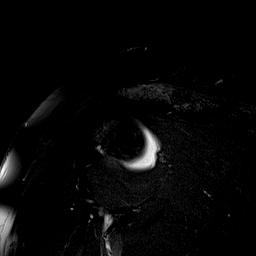
[im 22/22]
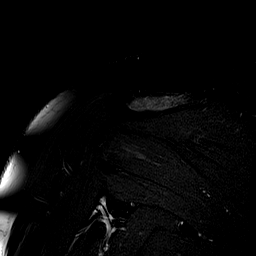

[Series 6: T1 fat-sat · oblique · non-contrast · 3.0mm · 0.62mm/px · 8 of 22 slices shown (3 of 3)]
[im 1/22]
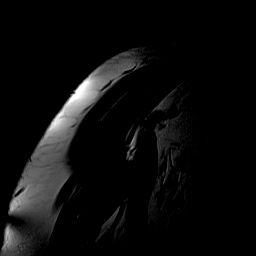
[im 4/22]
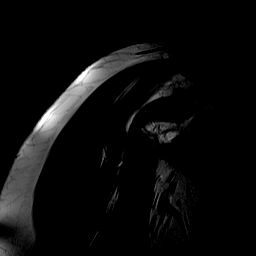
[im 7/22]
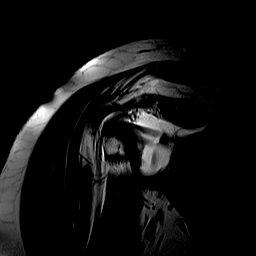
[im 10/22]
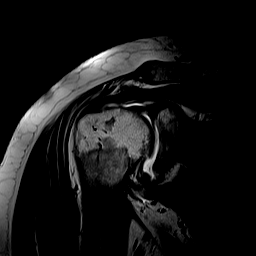
[im 13/22]
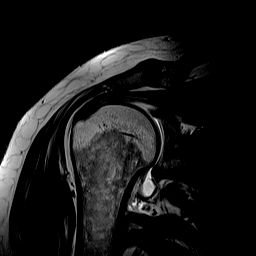
[im 16/22]
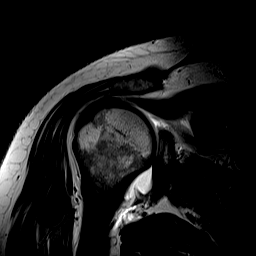
[im 19/22]
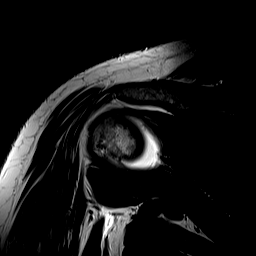
[im 22/22]
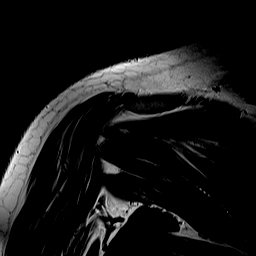

[Series 7: T2 fat-sat · oblique · 3.0mm · 0.62mm/px · 10 of 29 slices shown (2 of 2)]
[im 1/29]
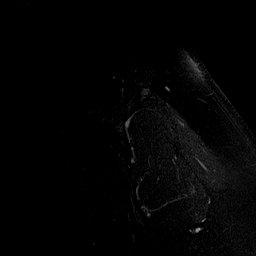
[im 4/29]
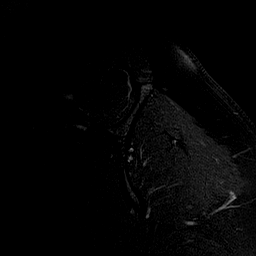
[im 7/29]
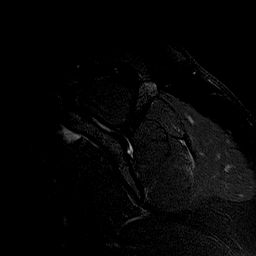
[im 10/29]
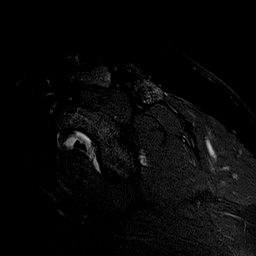
[im 13/29]
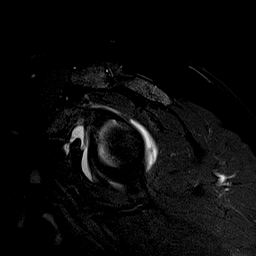
[im 16/29]
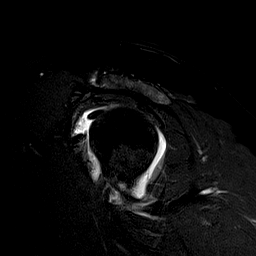
[im 19/29]
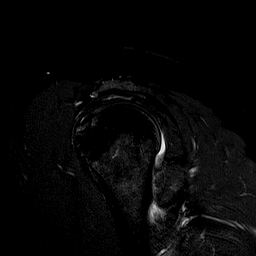
[im 22/29]
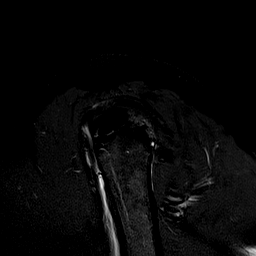
[im 25/29]
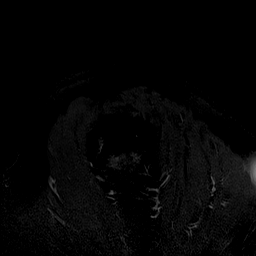
[im 29/29]
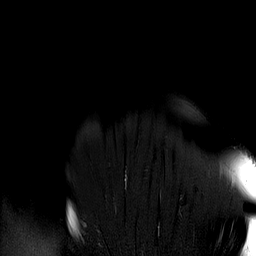

[40 of 40 positions shown; findings below may reference images not displayed]

FINDINGS: Rotator cuff: Mild supraspinatus and subscapularis tendinosis.
Low-grade undersurface fraying/irregularity of the anterior aspect
of the distal supraspinatus tendon (series 5, images 13-14).
Infraspinatus and teres minor tendons intact. No full thickness
rotator cuff tear.

Muscles: Preserved bulk and signal intensity of the rotator cuff
musculature without edema, atrophy, or fatty infiltration.

Biceps long head: Mild intra-articular biceps tendinosis.

Acromioclavicular Joint: Mild arthropathy of the AC joint. No
significant subacromial-subdeltoid bursal fluid. No contrast extends
into the bursal space.

Glenohumeral Joint: Well distended with injected contrast. A small
amount of extracapsular contrast is noted extending inferiorly along
the posteromedial aspect of the humeral shaft is favored injection
related. Glenohumeral ligaments appear otherwise intact.

Labrum: Superior labral degeneration without tear.

Bones: No acute fracture. No dislocation no bone marrow edema. No
marrow replacing bone lesion.

Other: None.
IMPRESSION: 1. Mild supraspinatus and subscapularis tendinosis with low-grade
undersurface fraying/irregularity of the anterior aspect of the
distal supraspinatus tendon. No full-thickness rotator cuff tear.
2. Mild intra-articular biceps tendinosis.
3. Superior labral degeneration without tear.
4. Mild AC joint arthropathy.
5. Small volume of extracapsular contrast present inferiorly, which
is favored to be injection-related.

## 2020-05-23 NOTE — Progress Notes (Signed)
    Procedures performed today:    Procedure: Real-time Ultrasound Guided gadolinium contrast injection of right glenohumeral joint Device: Samsung HS60  Verbal informed consent obtained.  Time-out conducted.  Noted no overlying erythema, induration, or other signs of local infection.  Skin prepped in a sterile fashion.  Local anesthesia: Topical Ethyl chloride.  With sterile technique and under real time ultrasound guidance: Noted normal-appearing joint, I advanced a 22-gauge spinal needle into the glenohumeral joint, I then injected 1 cc kenalog 40, 2 cc lidocaine, 2 cc bupivacaine, syringe switched and 0.1 mL gadolinium injected, syringe again switched and 10 cc sterile saline used to distend the joint. Joint visualized and capsule seen distending confirming intra-articular placement of contrast material and medication. Completed without difficulty  Advised to call if fevers/chills, erythema, induration, drainage, or persistent bleeding.  Images permanently stored in PACS Impression: Technically successful ultrasound guided gadolinium contrast injection for MR arthrography.  Please see separate MR arthrogram report.   Independent interpretation of notes and tests performed by another provider:   None.  Brief History, Exam, Impression, and Recommendations:    Chronic right shoulder pain MaleThis pleasant 45 year old returns, she continues to have right shoulder pain in spite of a subacromial injection at previous visit, at this juncture I was concerned about a labral tear, today we did the injection for MR arthrography. Autoimmune work-up has been negative thus far suggesting more of a degenerative process.    ___________________________________________ [name]. Benjamin Stain, M.D., ABFM., CAQSM. Primary Care and Sports Medicine [facility]  Adjunct Instructor of Family Medicine  [employer]

## 2020-05-23 NOTE — Assessment & Plan Note (Signed)
MaleThis pleasant 45 year old returns, she continues to have right shoulder pain in spite of a subacromial injection at previous visit, at this juncture I was concerned about a labral tear, today we did the injection for MR arthrography. Autoimmune work-up has been negative thus far suggesting more of a degenerative process.

## 2020-05-24 DIAGNOSIS — R69 Illness, unspecified: Secondary | ICD-10-CM | POA: Diagnosis not present

## 2020-05-26 ENCOUNTER — Other Ambulatory Visit: Payer: Self-pay | Admitting: Sports Medicine

## 2020-05-26 MED ORDER — TRAMADOL HCL 50 MG PO TABS: 50.0000 mg | ORAL_TABLET | Freq: Three times a day (TID) | ORAL | 0 refills | Status: DC | PRN

## 2020-05-31 ENCOUNTER — Telehealth: Payer: Self-pay

## 2020-05-31 NOTE — Telephone Encounter (Signed)
Scheduled 7/18 at 2. She is going for her transition soon she can call to reschedule or cancel if she needs to

## 2020-05-31 NOTE — Telephone Encounter (Signed)
-----   Message from Lamona Curl, [place] sent at 01/21/2020 11:42 AM EST ----- Regarding: 6 month follow up Patient needs 6 month follow up with Dr Barron Alvine in July 2022.

## 2020-06-06 ENCOUNTER — Other Ambulatory Visit: Payer: Self-pay | Admitting: Osteopathic Medicine

## 2020-06-06 ENCOUNTER — Other Ambulatory Visit: Payer: Self-pay | Admitting: Physician Assistant

## 2020-06-06 DIAGNOSIS — J0100 Acute maxillary sinusitis, unspecified: Secondary | ICD-10-CM

## 2020-06-07 ENCOUNTER — Other Ambulatory Visit: Payer: Self-pay | Admitting: Osteopathic Medicine

## 2020-06-09 DIAGNOSIS — R69 Illness, unspecified: Secondary | ICD-10-CM | POA: Diagnosis not present

## 2020-06-11 ENCOUNTER — Other Ambulatory Visit: Payer: Self-pay | Admitting: Osteopathic Medicine

## 2020-06-13 DIAGNOSIS — R69 Illness, unspecified: Secondary | ICD-10-CM | POA: Diagnosis not present

## 2020-06-20 ENCOUNTER — Encounter: Payer: Self-pay | Admitting: Osteopathic Medicine

## 2020-06-20 DIAGNOSIS — R69 Illness, unspecified: Secondary | ICD-10-CM | POA: Diagnosis not present

## 2020-06-20 DIAGNOSIS — M25511 Pain in right shoulder: Secondary | ICD-10-CM

## 2020-06-20 DIAGNOSIS — G8929 Other chronic pain: Secondary | ICD-10-CM

## 2020-06-21 MED ORDER — BUPROPION HCL ER (XL) 450 MG PO TB24: 450.0000 mg | ORAL_TABLET | Freq: Every day | ORAL | 0 refills | Status: DC

## 2020-06-27 DIAGNOSIS — R69 Illness, unspecified: Secondary | ICD-10-CM | POA: Diagnosis not present

## 2020-07-05 ENCOUNTER — Telehealth: Payer: Self-pay | Admitting: Osteopathic Medicine

## 2020-07-05 DIAGNOSIS — R69 Illness, unspecified: Secondary | ICD-10-CM | POA: Diagnosis not present

## 2020-07-05 NOTE — Chronic Care Management (AMB) (Signed)
  Chronic Care Management   Note  07/05/2020 Name: Gary Sullivan MRN: [id number] DOB: May 10, 1975  Gary Sullivan is a 45 y.o. year old adult who is a primary care patient of [name], DO. I reached out to [name] by phone today in response to a referral sent by [name]'s PCP, [name], DO.   [name] was given information about Chronic Care Management services today including:  CCM service includes personalized support from designated clinical staff supervised by her physician, including individualized plan of care and coordination with other care providers 24/7 contact phone numbers for assistance for urgent and routine care needs. Service will only be billed when office clinical staff spend 20 minutes or more in a month to coordinate care. Only one practitioner may furnish and bill the service in a calendar month. The patient may stop CCM services at any time (effective at the end of the month) by phone call to the office staff.   Patient agreed to services and verbal consent obtained.   Follow up plan:   [name] [employer] Scheduler

## 2020-07-08 ENCOUNTER — Other Ambulatory Visit: Payer: Self-pay

## 2020-07-08 ENCOUNTER — Ambulatory Visit (INDEPENDENT_AMBULATORY_CARE_PROVIDER_SITE_OTHER): Payer: Medicare HMO | Admitting: Osteopathic Medicine

## 2020-07-08 ENCOUNTER — Encounter: Payer: Self-pay | Admitting: Osteopathic Medicine

## 2020-07-08 VITALS — BP 117/80 | HR 101 | Ht 69.0 in | Wt 279.0 lb

## 2020-07-08 DIAGNOSIS — E7800 Pure hypercholesterolemia, unspecified: Secondary | ICD-10-CM

## 2020-07-08 DIAGNOSIS — F418 Other specified anxiety disorders: Secondary | ICD-10-CM

## 2020-07-08 DIAGNOSIS — Z87898 Personal history of other specified conditions: Secondary | ICD-10-CM | POA: Diagnosis not present

## 2020-07-08 DIAGNOSIS — K580 Irritable bowel syndrome with diarrhea: Secondary | ICD-10-CM

## 2020-07-08 DIAGNOSIS — M797 Fibromyalgia: Secondary | ICD-10-CM

## 2020-07-08 DIAGNOSIS — F649 Gender identity disorder, unspecified: Secondary | ICD-10-CM

## 2020-07-08 DIAGNOSIS — G4733 Obstructive sleep apnea (adult) (pediatric): Secondary | ICD-10-CM | POA: Diagnosis not present

## 2020-07-08 DIAGNOSIS — Z789 Other specified health status: Secondary | ICD-10-CM

## 2020-07-08 DIAGNOSIS — R69 Illness, unspecified: Secondary | ICD-10-CM | POA: Diagnosis not present

## 2020-07-08 NOTE — Progress Notes (Signed)
Gary Sullivan is a 45 y.o. adult transwoman who presents to  [facility]  today, 07/08/20, seeking care for the following:  Hormone therapy for gender affirming care - needs letter for surgeon, needs letter for name change.  Otherwise doing really well!  Working on getting in w/ psychiatry re: ADHD and other diagnoses, but reports stable on current meds      ASSESSMENT & PLAN with other pertinent findings:  The primary encounter diagnosis was Transgender. Diagnoses of History of prediabetes, OSA (obstructive sleep apnea), Depression with anxiety, Gender dysphoria, Irritable bowel syndrome with diarrhea, Fibromyalgia, and High cholesterol were also pertinent to this visit.    Patient Instructions  Ask pharmacy about old prescription reports/ records  I can generate letters for surgeon and for name change  Labs next week   Orders Placed This Encounter  Procedures   CBC   COMPLETE METABOLIC PANEL WITH GFR   Lipid panel   Testosterone   Estradiol   Hemoglobin A1c    No orders of the defined types were placed in this encounter.    See below for relevant physical exam findings  See below for recent lab and imaging results reviewed  Medications, allergies, PMH, PSH, SocH, FamH reviewed below    Follow-up instructions: Return for labs next week, then follow up in 6 months (unles syou've moved by then!) .                                        Exam: BP 117/80   Pulse (!) 101   Ht _0  (1.753 m)   Wt 279 lb (126.6 kg)   SpO2 96%   BMI 41.20 kg/m  Constitutional: VS see above. General Appearance: alert, well-developed, well-nourished, NAD Neck: No masses, trachea midline.  Respiratory: Normal respiratory effort. no wheeze, no rhonchi, no rales Cardiovascular: S1/S2 normal, no murmur, no rub/gallop auscultated. RRR.  Musculoskeletal: Gait normal. Symmetric and  independent movement of all extremities Neurological: Normal balance/coordination. No tremor. Skin: warm, dry, intact.  Psychiatric: Normal judgment/insight. Normal mood and affect. Oriented x3.   Current Meds  Medication Sig   amitriptyline (ELAVIL) 25 MG tablet Take 25 mg by mouth at bedtime.   atenolol (TENORMIN) 50 MG tablet TAKE 1 TABLET(50 MG) BY MOUTH DAILY   bicalutamide (CASODEX) 50 MG tablet Take 50 mg by mouth daily.   buPROPion HCl ER, XL, 450 MG TB24 Take 450 mg by mouth daily.   Cholecalciferol (VITAMIN D3 PO) Take 50,000 Units by mouth once a week.   cholestyramine (QUESTRAN) 4 g packet Take 1 packet (4 g total) by mouth daily.   cyclobenzaprine (FLEXERIL) 10 MG tablet Take 1 tablet (10 mg total) by mouth 3 (three) times daily as needed for muscle spasms. MILD/MODERATE SPASM   DEPO-ESTRADIOL 5 MG/ML injection INJECT 0.8MLS UNDER THE SKIN ONCE WEEKLY   dexlansoprazole (DEXILANT) 60 MG capsule TAKE 1 CAPSULE(60 MG) BY MOUTH DAILY   DIGESTIVE ENZYMES PO Take 1 Dose by mouth daily. Digestive enzymes/probiotic   diphenhydrAMINE (BENADRYL) 25 mg capsule Take 1 capsule (25 mg total) by mouth every 6 (six) hours as needed (headache).   DULoxetine (CYMBALTA) 60 MG capsule Take 60 mg by mouth daily.   fenofibrate 160 MG tablet TAKE 1 TABLET(160 MG) BY MOUTH DAILY   fluticasone (FLONASE) 50 MCG/ACT nasal spray SHAKE LIQUID AND USE  2 SPRAYS IN EACH NOSTRIL DAILY   glycopyrrolate (ROBINUL) 1 MG tablet Take 2 tablets (2 mg total) by mouth 2 (two) times daily.   ibuprofen (ADVIL) 800 MG tablet TAKE 1 TABLET(800 MG) BY MOUTH THREE TIMES DAILY   Insulin Pen Needle (B-D ULTRAFINE III SHORT PEN) 31G X 8 MM MISC USE AS DIRECTED   ketorolac (TORADOL) 10 MG tablet Take 1 tablet (10 mg total) by mouth every 6 (six) hours as needed (headache).   lisinopril (ZESTRIL) 5 MG tablet TAKE 1 TABLET(5 MG) BY MOUTH DAILY   metoCLOPramide (REGLAN) 10 MG tablet Take 1 tablet (10 mg total) by mouth every 6  (six) hours as needed (headache).   naproxen (NAPROSYN) 500 MG tablet TAKE 1 TABLET( 500 MG TOTAL) BY MOUTH TWICE DAILY WITH A MEAL. TAKE EVERY DAY FOR 1 WEEK, THEN USE AS NEEDED AFTER THAT   NEEDLE, DISP, 18 G (B-D HYPODERMIC NEEDLE 18GX1.5") 18G X 1-1/2" MISC Use 1 Needle free injection every 7 (seven) days To draw up estrogen   Omega-3 1000 MG CAPS Take by mouth.   primidone (MYSOLINE) 50 MG tablet TAKE 1 TABLET(50 MG) BY MOUTH TWICE DAILY   Semaglutide,0.25 or 0.5MG/DOS, 2 MG/1.5ML SOPN Inject 0.5 mg into the skin once a week.   spironolactone (ALDACTONE) 50 MG tablet TAKE 1 TABLET(50 MG) BY MOUTH TWICE DAILY   SUMAtriptan (IMITREX) 50 MG tablet Take 1 tablet (50 mg total) by mouth every 2 (two) hours as needed for migraine. May repeat in 2 hours if headache persists or recurs. Max 150 mg per 24 hours.   topiramate (TOPAMAX) 50 MG tablet TAKE 1 TO 2 TABLETS BY MOUTH EVERY NIGHT AT BEDTIME. Future refill requests should go to PCP   traMADol (ULTRAM) 50 MG tablet Take 1-2 tablets (50-100 mg total) by mouth every 8 (eight) hours as needed for moderate pain. Maximum 6 tabs per day.   Current Facility-Administered Medications for the 07/08/20 encounter (Office Visit) with Emeterio Reeve, DO  Medication   0.9 %  sodium chloride infusion    Allergies  Allergen Reactions   Molds & Smuts Cough, Hives, Itching and Shortness Of Breath   Dog Epithelium Allergy Skin Test Itching and Other (See Comments)    Other reaction(s): Other (See Comments)    Sulfamethoxazole-Trimethoprim Other (See Comments), Diarrhea, Nausea Only and Rash    Other reaction(s): Abdominal Pain    Grass Pollen(K-O-R-T-Swt Vern)    Sulfa Antibiotics     Pain, upset stomach, vomiting   Cat Hair Extract Hives, Itching and Rash    Patient Active Problem List   Diagnosis Date Noted   Polyarthralgia 05/10/2020   Chronic right shoulder pain 03/29/2020   Broken or cracked tooth, nontraumatic 02/09/2020   Poor dentition  02/09/2020   Tooth pain 02/09/2020   History of prediabetes 06/04/2019   Transgender 04/21/2019   Carpal tunnel syndrome on right 10/07/2018   OSA (obstructive sleep apnea) 06/19/2018   Excessive daytime sleepiness 06/19/2018   Numbness 05/28/2018   Tremor 05/28/2018   Fibromyalgia 05/28/2018   Depression with anxiety 05/28/2018   High cholesterol 01/02/2012   High blood pressure 01/02/2008   IBS (irritable bowel syndrome) 01/01/2006   GERD (gastroesophageal reflux disease) 01/01/2005   Social anxiety disorder 01/02/1988   Gender dysphoria 01/01/1985   Asthma 01/01/1982    Family History  Problem Relation Age of Onset   High blood pressure Mother    High blood pressure Father    Irritable bowel syndrome Father  High blood pressure Brother    Pancreatic cancer Maternal Grandfather    Colon cancer Maternal Grandfather    Lung cancer Paternal Grandmother    Diabetes Paternal Grandfather    Colon cancer Maternal Aunt    Irritable bowel syndrome Sister    Irritable bowel syndrome Sister    Esophageal cancer Neg Hx     Social History   Tobacco Use  Smoking Status Former   Pack years: 0.00   Types: Cigarettes  Smokeless Tobacco Never    Past Surgical History:  Procedure Laterality Date   CARPAL TUNNEL RELEASE Right 10/08/2018   Procedure: RIGHT CARPAL TUNNEL RELEASE;  Surgeon: Leandrew Koyanagi, MD;  Location: [facility];  Service: Orthopedics;  Laterality: Right;   CHOLECYSTECTOMY     COLONOSCOPY     Around 2014. [place]     Aound 2014 [place]   labrum repair     NASAL SEPTUM SURGERY     SHOULDER ARTHROSCOPY WITH SUBACROMIAL DECOMPRESSION Left    TONSILLECTOMY      Immunization History  Administered Date(s) Administered   Moderna Sars-Covid-2 Vaccination 08/11/2019, 09/05/2019    Recent Results (from the past 2160 hour(s))  CK     Status: None   Collection Time:  05/10/20 11:29 AM  Result Value Ref Range   Total CK 68 44 - 196 U/L  Uric acid     Status: None   Collection Time: 05/10/20 11:29 AM  Result Value Ref Range   Uric Acid, Serum 4.7 4.0 - 8.0 mg/dL    Comment: Therapeutic target for gout patients: <6.0 mg/dL .   Sedimentation rate     Status: None   Collection Time: 05/10/20 11:29 AM  Result Value Ref Range   Sed Rate 9 0 - 15 mm/h  LUPUS(12) PANEL     Status: None   Collection Time: 05/10/20 11:29 AM  Result Value Ref Range   Anti Nuclear Antibody (ANA) NEGATIVE NEGATIVE    Comment: ANA IFA is a first line screen for detecting the presence of up to approximately 150 autoantibodies in various autoimmune diseases. A negative ANA IFA result suggests an ANA-associated autoimmune disease is not present at this time, but is not definitive. If there is high clinical suspicion for Sjogren's syndrome, testing for anti-SS-A/Ro antibody should be considered. Anti-Jo-1 antibody should be considered for clinically suspected inflammatory myopathies. . AC-0: Negative . International Consensus on ANA Patterns (https://www.hernandez-brewer.com/) . For additional information, please refer to http://education.[facility].com/faq/FAQ177 (This link is being provided for informational/ educational purposes only.) .    C3 Complement 138 82 - 185 mg/dL   C4 Complement 31 15 - 53 mg/dL   ds DNA Ab <1 IU/mL    Comment:                            IU/mL       Interpretation                            < or = 4    Negative                            5-9         Indeterminate                            >  or = 10   Positive .    Ribosomal P Protein Ab <1.0 NEG <1.0 NEG AI   ENA SM Ab Ser-aCnc <1.0 NEG <1.0 NEG AI   SM/RNP <1.0 NEG <1.0 NEG AI   SSA (Ro) (ENA) Antibody, IgG <1.0 NEG <1.0 NEG AI   SSB (La) (ENA) Antibody, IgG <1.0 NEG <1.0 NEG AI   Thyroperoxidase Ab SerPl-aCnc 1 <9 IU/mL   Scleroderma (Scl-70) (ENA) Antibody, IgG <1.0  NEG <1.0 NEG AI   Rhuematoid fact SerPl-aCnc <14 <14 IU/mL  HLA-B27 antigen     Status: None   Collection Time: 05/10/20 11:29 AM  Result Value Ref Range   HLA-B27 Antigen NEGATIVE NEGATIVE  Rheumatoid Factor (IgA, IgG, IgM)     Status: None   Collection Time: 05/10/20 11:29 AM  Result Value Ref Range   Rheumatoid Factor (IgG) <5 <=6 U   Rheumatoid Factor (IgA) <5 <=6 U   Rheumatoid Factor (IgM) <5 <=6 U  Cyclic citrul peptide antibody, IgG     Status: None   Collection Time: 05/10/20 11:29 AM  Result Value Ref Range   Cyclic Citrullin Peptide Ab <16 UNITS    Comment: Reference Range Negative:            <20 Weak Positive:       20-39 Moderate Positive:   40-59 Strong Positive:     >59 .   CBC     Status: Abnormal   Collection Time: 07/11/20  8:30 AM  Result Value Ref Range   WBC 9.1 3.8 - 10.8 Thousand/uL   RBC 4.49 4.20 - 5.80 Million/uL   Hemoglobin 13.7 13.2 - 17.1 g/dL   HCT 41.0 38.5 - 50.0 %   MCV 91.3 80.0 - 100.0 fL   MCH 30.5 27.0 - 33.0 pg   MCHC 33.4 32.0 - 36.0 g/dL   RDW 12.2 11.0 - 15.0 %   Platelets 405 (H) 140 - 400 Thousand/uL   MPV 9.1 7.5 - 12.5 fL  Testosterone     Status: Abnormal   Collection Time: 07/11/20  8:30 AM  Result Value Ref Range   Testosterone 20 (L) 250 - 827 ng/dL    Comment: In hypogonadal males, Testosterone, Total, LC/MS/MS, is the recommended assay due to the diminished accuracy of immunoassay at levels below 250 ng/dL. This test code [phone]) must be collected in a red-top tube with no gel.    Estradiol     Status: Abnormal   Collection Time: 07/11/20  8:30 AM  Result Value Ref Range   Estradiol 378 (H) < OR = 39 pg/mL    Comment: Reference range established on post-pubertal patient population. No pre-pubertal reference range established using this assay. For any patients for whom low Estradiol levels are anticipated (e.g. males, pre-pubertal children and hypogonadal/post-menopausal  females), the [employer] Estradiol, Ultrasensitive, LCMSMS assay is recommended (order code [phone]). . Please note: patients being treated with the drug  fulvestrant (Faslodex(R)) have demonstrated significant  interference in immunoassay methods for estradiol  measurement. The cross reactivity could lead to falsely  elevated estradiol test results leading to an  inappropriate clinical assessment of estrogen status. [facility] order code 30289-Estradiol,  Ultrasensitive LC/MS/MS demonstrates negligible cross  reactivity with fulvestrant.     No results found.     All questions at time of visit were answered - patient instructed to contact office with any additional concerns or updates. ER/RTC precautions were reviewed with the patient as applicable.  Please note: manual typing as well as voice recognition software may have been used to produce this document - typos may escape review. Please contact Dr. Sheppard Coil for any needed clarifications.

## 2020-07-08 NOTE — Patient Instructions (Addendum)
Ask pharmacy about old prescription reports/ records  I can generate letters for surgeon and for name change  Labs next week

## 2020-07-11 DIAGNOSIS — Z87898 Personal history of other specified conditions: Secondary | ICD-10-CM | POA: Diagnosis not present

## 2020-07-11 DIAGNOSIS — R7303 Prediabetes: Secondary | ICD-10-CM | POA: Diagnosis not present

## 2020-07-11 DIAGNOSIS — G4733 Obstructive sleep apnea (adult) (pediatric): Secondary | ICD-10-CM | POA: Diagnosis not present

## 2020-07-11 DIAGNOSIS — K580 Irritable bowel syndrome with diarrhea: Secondary | ICD-10-CM | POA: Diagnosis not present

## 2020-07-11 DIAGNOSIS — M797 Fibromyalgia: Secondary | ICD-10-CM | POA: Diagnosis not present

## 2020-07-11 DIAGNOSIS — Z789 Other specified health status: Secondary | ICD-10-CM | POA: Diagnosis not present

## 2020-07-11 DIAGNOSIS — R69 Illness, unspecified: Secondary | ICD-10-CM | POA: Diagnosis not present

## 2020-07-11 DIAGNOSIS — E7800 Pure hypercholesterolemia, unspecified: Secondary | ICD-10-CM | POA: Diagnosis not present

## 2020-07-12 DIAGNOSIS — R69 Illness, unspecified: Secondary | ICD-10-CM | POA: Diagnosis not present

## 2020-07-12 DIAGNOSIS — M25511 Pain in right shoulder: Secondary | ICD-10-CM | POA: Diagnosis not present

## 2020-07-12 LAB — COMPLETE METABOLIC PANEL WITH GFR
AG Ratio: 1.6 (calc) (ref 1.0–2.5)
ALT: 12 U/L (ref 9–46)
AST: 14 U/L (ref 10–40)
Albumin: 4.2 g/dL (ref 3.6–5.1)
Alkaline phosphatase (APISO): 55 U/L (ref 36–130)
BUN: 14 mg/dL (ref 7–25)
CO2: 27 mmol/L (ref 20–32)
Calcium: 8.9 mg/dL (ref 8.6–10.3)
Chloride: 102 mmol/L (ref 98–110)
Creat: 1.21 mg/dL (ref 0.60–1.29)
Globulin: 2.6 g/dL (calc) (ref 1.9–3.7)
Glucose, Bld: 103 mg/dL — ABNORMAL HIGH (ref 65–99)
Potassium: 4.2 mmol/L (ref 3.5–5.3)
Sodium: 138 mmol/L (ref 135–146)
Total Bilirubin: 0.4 mg/dL (ref 0.2–1.2)
Total Protein: 6.8 g/dL (ref 6.1–8.1)
eGFR: 76 mL/min/{1.73_m2} (ref >=60)

## 2020-07-12 LAB — CBC
HCT: 41 % (ref 38.5–50.0)
Hemoglobin: 13.7 g/dL (ref 13.2–17.1)
MCH: 30.5 pg (ref 27.0–33.0)
MCHC: 33.4 g/dL (ref 32.0–36.0)
MCV: 91.3 fL (ref 80.0–100.0)
MPV: 9.1 fL (ref 7.5–12.5)
Platelets: 405 10*3/uL — ABNORMAL HIGH (ref 140–400)
RBC: 4.49 10*6/uL (ref 4.20–5.80)
RDW: 12.2 % (ref 11.0–15.0)
WBC: 9.1 10*3/uL (ref 3.8–10.8)

## 2020-07-12 LAB — HEMOGLOBIN A1C
Hgb A1c MFr Bld: 5.2 % of total Hgb (ref <5.7)
Mean Plasma Glucose: 103 mg/dL
eAG (mmol/L): 5.7 mmol/L

## 2020-07-12 LAB — LIPID PANEL
Cholesterol: 197 mg/dL (ref <200)
HDL: 40 mg/dL (ref >=40)
LDL Cholesterol (Calc): 130 mg/dL (calc) — ABNORMAL HIGH
Non-HDL Cholesterol (Calc): 157 mg/dL (calc) — ABNORMAL HIGH (ref <130)
Total CHOL/HDL Ratio: 4.9 (calc) (ref <5.0)
Triglycerides: 155 mg/dL — ABNORMAL HIGH (ref <150)

## 2020-07-12 LAB — ESTRADIOL: Estradiol: 378 pg/mL — ABNORMAL HIGH (ref <=39)

## 2020-07-12 LAB — TESTOSTERONE: Testosterone: 20 ng/dL — ABNORMAL LOW (ref 250–827)

## 2020-07-14 ENCOUNTER — Other Ambulatory Visit: Payer: Medicare HMO

## 2020-07-14 ENCOUNTER — Other Ambulatory Visit: Payer: Self-pay | Admitting: Osteopathic Medicine

## 2020-07-15 MED ORDER — DEPO-ESTRADIOL 5 MG/ML IM OIL: TOPICAL_OIL | INTRAMUSCULAR | 0 refills | Status: DC

## 2020-07-18 ENCOUNTER — Ambulatory Visit: Payer: Medicare HMO | Admitting: Gastroenterology

## 2020-07-18 DIAGNOSIS — Z1239 Encounter for other screening for malignant neoplasm of breast: Secondary | ICD-10-CM | POA: Diagnosis not present

## 2020-07-18 DIAGNOSIS — R69 Illness, unspecified: Secondary | ICD-10-CM | POA: Diagnosis not present

## 2020-07-18 DIAGNOSIS — Z1231 Encounter for screening mammogram for malignant neoplasm of breast: Secondary | ICD-10-CM | POA: Diagnosis not present

## 2020-07-19 DIAGNOSIS — R69 Illness, unspecified: Secondary | ICD-10-CM | POA: Diagnosis not present

## 2020-07-20 ENCOUNTER — Encounter: Payer: Self-pay | Admitting: Osteopathic Medicine

## 2020-07-20 MED ORDER — DEPO-ESTRADIOL 5 MG/ML IM OIL: TOPICAL_OIL | INTRAMUSCULAR | 0 refills | Status: DC

## 2020-07-20 NOTE — Telephone Encounter (Signed)
Rx renewed

## 2020-07-22 ENCOUNTER — Telehealth: Payer: Self-pay

## 2020-07-22 ENCOUNTER — Other Ambulatory Visit: Payer: Self-pay

## 2020-07-22 MED ORDER — CHOLESTYRAMINE 4 G PO PACK: 4.0000 g | PACK | Freq: Every day | ORAL | 3 refills | Status: DC

## 2020-07-22 NOTE — Telephone Encounter (Signed)
Received refill request for Cholestyramine from [facility]. After verifying with the patient, that medication was transferred to [facility] from [facility]. Patient stated that he was having issues with [facility] to refill his med.

## 2020-07-25 ENCOUNTER — Other Ambulatory Visit: Payer: Self-pay | Admitting: Neurology

## 2020-07-25 ENCOUNTER — Encounter: Payer: Self-pay | Admitting: Neurology

## 2020-07-25 MED ORDER — TOPIRAMATE 50 MG PO TABS: ORAL_TABLET | ORAL | 0 refills | Status: DC

## 2020-07-25 MED ORDER — CYCLOBENZAPRINE HCL 10 MG PO TABS: 10.0000 mg | ORAL_TABLET | Freq: Three times a day (TID) | ORAL | 0 refills | Status: DC | PRN

## 2020-07-26 ENCOUNTER — Other Ambulatory Visit: Payer: Self-pay

## 2020-07-26 DIAGNOSIS — R69 Illness, unspecified: Secondary | ICD-10-CM | POA: Diagnosis not present

## 2020-07-26 MED ORDER — BUPROPION HCL ER (XL) 450 MG PO TB24: 450.0000 mg | ORAL_TABLET | Freq: Every day | ORAL | 0 refills | Status: DC

## 2020-07-26 MED ORDER — ATENOLOL 50 MG PO TABS: ORAL_TABLET | ORAL | 1 refills | Status: DC

## 2020-07-26 MED ORDER — PRIMIDONE 50 MG PO TABS: ORAL_TABLET | ORAL | 1 refills | Status: DC

## 2020-07-26 MED ORDER — CHOLESTYRAMINE 4 G PO PACK: 4.0000 g | PACK | Freq: Every day | ORAL | 3 refills | Status: DC

## 2020-07-26 MED ORDER — DEXLANSOPRAZOLE 60 MG PO CPDR: DELAYED_RELEASE_CAPSULE | ORAL | 1 refills | Status: DC

## 2020-07-27 ENCOUNTER — Other Ambulatory Visit: Payer: Self-pay

## 2020-07-27 ENCOUNTER — Telehealth: Payer: Self-pay

## 2020-07-27 DIAGNOSIS — J0100 Acute maxillary sinusitis, unspecified: Secondary | ICD-10-CM

## 2020-07-27 MED ORDER — GLYCOPYRROLATE 1 MG PO TABS: 2.0000 mg | ORAL_TABLET | Freq: Two times a day (BID) | ORAL | 3 refills | Status: DC

## 2020-07-27 MED ORDER — METOCLOPRAMIDE HCL 10 MG PO TABS: 10.0000 mg | ORAL_TABLET | Freq: Four times a day (QID) | ORAL | 1 refills | Status: DC | PRN

## 2020-07-27 MED ORDER — FLUTICASONE PROPIONATE 50 MCG/ACT NA SUSP: NASAL | 1 refills | Status: DC

## 2020-07-27 NOTE — Telephone Encounter (Signed)
[facility] pharmacy is requesting med refill for amitriptyline. Written by historical provider.

## 2020-07-29 ENCOUNTER — Encounter: Payer: Self-pay | Admitting: Osteopathic Medicine

## 2020-07-29 MED ORDER — AMITRIPTYLINE HCL 25 MG PO TABS: 25.0000 mg | ORAL_TABLET | Freq: Every day | ORAL | 3 refills | Status: DC

## 2020-07-29 NOTE — Addendum Note (Signed)
Addended by: Deirdre Pippins on: 07/29/2020 02:55 PM   Modules accepted: Orders

## 2020-08-02 DIAGNOSIS — R69 Illness, unspecified: Secondary | ICD-10-CM | POA: Diagnosis not present

## 2020-08-04 ENCOUNTER — Telehealth: Payer: Self-pay | Admitting: Pharmacist

## 2020-08-04 ENCOUNTER — Telehealth: Payer: Self-pay

## 2020-08-04 ENCOUNTER — Ambulatory Visit: Payer: Medicare HMO | Admitting: Gastroenterology

## 2020-08-04 NOTE — Chronic Care Management (AMB) (Signed)
Chronic Care Management Pharmacy Assistant   Name: Gary Sullivan  MRN: [id number] DOB: November 06, 1975  Gary Sullivan is an 45 y.o. year old adult who presents for his initial CCM visit with the clinical pharmacist.  Reason for Encounter: Initial CCM Visit   Recent office visits:  07/08/20 [name] DO(PCP)- Patient was seen for Transgender(Hormone Therapy). Patient completed course of abx. Labs were orderded for the following week and follow up w/ PCP in 6 months.  06/20/20 Rodney Langton- Patient messaged about Shoulder pain and referral for Ortho was placed.  05/23/20 Rodney Langton- Patient was seen for Chronic right shoulder pain. Patient had procedure to place capsule in joint. Patient started dexlansoprazole  primidone. No follow up noted.  05/10/20 Rodney Langton- Patient was seen for chronic right shoulder pain. Labs were ordered and scans of the right shoulder. Patient started Hydrocodone-Acetaminophen 10-325 MG q8h. Patient advised to f/u for arthrogram injection.    03/29/20 Rodney Langton- Patient seen for Impingement syndrome of the right shoulder. Referral was placed to Physical Therapy. Patient received a subacromial injection. Follow up in 6 weeks.  03/28/20 [name] DO- Patient was seen for chronic shoulder pain. Patient started Naproxen 500 MG BID and Tramadol 50-100 MG q8h or prn. Labs were ordered and patient advised to follow up with Sports Med ASAP.  Recent consult visits:  None noted  Hospital visits:  None in previous 6 months  Medications: Outpatient Encounter Medications as of 08/04/2020  Medication Sig   amitriptyline (ELAVIL) 25 MG tablet Take 1 tablet (25 mg total) by mouth at bedtime.   amitriptyline (ELAVIL) 25 MG tablet Take 1 tablet (25 mg total) by mouth at bedtime.   atenolol (TENORMIN) 50 MG tablet TAKE 1 TABLET(50 MG) BY MOUTH DAILY   bicalutamide (CASODEX) 50 MG tablet Take 50 mg by mouth daily.   buPROPion HCl  ER, XL, 450 MG TB24 Take 450 mg by mouth daily.   Cholecalciferol (VITAMIN D3 PO) Take 50,000 Units by mouth once a week.   cholestyramine (QUESTRAN) 4 g packet Take 1 packet (4 g total) by mouth daily.   cyclobenzaprine (FLEXERIL) 10 MG tablet Take 1 tablet (10 mg total) by mouth 3 (three) times daily as needed for muscle spasms. MILD/MODERATE SPASM   DEPO-ESTRADIOL 5 MG/ML injection INJECT 0.8MLS UNDER THE SKIN ONCE WEEKLY   dexlansoprazole (DEXILANT) 60 MG capsule TAKE 1 CAPSULE(60 MG) BY MOUTH DAILY   DIGESTIVE ENZYMES PO Take 1 Dose by mouth daily. Digestive enzymes/probiotic   diphenhydrAMINE (BENADRYL) 25 mg capsule Take 1 capsule (25 mg total) by mouth every 6 (six) hours as needed (headache).   DULoxetine (CYMBALTA) 60 MG capsule Take 60 mg by mouth daily.   fenofibrate 160 MG tablet TAKE 1 TABLET(160 MG) BY MOUTH DAILY   fluticasone (FLONASE) 50 MCG/ACT nasal spray SHAKE LIQUID AND USE 2 SPRAYS IN EACH NOSTRIL DAILY   glycopyrrolate (ROBINUL) 1 MG tablet Take 2 tablets (2 mg total) by mouth 2 (two) times daily.   ibuprofen (ADVIL) 800 MG tablet TAKE 1 TABLET(800 MG) BY MOUTH THREE TIMES DAILY   Insulin Pen Needle (B-D ULTRAFINE III SHORT PEN) 31G X 8 MM MISC USE AS DIRECTED   ketorolac (TORADOL) 10 MG tablet Take 1 tablet (10 mg total) by mouth every 6 (six) hours as needed (headache).   lisinopril (ZESTRIL) 5 MG tablet TAKE 1 TABLET(5 MG) BY MOUTH DAILY   metoCLOPramide (REGLAN) 10 MG tablet Take 1 tablet (10 mg total) by mouth every 6 (  six) hours as needed (headache).   naproxen (NAPROSYN) 500 MG tablet TAKE 1 TABLET( 500 MG TOTAL) BY MOUTH TWICE DAILY WITH A MEAL. TAKE EVERY DAY FOR 1 WEEK, THEN USE AS NEEDED AFTER THAT   NEEDLE, DISP, 18 G (B-D HYPODERMIC NEEDLE 18GX1.5") 18G X 1-1/2" MISC Use 1 Needle free injection every 7 (seven) days To draw up estrogen   Omega-3 1000 MG CAPS Take by mouth.   primidone (MYSOLINE) 50 MG tablet TAKE 1 TABLET(50 MG) BY MOUTH TWICE DAILY    Semaglutide,0.25 or 0.5MG /DOS, 2 MG/1.5ML SOPN Inject 0.5 mg into the skin once a week.   spironolactone (ALDACTONE) 50 MG tablet TAKE 1 TABLET(50 MG) BY MOUTH TWICE DAILY   SUMAtriptan (IMITREX) 50 MG tablet Take 1 tablet (50 mg total) by mouth every 2 (two) hours as needed for migraine. May repeat in 2 hours if headache persists or recurs. Max 150 mg per 24 hours.   topiramate (TOPAMAX) 50 MG tablet TAKE 1 TO 2 TABLETS BY MOUTH EVERY NIGHT AT BEDTIME. (Future refill requests should go to PCP or pt should schedule F/U)   traMADol (ULTRAM) 50 MG tablet Take 1-2 tablets (50-100 mg total) by mouth every 8 (eight) hours as needed for moderate pain. Maximum 6 tabs per day.   Facility-Administered Encounter Medications as of 08/04/2020  Medication   0.9 %  sodium chloride infusion   Current Medication List  amitriptyline (ELAVIL) 25 MG tablet last filled 07/29/20 90 DS atenolol (TENORMIN) 50 MG tablet last filled 07/31/20 30 DS bupropion HCl ER, XL, 450 MG TB24 last filled 08/01/20 30 DS Cholecalciferol (VITAMIN D3 PO)  cholestyramine (QUESTRAN) 4 g packet  cyclobenzaprine (FLEXERIL) 10 MG tablet last filled 08/01/20 30 DS dexlansoprazole (DEXILANT) 60 MG capsule last filled 07/31/20 30 DS DIGESTIVE ENZYMES PO diphenhydramine (BENADRYL) 25 mg capsule  Duloxetine (CYMBALTA) 60 MG capsule fenofibrate 160 MG tablet last filled 07/31/20 30 DS fluticasone (FLONASE) 50 MCG/ACT nasal spray last filled 07/30/20 30 DS glycopyrrolate (ROBINUL) 1 MG tablet last filled 07/31/20 30 DS ibuprofen (ADVIL) 800 MG tablet lisinopril (ZESTRIL) 5 MG tablet last filled 06/07/20 90 DS metoclopramide (REGLAN) 10 MG tablet last filled 07/30/20 7 DS naproxen (NAPROSYN) 500 MG tablet last filled 6/6/2 53 DS Omega-3 1000 MG CAPS primidone (MYSOLINE) 50 MG tablet last filled 08/01/20 30 DS Semaglutide,0.25 or 0.5MG /DOS, 2 MG/1.5ML SOPN last filled 07/30/20 28 DS spironolactone (ALDACTONE) 50 MG tablet last filled 06/14/20 90  DS topiramate (TOPAMAX) 50 MG tablet last filled 08/01/20 30 DS tramadol (ULTRAM) 50 MG tablet last filled 05/26/20 3 DS   Gasper Sells CMA Clinical Pharmacist Assistant [phone]

## 2020-08-04 NOTE — Telephone Encounter (Signed)
Patient left a vm msg stating that they are unable to get the Depo-estradiol rx at any pharmacy. The rx is on back order with no availability date. Patient has been without the medication for weeks and is now having symptoms of withdrawal. Patient is requesting an alternative rx to be sent to the pharmacy.

## 2020-08-04 NOTE — Telephone Encounter (Signed)
Please call and see if she wants patches or pills, and confirm pharmacy!

## 2020-08-09 ENCOUNTER — Telehealth: Payer: Self-pay

## 2020-08-09 DIAGNOSIS — R69 Illness, unspecified: Secondary | ICD-10-CM | POA: Diagnosis not present

## 2020-08-09 MED ORDER — DULOXETINE HCL 60 MG PO CPEP: 60.0000 mg | ORAL_CAPSULE | Freq: Every day | ORAL | 3 refills | Status: DC

## 2020-08-09 NOTE — Telephone Encounter (Signed)
[facility] requesting med refill for duloxetine 60 mg. Rx written by historical provider.

## 2020-08-10 NOTE — Telephone Encounter (Signed)
Task completed. Patient would like to have the estradiol patches. Pls send the rx to [facility] order. Thanks.

## 2020-08-11 ENCOUNTER — Telehealth: Payer: Medicare HMO

## 2020-08-12 ENCOUNTER — Telehealth: Payer: Self-pay

## 2020-08-12 ENCOUNTER — Encounter: Payer: Self-pay | Admitting: Osteopathic Medicine

## 2020-08-12 MED ORDER — ESTRADIOL 0.1 MG/24HR TD PTWK: 0.1000 mg | MEDICATED_PATCH | TRANSDERMAL | 1 refills | Status: DC

## 2020-08-12 NOTE — Telephone Encounter (Signed)
[facility] pharmacy requesting med refill for dicyclomine 20 mg. Rx not listed in active med list.

## 2020-08-16 ENCOUNTER — Encounter: Payer: Self-pay | Admitting: Osteopathic Medicine

## 2020-08-16 ENCOUNTER — Other Ambulatory Visit: Payer: Self-pay

## 2020-08-16 DIAGNOSIS — E669 Obesity, unspecified: Secondary | ICD-10-CM

## 2020-08-16 DIAGNOSIS — Z1231 Encounter for screening mammogram for malignant neoplasm of breast: Secondary | ICD-10-CM

## 2020-08-16 DIAGNOSIS — Z789 Other specified health status: Secondary | ICD-10-CM

## 2020-08-16 DIAGNOSIS — R7303 Prediabetes: Secondary | ICD-10-CM | POA: Insufficient documentation

## 2020-08-16 DIAGNOSIS — F649 Gender identity disorder, unspecified: Secondary | ICD-10-CM

## 2020-08-16 DIAGNOSIS — Z87898 Personal history of other specified conditions: Secondary | ICD-10-CM

## 2020-08-16 DIAGNOSIS — E7800 Pure hypercholesterolemia, unspecified: Secondary | ICD-10-CM

## 2020-08-16 DIAGNOSIS — G4733 Obstructive sleep apnea (adult) (pediatric): Secondary | ICD-10-CM

## 2020-08-16 MED ORDER — DICYCLOMINE HCL 20 MG PO TABS: 20.0000 mg | ORAL_TABLET | Freq: Four times a day (QID) | ORAL | 3 refills | Status: DC | PRN

## 2020-08-16 MED ORDER — ESTRADIOL 0.1 MG/24HR TD PTWK: 0.1000 mg | MEDICATED_PATCH | TRANSDERMAL | 3 refills | Status: DC

## 2020-08-16 MED ORDER — ESTRADIOL CYPIONATE 5 MG/ML IM OIL: 4.0000 mg | TOPICAL_OIL | INTRAMUSCULAR | 0 refills | Status: DC

## 2020-08-16 NOTE — Addendum Note (Signed)
Addended by: Deirdre Pippins on: 08/16/2020 05:37 PM   Modules accepted: Orders

## 2020-08-16 NOTE — Telephone Encounter (Signed)
Please call [facility] to make sure they cancel the estrogen injections then, I made a note in the patches prescription but just to be safe.  Thanks!

## 2020-08-16 NOTE — Addendum Note (Signed)
Addended by: Deirdre Pippins on: 08/16/2020 05:39 PM   Modules accepted: Orders

## 2020-08-17 DIAGNOSIS — R69 Illness, unspecified: Secondary | ICD-10-CM | POA: Diagnosis not present

## 2020-08-17 NOTE — Telephone Encounter (Signed)
Task completed. Attempted to be transferred to [facility]. No answer, on hold for 33 minutes. Informed to send information via fax [phone] to disregard order for injectable estradiol rx. Letter completed and faxed. Confirmation received.

## 2020-08-23 DIAGNOSIS — R69 Illness, unspecified: Secondary | ICD-10-CM | POA: Diagnosis not present

## 2020-08-31 DIAGNOSIS — R69 Illness, unspecified: Secondary | ICD-10-CM | POA: Diagnosis not present

## 2020-09-01 ENCOUNTER — Other Ambulatory Visit: Payer: Self-pay | Admitting: Osteopathic Medicine

## 2020-09-01 DIAGNOSIS — J0100 Acute maxillary sinusitis, unspecified: Secondary | ICD-10-CM

## 2020-09-06 DIAGNOSIS — R69 Illness, unspecified: Secondary | ICD-10-CM | POA: Diagnosis not present

## 2020-09-06 DIAGNOSIS — F4310 Post-traumatic stress disorder, unspecified: Secondary | ICD-10-CM | POA: Diagnosis not present

## 2020-09-13 DIAGNOSIS — F4310 Post-traumatic stress disorder, unspecified: Secondary | ICD-10-CM | POA: Diagnosis not present

## 2020-09-13 DIAGNOSIS — R69 Illness, unspecified: Secondary | ICD-10-CM | POA: Diagnosis not present

## 2020-09-16 ENCOUNTER — Other Ambulatory Visit: Payer: Self-pay | Admitting: Osteopathic Medicine

## 2020-09-20 ENCOUNTER — Encounter (HOSPITAL_BASED_OUTPATIENT_CLINIC_OR_DEPARTMENT_OTHER): Payer: Self-pay

## 2020-09-20 ENCOUNTER — Other Ambulatory Visit: Payer: Self-pay

## 2020-09-20 DIAGNOSIS — F4310 Post-traumatic stress disorder, unspecified: Secondary | ICD-10-CM | POA: Diagnosis not present

## 2020-09-20 DIAGNOSIS — R82998 Other abnormal findings in urine: Secondary | ICD-10-CM | POA: Insufficient documentation

## 2020-09-20 DIAGNOSIS — E119 Type 2 diabetes mellitus without complications: Secondary | ICD-10-CM | POA: Insufficient documentation

## 2020-09-20 DIAGNOSIS — I10 Essential (primary) hypertension: Secondary | ICD-10-CM | POA: Diagnosis not present

## 2020-09-20 DIAGNOSIS — Z87891 Personal history of nicotine dependence: Secondary | ICD-10-CM | POA: Insufficient documentation

## 2020-09-20 DIAGNOSIS — R319 Hematuria, unspecified: Secondary | ICD-10-CM | POA: Diagnosis not present

## 2020-09-20 DIAGNOSIS — J45909 Unspecified asthma, uncomplicated: Secondary | ICD-10-CM | POA: Diagnosis not present

## 2020-09-20 DIAGNOSIS — R300 Dysuria: Secondary | ICD-10-CM | POA: Insufficient documentation

## 2020-09-20 DIAGNOSIS — R310 Gross hematuria: Secondary | ICD-10-CM | POA: Diagnosis not present

## 2020-09-20 DIAGNOSIS — R69 Illness, unspecified: Secondary | ICD-10-CM | POA: Diagnosis not present

## 2020-09-20 NOTE — ED Triage Notes (Signed)
Pt states that their penis started bleeding tonight. No trauma reported. Pt states, "I don't know if it's hematuria, but my front part is bleeding." Pt prefers She/her pronouns and the name Gary Sullivan.

## 2020-09-21 ENCOUNTER — Emergency Department (HOSPITAL_BASED_OUTPATIENT_CLINIC_OR_DEPARTMENT_OTHER)
Admission: EM | Admit: 2020-09-21 | Discharge: 2020-09-21 | Disposition: A | Discharge status: Home / Self Care | Payer: Medicare HMO | Attending: Emergency Medicine | Admitting: Emergency Medicine

## 2020-09-21 DIAGNOSIS — R310 Gross hematuria: Secondary | ICD-10-CM | POA: Diagnosis not present

## 2020-09-21 LAB — URINALYSIS, ROUTINE W REFLEX MICROSCOPIC
Bilirubin Urine: NEGATIVE
Glucose, UA: NEGATIVE mg/dL
Ketones, ur: NEGATIVE mg/dL
Leukocytes,Ua: NEGATIVE
Nitrite: NEGATIVE
Protein, ur: NEGATIVE mg/dL
Specific Gravity, Urine: 1.015 (ref 1.005–1.030)
pH: 6 (ref 5.0–8.0)

## 2020-09-21 LAB — GC/CHLAMYDIA PROBE AMP (~~LOC~~) NOT AT ARMC
Chlamydia: NEGATIVE
Comment: NEGATIVE
Comment: NORMAL
Neisseria Gonorrhea: NEGATIVE

## 2020-09-21 LAB — URINALYSIS, MICROSCOPIC (REFLEX)

## 2020-09-21 MED ORDER — CEPHALEXIN 500 MG PO CAPS: 500.0000 mg | ORAL_CAPSULE | Freq: Two times a day (BID) | ORAL | 0 refills | Status: AC

## 2020-09-21 NOTE — Discharge Instructions (Signed)
You were seen in the emergency room today with blood in the urine.  I am starting on antibiotics as there are some bacteria on your urine sample but I would like for you to call the urology service listed to schedule a follow-up appointment.  Return to the emergency department with any inability to urinate, fevers, back or side pain.

## 2020-09-21 NOTE — ED Provider Notes (Signed)
Emergency Department Provider Note   I have reviewed the triage vital signs and the nursing notes.   HISTORY  Chief Complaint Hematuria   HPI Gary Sullivan is a 45 y.o. adult with pronouns she/her going by "Gary Sullivan" presents to the ED with urethral irritation and gross hematuria. Symptoms began abruptly today.  She noted some mild discomfort with urination and then at the end of urination noted some bright red blood.  There is no urinary retention symptoms.  She tells me she is in a Gary Sullivan distance relationship and not sexually active.  She has not been sexually active in the last 7 years.  No prior history of kidney stone.  She is not having pain in the back/flank area or lower abdomen.  No new medications.  No history of similar pain or bleeding in the past.    Past Medical History:  Diagnosis Date   Allergy    Anxiety    Asthma    Depression    Diabetes mellitus without complication (HCC)    Elevated heart rate with elevated blood pressure and diagnosis of hypertension    Environmental allergies    Fibromyalgia    Fibromyalgia    Gender dysphoria    GERD (gastroesophageal reflux disease)    High cholesterol    History of prediabetes 06/04/2019   Hyperhidrosis    Hypertension    IBS (irritable bowel syndrome)    Sleep apnea    Transgender 04/21/2019    Patient Active Problem List   Diagnosis Date Noted   Pre-diabetes 08/16/2020   Polyarthralgia 05/10/2020   Chronic right shoulder pain 03/29/2020   Broken or cracked tooth, nontraumatic 02/09/2020   Poor dentition 02/09/2020   Tooth pain 02/09/2020   History of prediabetes 06/04/2019   Transgender 04/21/2019   Carpal tunnel syndrome on right 10/07/2018   OSA (obstructive sleep apnea) 06/19/2018   Excessive daytime sleepiness 06/19/2018   Numbness 05/28/2018   Tremor 05/28/2018   Fibromyalgia 05/28/2018   Depression with anxiety 05/28/2018   High cholesterol 01/02/2012   High blood pressure 01/02/2008   IBS  (irritable bowel syndrome) 01/01/2006   GERD (gastroesophageal reflux disease) 01/01/2005   Social anxiety disorder 01/02/1988   Gender dysphoria 01/01/1985   Asthma 01/01/1982    Past Surgical History:  Procedure Laterality Date   CARPAL TUNNEL RELEASE Right 10/08/2018   Procedure: RIGHT CARPAL TUNNEL RELEASE;  Surgeon: Tarry Kos, MD;  Location: [facility];  Service: Orthopedics;  Laterality: Right;   CHOLECYSTECTOMY     COLONOSCOPY     Around 2014. [facility] [place]   ESOPHAGOGASTRODUODENOSCOPY     Aound 2014 [facility] [place], [place]   labrum repair     NASAL SEPTUM SURGERY     SHOULDER ARTHROSCOPY WITH SUBACROMIAL DECOMPRESSION Left    TONSILLECTOMY      Allergies Molds & smuts, Dog epithelium allergy skin test, Sulfamethoxazole-trimethoprim, Grass pollen(k-o-r-t-swt vern), Sulfa antibiotics, and Cat hair extract  Family History  Problem Relation Age of Onset   High blood pressure Mother    High blood pressure Father    Irritable bowel syndrome Father    High blood pressure Brother    Pancreatic cancer Maternal Grandfather    Colon cancer Maternal Grandfather    Lung cancer Paternal Grandmother    Diabetes Paternal Grandfather    Colon cancer Maternal Aunt    Irritable bowel syndrome Sister    Irritable bowel syndrome Sister    Esophageal cancer Neg Hx  Social History Social History   Tobacco Use   Smoking status: Former    Types: Cigarettes   Smokeless tobacco: Never  Vaping Use   Vaping Use: Every day  Substance Use Topics   Alcohol use: Not Currently   Drug use: Not Currently    Review of Systems  Constitutional: No fever/chills Eyes: No visual changes. ENT: No sore throat. Cardiovascular: Denies chest pain. Respiratory: Denies shortness of breath. Gastrointestinal: No abdominal pain.  No nausea, no vomiting.  No diarrhea.  No constipation. Genitourinary: Positive for dysuria and hematuria.   Musculoskeletal: Negative for back pain. Skin: Negative for rash. Neurological: Negative for headaches, focal weakness or numbness.  10-point ROS otherwise negative.  ____________________________________________   PHYSICAL EXAM:  VITAL SIGNS: ED Triage Vitals  Enc Vitals Group     BP 09/20/20 2354 126/83     Pulse Rate 09/20/20 2354 96     Resp 09/20/20 2354 18     Temp 09/20/20 2354 98.5 F (36.9 C)     Temp Source 09/20/20 2354 Oral     SpO2 09/20/20 2354 97 %     Weight 09/20/20 2356 280 lb (127 kg)     Height 09/20/20 2356 5\' 10"  (1.778 m)   Constitutional: Alert and oriented. Well appearing and in no acute distress. Eyes: Conjunctivae are normal.  Head: Atraumatic. Nose: No congestion/rhinnorhea. Mouth/Throat: Mucous membranes are moist. Neck: No stridor.  Cardiovascular: Good peripheral circulation  Respiratory: Normal respiratory effort.  Gastrointestinal: Soft and nontender. No distention.  Genitourinary: Exam performed after obtaining patient's verbal consent and with a nurse tech chaperone.  Normal external genitalia without urethral discharge or bleeding.  No testicular/scrotal tenderness or masses.  Musculoskeletal:  No gross deformities of extremities. Neurologic:  Normal speech and language.  Skin:  Skin is warm, dry and intact. No rash noted.  ____________________________________________   LABS (all labs ordered are listed, but only abnormal results are displayed)  Labs Reviewed  URINALYSIS, ROUTINE W REFLEX MICROSCOPIC - Abnormal; Notable for the following components:      Result Value   Hgb urine dipstick LARGE (*)    All other components within normal limits  URINALYSIS, MICROSCOPIC (REFLEX) - Abnormal; Notable for the following components:   Bacteria, UA RARE (*)    All other components within normal limits  URINE CULTURE  GC/CHLAMYDIA PROBE AMP ([place]) NOT AT [facility]     ____________________________________________   PROCEDURES  Procedure(s) performed:   Procedures  None  ____________________________________________   INITIAL IMPRESSION / ASSESSMENT AND PLAN / ED COURSE  Pertinent labs & imaging results that were available during my care of the patient were reviewed by me and considered in my medical decision making (see chart for details).   Patient presents to the emergency department with dysuria and hematuria starting today.  No flank or back pain to strongly suspect ureteral stone.  No urinary retention symptoms.  Patient is afebrile.  Patient is not currently sexually active.  No throat discharge or bleeding on exam.  Plan to send urine screening for sexually transmitted infection.  UA shows rare bacteria along with hemoglobin and elevated WBCs.  Plan to cover with Keflex but have given contact information for urology.  If hematuria continues may benefit from cystoscopy or other hematuria work-up in the outpatient setting. Discussed this along with ED return precautions.    ____________________________________________  FINAL CLINICAL IMPRESSION(S) / ED DIAGNOSES  Final diagnoses:  Gross hematuria    NEW OUTPATIENT MEDICATIONS STARTED DURING THIS  VISIT:  Discharge Medication List as of 09/21/2020  1:56 AM     START taking these medications   Details  cephALEXin (KEFLEX) 500 MG capsule Take 1 capsule (500 mg total) by mouth 2 (two) times daily for 7 days., Starting Wed 09/21/2020, Until Wed 09/28/2020, Normal        Note:  This document was prepared using Dragon voice recognition software and may include unintentional dictation errors.  Alona Bene, MD, [facility] Emergency Medicine    Orra Nolde, Arlyss Repress, MD 09/21/20 [phone]

## 2020-09-22 ENCOUNTER — Other Ambulatory Visit: Payer: Self-pay

## 2020-09-22 ENCOUNTER — Ambulatory Visit (INDEPENDENT_AMBULATORY_CARE_PROVIDER_SITE_OTHER): Payer: Medicare HMO

## 2020-09-22 DIAGNOSIS — Z1231 Encounter for screening mammogram for malignant neoplasm of breast: Secondary | ICD-10-CM

## 2020-09-22 LAB — URINE CULTURE: Culture: >=100000 — AB

## 2020-09-22 IMAGING — MG MM DIGITAL SCREENING BILAT W/ TOMO AND CAD
8 series · 8 of 24 positions shown · non-contrast
Comparison: None.

CLINICAL DATA: Screening.

EXAM:
DIGITAL SCREENING BILATERAL MAMMOGRAM WITH TOMOSYNTHESIS AND CAD
TECHNIQUE: Bilateral screening digital craniocaudal and mediolateral oblique
mammograms were obtained. Bilateral screening digital breast
tomosynthesis was performed. The images were evaluated with
computer-aided detection.

[R CC synth-2D]
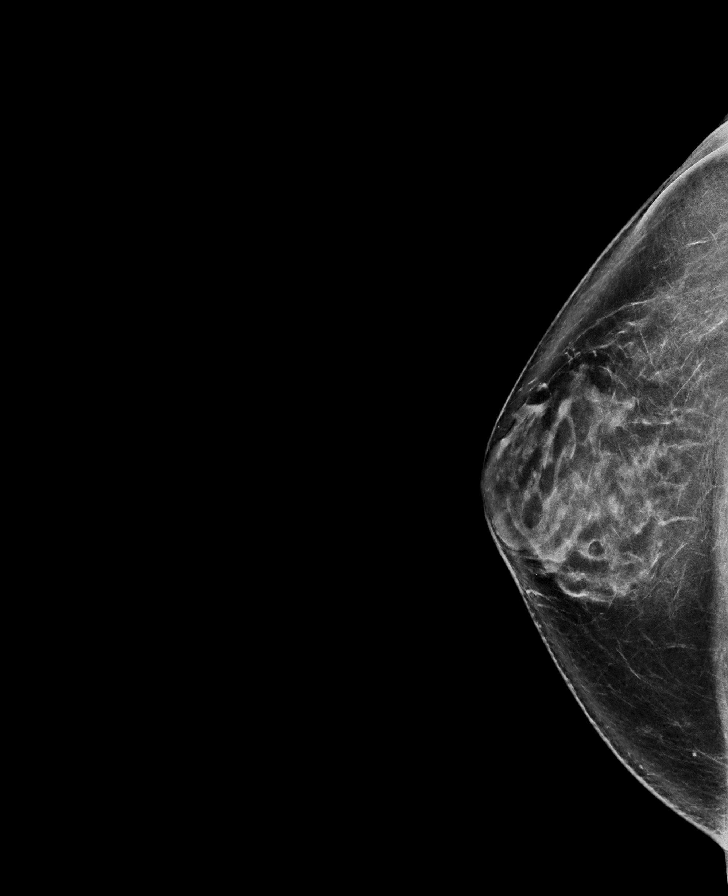

[R MLO synth-2D]
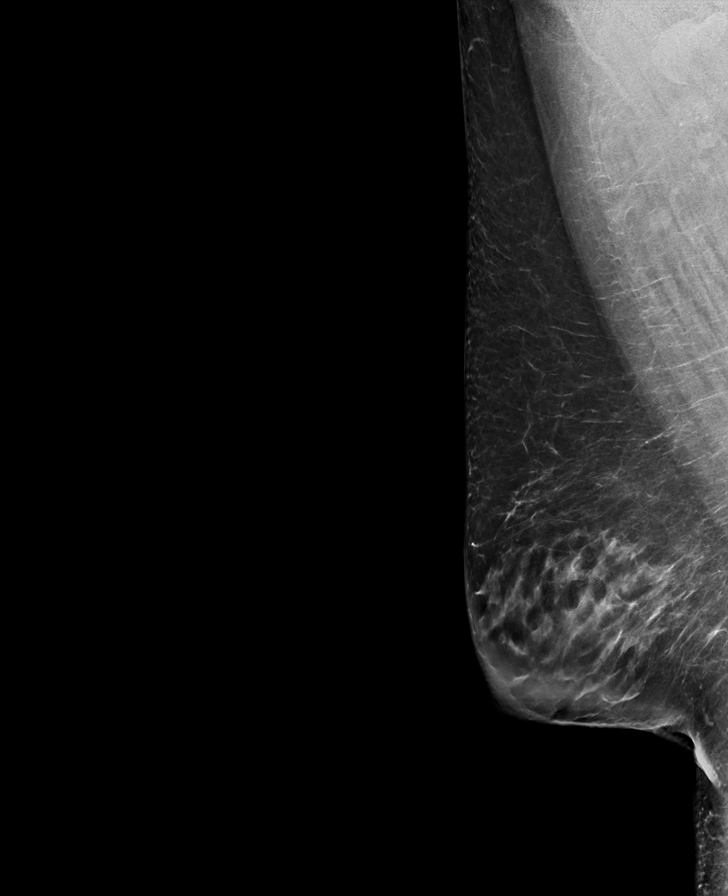

[L CC synth-2D]
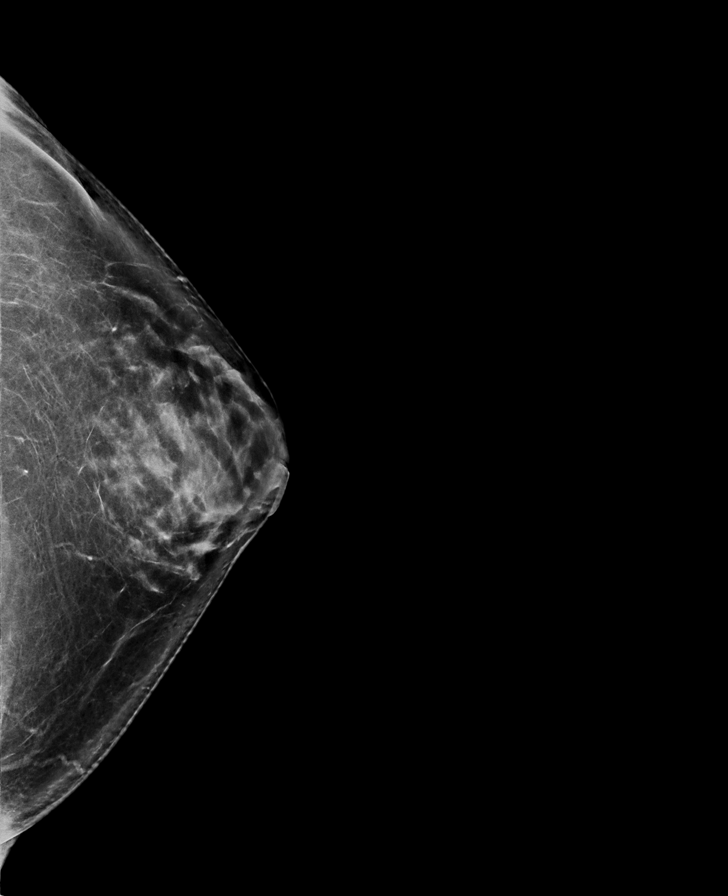

[L MLO synth-2D]
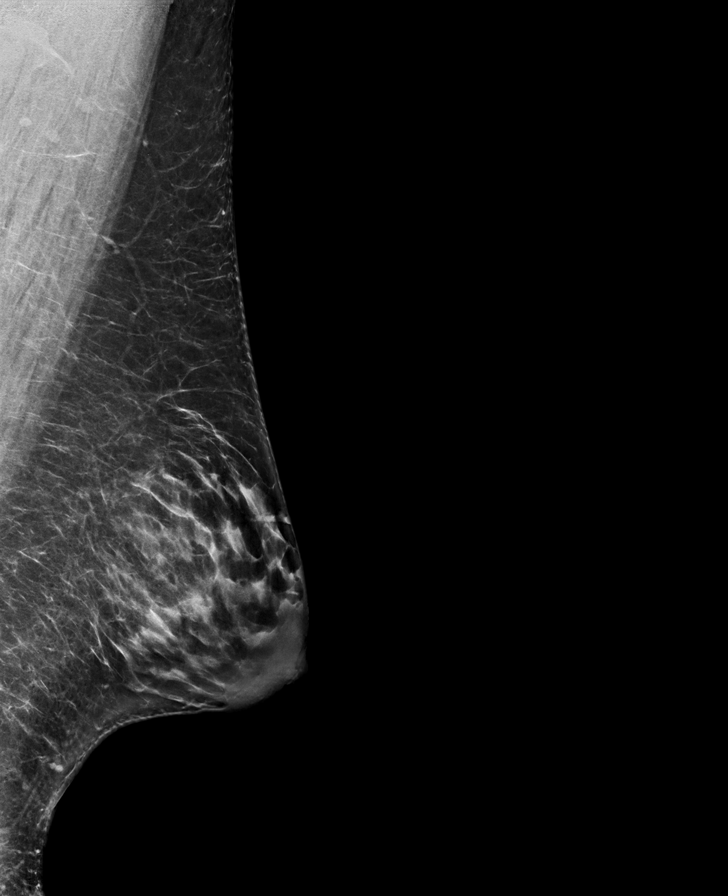

[L MLO tomo · tomo slice 45/89.0]
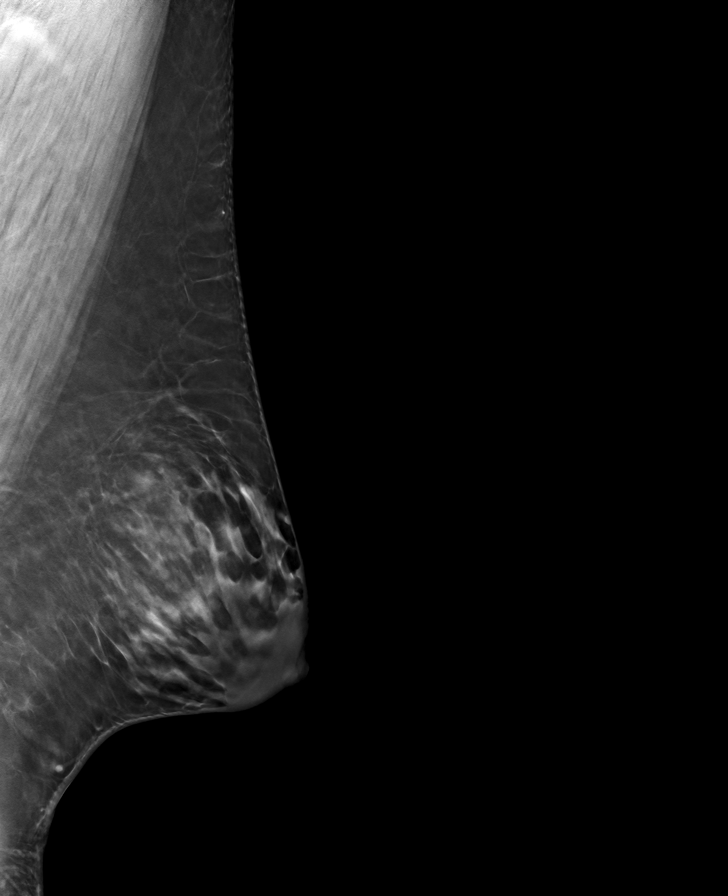

[L CC tomo · tomo slice 47/93.0]
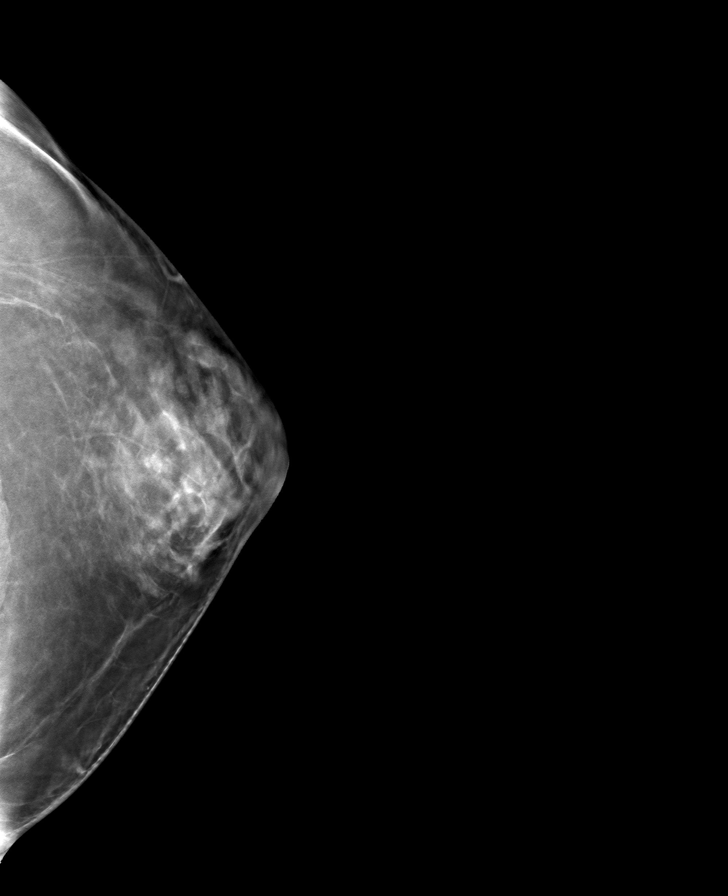

[R MLO tomo · tomo slice 51/100.0]
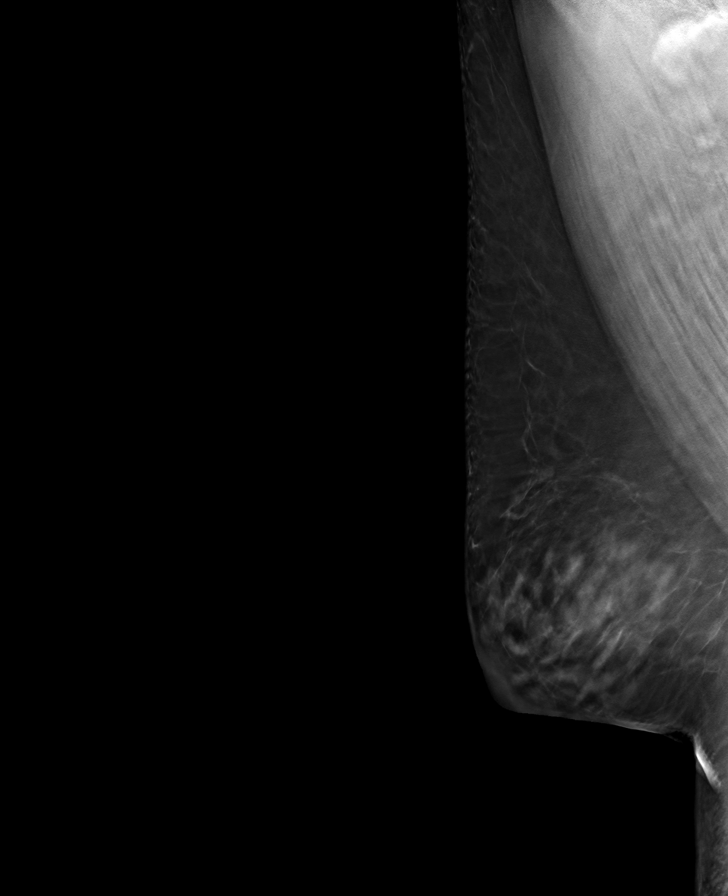

[R CC tomo · tomo slice 47/92.0]
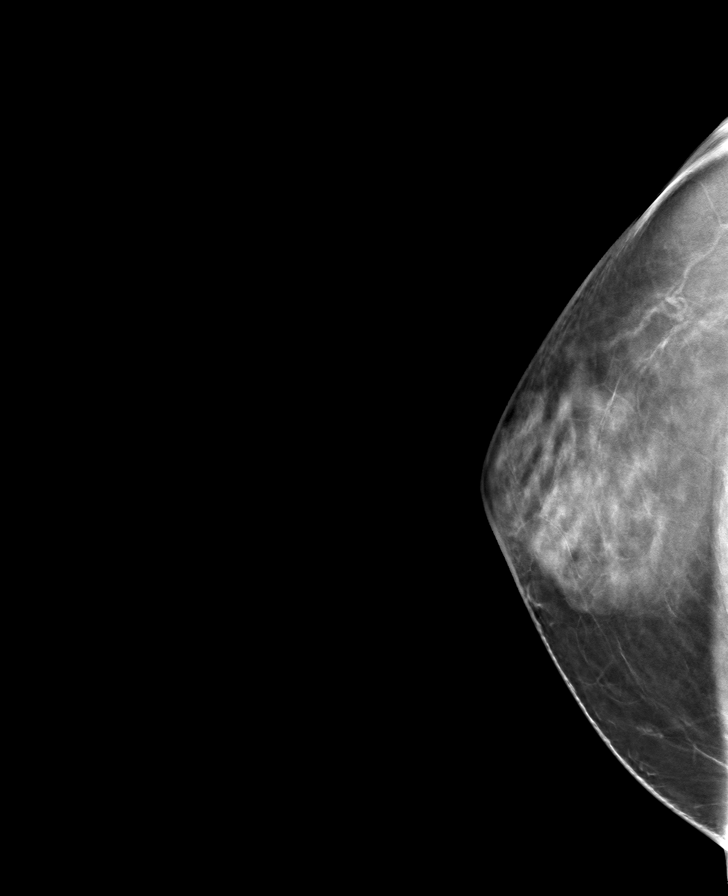

[8 of 24 positions shown; findings below may reference images not displayed]

ACR Breast Density Category c: The breast tissue is heterogeneously
dense, which may obscure small masses
FINDINGS: There are no findings suspicious for malignancy.
IMPRESSION: No mammographic evidence of malignancy. A result letter of this
screening mammogram will be mailed directly to the patient.

RECOMMENDATION:
Screening mammogram in one year. (Code:[6Z])

BI-RADS CATEGORY  1: Negative.

## 2020-09-23 ENCOUNTER — Telehealth: Payer: Self-pay | Admitting: Emergency Medicine

## 2020-09-23 NOTE — Telephone Encounter (Signed)
Post ED Visit - Positive Culture Follow-up  Culture report reviewed by antimicrobial stewardship pharmacist: [facility] []  , Pharm.D. []  Enzo Bi, Pharm.D., BCPS AQ-ID []  , Pharm.D., BCPS []  Celedonio Miyamoto, Pharm.D., BCPS []  [place], Garvin Fila.D., BCPS, AAHIVP []  , Pharm.D., BCPS, AAHIVP []  Georgina Pillion, PharmD, BCPS []  , PharmD, BCPS []  [place], PharmD, BCPS [x]  [address], PharmD []  , PharmD, BCPS []  Estella Husk, PharmD  Pharmacy Team []  Lysle Pearl, PharmD []  , PharmD []  Phillips Climes, PharmD []  , Rph []  Agapito Games) , PharmD []  Loleta Dicker, PharmD []  , PharmD []  Mervyn Gay, PharmD []  , PharmD []  Vinnie Level, PharmD []  [facility], PharmD []  , PharmD []  Len Childs, PharmD   Positive urine culture Treated with Cephalexin, organism sensitive to the same and no further patient follow-up is required at this time.  Lj Miyamoto 09/23/2020, 11:27 AM

## 2020-09-27 DIAGNOSIS — R69 Illness, unspecified: Secondary | ICD-10-CM | POA: Diagnosis not present

## 2020-09-27 DIAGNOSIS — F4310 Post-traumatic stress disorder, unspecified: Secondary | ICD-10-CM | POA: Diagnosis not present

## 2020-10-03 ENCOUNTER — Other Ambulatory Visit: Payer: Self-pay

## 2020-10-03 MED ORDER — FENOFIBRATE 160 MG PO TABS: ORAL_TABLET | ORAL | 0 refills | Status: DC

## 2020-10-04 DIAGNOSIS — R69 Illness, unspecified: Secondary | ICD-10-CM | POA: Diagnosis not present

## 2020-10-04 DIAGNOSIS — F4310 Post-traumatic stress disorder, unspecified: Secondary | ICD-10-CM | POA: Diagnosis not present

## 2020-10-10 ENCOUNTER — Other Ambulatory Visit: Payer: Self-pay

## 2020-10-10 DIAGNOSIS — F640 Transsexualism: Secondary | ICD-10-CM | POA: Diagnosis not present

## 2020-10-10 DIAGNOSIS — R69 Illness, unspecified: Secondary | ICD-10-CM | POA: Diagnosis not present

## 2020-10-10 MED ORDER — TOPIRAMATE 50 MG PO TABS: ORAL_TABLET | ORAL | 0 refills | Status: DC

## 2020-10-12 ENCOUNTER — Encounter: Payer: Self-pay | Admitting: Osteopathic Medicine

## 2020-10-12 DIAGNOSIS — F4310 Post-traumatic stress disorder, unspecified: Secondary | ICD-10-CM | POA: Diagnosis not present

## 2020-10-12 DIAGNOSIS — R69 Illness, unspecified: Secondary | ICD-10-CM | POA: Diagnosis not present

## 2020-10-19 DIAGNOSIS — R69 Illness, unspecified: Secondary | ICD-10-CM | POA: Diagnosis not present

## 2020-10-19 DIAGNOSIS — F4310 Post-traumatic stress disorder, unspecified: Secondary | ICD-10-CM | POA: Diagnosis not present

## 2020-10-20 ENCOUNTER — Telehealth: Payer: Self-pay

## 2020-10-20 NOTE — Telephone Encounter (Signed)
Morrie Sheldon, a case manager from [employer] for [name] called with information if we ever need assistance:  Case Manager: [phone] Provider services: [phone] Surgical pre-certification: [phone]

## 2020-10-22 ENCOUNTER — Encounter: Payer: Self-pay | Admitting: Medical-Surgical

## 2020-10-26 DIAGNOSIS — F4310 Post-traumatic stress disorder, unspecified: Secondary | ICD-10-CM | POA: Diagnosis not present

## 2020-10-26 DIAGNOSIS — R69 Illness, unspecified: Secondary | ICD-10-CM | POA: Diagnosis not present

## 2020-10-31 ENCOUNTER — Other Ambulatory Visit: Payer: Self-pay | Admitting: Osteopathic Medicine

## 2020-11-03 DIAGNOSIS — F4310 Post-traumatic stress disorder, unspecified: Secondary | ICD-10-CM | POA: Diagnosis not present

## 2020-11-03 DIAGNOSIS — R69 Illness, unspecified: Secondary | ICD-10-CM | POA: Diagnosis not present

## 2020-11-10 DIAGNOSIS — F4310 Post-traumatic stress disorder, unspecified: Secondary | ICD-10-CM | POA: Diagnosis not present

## 2020-11-10 DIAGNOSIS — R69 Illness, unspecified: Secondary | ICD-10-CM | POA: Diagnosis not present

## 2020-11-16 DIAGNOSIS — F4310 Post-traumatic stress disorder, unspecified: Secondary | ICD-10-CM | POA: Diagnosis not present

## 2020-11-16 DIAGNOSIS — R69 Illness, unspecified: Secondary | ICD-10-CM | POA: Diagnosis not present

## 2020-11-23 DIAGNOSIS — F4310 Post-traumatic stress disorder, unspecified: Secondary | ICD-10-CM | POA: Diagnosis not present

## 2020-11-23 DIAGNOSIS — R69 Illness, unspecified: Secondary | ICD-10-CM | POA: Diagnosis not present

## 2020-12-01 DIAGNOSIS — F4310 Post-traumatic stress disorder, unspecified: Secondary | ICD-10-CM | POA: Diagnosis not present

## 2020-12-01 DIAGNOSIS — R69 Illness, unspecified: Secondary | ICD-10-CM | POA: Diagnosis not present

## 2020-12-08 DIAGNOSIS — R69 Illness, unspecified: Secondary | ICD-10-CM | POA: Diagnosis not present

## 2020-12-08 DIAGNOSIS — F4310 Post-traumatic stress disorder, unspecified: Secondary | ICD-10-CM | POA: Diagnosis not present

## 2020-12-15 DIAGNOSIS — R69 Illness, unspecified: Secondary | ICD-10-CM | POA: Diagnosis not present

## 2020-12-15 DIAGNOSIS — F4310 Post-traumatic stress disorder, unspecified: Secondary | ICD-10-CM | POA: Diagnosis not present

## 2020-12-20 ENCOUNTER — Other Ambulatory Visit: Payer: Self-pay

## 2020-12-20 NOTE — Progress Notes (Signed)
Error

## 2020-12-21 ENCOUNTER — Other Ambulatory Visit: Payer: Self-pay

## 2020-12-21 DIAGNOSIS — F649 Gender identity disorder, unspecified: Secondary | ICD-10-CM

## 2020-12-21 MED ORDER — ESTRADIOL 0.1 MG/24HR TD PTWK: 0.1000 mg | MEDICATED_PATCH | TRANSDERMAL | 0 refills | Status: DC

## 2020-12-29 DIAGNOSIS — R69 Illness, unspecified: Secondary | ICD-10-CM | POA: Diagnosis not present

## 2020-12-29 DIAGNOSIS — F4310 Post-traumatic stress disorder, unspecified: Secondary | ICD-10-CM | POA: Diagnosis not present

## 2021-01-05 ENCOUNTER — Other Ambulatory Visit: Payer: Self-pay

## 2021-01-05 MED ORDER — FENOFIBRATE 160 MG PO TABS: ORAL_TABLET | ORAL | 0 refills | Status: DC

## 2021-01-05 NOTE — Telephone Encounter (Signed)
Pt needs appt to establish care

## 2021-01-06 NOTE — Telephone Encounter (Signed)
Pt has a transfer of care appt scheduled. AM

## 2021-01-09 ENCOUNTER — Ambulatory Visit: Payer: Medicare HMO | Admitting: Osteopathic Medicine

## 2021-01-10 ENCOUNTER — Ambulatory Visit (INDEPENDENT_AMBULATORY_CARE_PROVIDER_SITE_OTHER): Payer: Medicare HMO | Admitting: Medical-Surgical

## 2021-01-10 ENCOUNTER — Encounter: Payer: Self-pay | Admitting: Medical-Surgical

## 2021-01-10 ENCOUNTER — Other Ambulatory Visit: Payer: Self-pay

## 2021-01-10 VITALS — BP 90/61 | HR 84 | Resp 20 | Ht 70.0 in | Wt 287.0 lb

## 2021-01-10 DIAGNOSIS — Z87898 Personal history of other specified conditions: Secondary | ICD-10-CM

## 2021-01-10 DIAGNOSIS — J0100 Acute maxillary sinusitis, unspecified: Secondary | ICD-10-CM | POA: Diagnosis not present

## 2021-01-10 DIAGNOSIS — F418 Other specified anxiety disorders: Secondary | ICD-10-CM

## 2021-01-10 DIAGNOSIS — F649 Gender identity disorder, unspecified: Secondary | ICD-10-CM | POA: Diagnosis not present

## 2021-01-10 DIAGNOSIS — Z7689 Persons encountering health services in other specified circumstances: Secondary | ICD-10-CM

## 2021-01-10 DIAGNOSIS — M2550 Pain in unspecified joint: Secondary | ICD-10-CM | POA: Diagnosis not present

## 2021-01-10 DIAGNOSIS — R519 Headache, unspecified: Secondary | ICD-10-CM

## 2021-01-10 DIAGNOSIS — Z789 Other specified health status: Secondary | ICD-10-CM | POA: Diagnosis not present

## 2021-01-10 DIAGNOSIS — E7800 Pure hypercholesterolemia, unspecified: Secondary | ICD-10-CM | POA: Diagnosis not present

## 2021-01-10 DIAGNOSIS — R69 Illness, unspecified: Secondary | ICD-10-CM | POA: Diagnosis not present

## 2021-01-10 DIAGNOSIS — G8929 Other chronic pain: Secondary | ICD-10-CM

## 2021-01-10 MED ORDER — SEMAGLUTIDE(0.25 OR 0.5MG/DOS) 2 MG/1.5ML ~~LOC~~ SOPN: 0.5000 mg | PEN_INJECTOR | SUBCUTANEOUS | 1 refills | Status: DC

## 2021-01-10 MED ORDER — ATENOLOL 50 MG PO TABS: ORAL_TABLET | ORAL | 1 refills | Status: DC

## 2021-01-10 MED ORDER — PRIMIDONE 50 MG PO TABS: ORAL_TABLET | ORAL | 1 refills | Status: DC

## 2021-01-10 MED ORDER — FLUTICASONE PROPIONATE 50 MCG/ACT NA SUSP: NASAL | 3 refills | Status: DC

## 2021-01-10 MED ORDER — CYCLOBENZAPRINE HCL 10 MG PO TABS: 10.0000 mg | ORAL_TABLET | Freq: Three times a day (TID) | ORAL | 0 refills | Status: DC | PRN

## 2021-01-10 MED ORDER — GLYCOPYRROLATE 1 MG PO TABS: 2.0000 mg | ORAL_TABLET | Freq: Two times a day (BID) | ORAL | 3 refills | Status: DC

## 2021-01-10 MED ORDER — DEPO-ESTRADIOL 5 MG/ML IM OIL: TOPICAL_OIL | INTRAMUSCULAR | 0 refills | Status: DC

## 2021-01-10 MED ORDER — SUMATRIPTAN SUCCINATE 50 MG PO TABS: 50.0000 mg | ORAL_TABLET | ORAL | 0 refills | Status: DC | PRN

## 2021-01-10 NOTE — Progress Notes (Signed)
Established patient office visit  HPI with pertinent ROS:   CC: transfer of care  HPI: Pleasant 46 year old transgender male presenting today to transfer care to a new PCP. Is currently using estrogen patches weekly as prescribed we will notes that she would like to go back to the estradiol injections that she was using previously.  Stop the injections because she had difficulty getting the medication from the pharmacy due to back orders.  Previously he used estradiol cypionate 4 mg IM weekly, proficient at self injection at home.  Has been working with [facility] to have top surgery.  Has an appointment with them on 1/23 for further evaluation and medication planning.  Requesting a different dose of glycopyrrolate.  Is taking 1-1/2 mg twice daily rather than 2 mg.  Notes the 1 mg twice daily dose was ineffective.  Requesting a refill on Flexeril 10 mg 3 times daily as needed due to polyarthralgias.  Does note a new onset warm sensation in her back when rising from a hunched position that has been occurring for about 1-1/2 weeks.  No weakness of the lower extremities, lower extremity paresthesias, saddle paresthesias, or her new onset urinary incontinence.  Known issues with musculoskeletal issues including arthritis in the lumbar spine.  No other associated symptoms such as dizziness, headaches, or vision changes.  Having difficulty with obtaining semaglutide.  Has been out of this for 2-1/2 to 3 weeks due to supply issues.  On this for management of prediabetes and would like to resume as soon as possible.  Requesting a refill on Imitrex to use as needed for migraines.  Uses this very infrequently however does feel that it is helpful to have on hand when she needs it.  I reviewed the past medical history, family history, social history, surgical history, and allergies today and no changes were needed.  Please see the problem list section below in epic for further details.   Brief exam,  Assessment, and Plan:   Today's Vitals: BP 90/61 (BP Location: Left Arm, Patient Position: Sitting, Cuff Size: Normal)    Pulse 84    Resp 20    Ht 5\' 10"  (1.778 m)    Wt 287 lb (130.2 kg)    SpO2 96%    BMI 41.18 kg/m   1. Encounter to establish care Reviewed available information and discussed care concerns with patient.   2. Gender dysphoria 3. Transgender Lab orders entered to have drawn in betwee estradiol injections.  Discontinue estradiol patch.  Switching to Depo estradiol 4 mg IM once weekly.  Patient aware to have labs drawn in between injections on the middle day. - Estradiol - Testosterone - CBC - Lipid panel - COMPLETE METABOLIC PANEL WITH GFR - Hemoglobin A1c - DEPO-ESTRADIOL 5 MG/ML injection; INJECT 0.8MLS UNDER THE SKIN ONCE WEEKLY  Dispense: 5 mL; Refill: 0  4. History of prediabetes Checking CMP and hemoglobin A1c.  Reordering semaglutide.  Reaching out to our pharmacist to see if there are supply issues or avenues that we can use to get her back on her medication. - COMPLETE METABOLIC PANEL WITH GFR - Hemoglobin A1c  5. High cholesterol Checking lipid panel today. - Lipid panel  6. Polyarthralgia Refilling cyclobenzaprine 10 mg 3 times daily as needed.  7. Chronic nonintractable headache, unspecified headache type Refilling Imitrex 50 mg once daily as needed, repeat dose in 2 hours if not effective.  8.  Depression with anxiety Alert and oriented today with somewhat depressed mood.  Is very  frustrated with the issues regarding top surgery.  Continue amitriptyline 25 mg nightly, bupropion 450 mg daily, and duloxetine 60 mg daily as prescribed.  Return in about 3 months (around 04/10/2021) for gender dysphoria follow up. ___________________________________________ Thayer Ohm, DNP, APRN, FNP-BC Primary Care and Sports Medicine [facility] [place]

## 2021-01-10 NOTE — Addendum Note (Signed)
Addended by: Gonzella Lex R on: 01/10/2021 04:05 PM   Modules accepted: Orders

## 2021-01-12 DIAGNOSIS — R69 Illness, unspecified: Secondary | ICD-10-CM | POA: Diagnosis not present

## 2021-01-12 DIAGNOSIS — F4310 Post-traumatic stress disorder, unspecified: Secondary | ICD-10-CM | POA: Diagnosis not present

## 2021-01-19 DIAGNOSIS — F4310 Post-traumatic stress disorder, unspecified: Secondary | ICD-10-CM | POA: Diagnosis not present

## 2021-01-19 DIAGNOSIS — R69 Illness, unspecified: Secondary | ICD-10-CM | POA: Diagnosis not present

## 2021-01-23 DIAGNOSIS — Z0389 Encounter for observation for other suspected diseases and conditions ruled out: Secondary | ICD-10-CM | POA: Diagnosis not present

## 2021-01-23 DIAGNOSIS — E232 Diabetes insipidus: Secondary | ICD-10-CM | POA: Diagnosis not present

## 2021-01-23 DIAGNOSIS — Z720 Tobacco use: Secondary | ICD-10-CM | POA: Diagnosis not present

## 2021-01-26 DIAGNOSIS — R69 Illness, unspecified: Secondary | ICD-10-CM | POA: Diagnosis not present

## 2021-01-26 DIAGNOSIS — Z87898 Personal history of other specified conditions: Secondary | ICD-10-CM | POA: Diagnosis not present

## 2021-01-26 DIAGNOSIS — F4310 Post-traumatic stress disorder, unspecified: Secondary | ICD-10-CM | POA: Diagnosis not present

## 2021-01-26 DIAGNOSIS — Z789 Other specified health status: Secondary | ICD-10-CM | POA: Diagnosis not present

## 2021-01-26 DIAGNOSIS — E7800 Pure hypercholesterolemia, unspecified: Secondary | ICD-10-CM | POA: Diagnosis not present

## 2021-01-26 DIAGNOSIS — R7309 Other abnormal glucose: Secondary | ICD-10-CM | POA: Diagnosis not present

## 2021-01-27 LAB — LIPID PANEL
Cholesterol: 208 mg/dL — ABNORMAL HIGH (ref <200)
HDL: 29 mg/dL — ABNORMAL LOW (ref >=40)
LDL Cholesterol (Calc): 137 mg/dL (calc) — ABNORMAL HIGH
Non-HDL Cholesterol (Calc): 179 mg/dL (calc) — ABNORMAL HIGH (ref <130)
Total CHOL/HDL Ratio: 7.2 (calc) — ABNORMAL HIGH (ref <5.0)
Triglycerides: 274 mg/dL — ABNORMAL HIGH (ref <150)

## 2021-01-27 LAB — COMPLETE METABOLIC PANEL WITH GFR
AG Ratio: 1.7 (calc) (ref 1.0–2.5)
ALT: 13 U/L (ref 9–46)
AST: 19 U/L (ref 10–40)
Albumin: 4.1 g/dL (ref 3.6–5.1)
Alkaline phosphatase (APISO): 41 U/L (ref 36–130)
BUN/Creatinine Ratio: 11 (calc) (ref 6–22)
BUN: 16 mg/dL (ref 7–25)
CO2: 27 mmol/L (ref 20–32)
Calcium: 8.9 mg/dL (ref 8.6–10.3)
Chloride: 101 mmol/L (ref 98–110)
Creat: 1.42 mg/dL — ABNORMAL HIGH (ref 0.60–1.29)
Globulin: 2.4 g/dL (calc) (ref 1.9–3.7)
Glucose, Bld: 119 mg/dL — ABNORMAL HIGH (ref 65–99)
Potassium: 4.3 mmol/L (ref 3.5–5.3)
Sodium: 136 mmol/L (ref 135–146)
Total Bilirubin: 0.5 mg/dL (ref 0.2–1.2)
Total Protein: 6.5 g/dL (ref 6.1–8.1)
eGFR: 63 mL/min/{1.73_m2} (ref >=60)

## 2021-01-27 LAB — CBC
HCT: 38.1 % — ABNORMAL LOW (ref 38.5–50.0)
Hemoglobin: 12.7 g/dL — ABNORMAL LOW (ref 13.2–17.1)
MCH: 30.8 pg (ref 27.0–33.0)
MCHC: 33.3 g/dL (ref 32.0–36.0)
MCV: 92.5 fL (ref 80.0–100.0)
MPV: 8.7 fL (ref 7.5–12.5)
Platelets: 425 10*3/uL — ABNORMAL HIGH (ref 140–400)
RBC: 4.12 10*6/uL — ABNORMAL LOW (ref 4.20–5.80)
RDW: 11.5 % (ref 11.0–15.0)
WBC: 8.1 10*3/uL (ref 3.8–10.8)

## 2021-01-27 LAB — TESTOSTERONE: Testosterone: 11 ng/dL — ABNORMAL LOW (ref 250–827)

## 2021-01-27 LAB — ESTRADIOL: Estradiol: 63 pg/mL — ABNORMAL HIGH (ref <=39)

## 2021-01-27 LAB — HEMOGLOBIN A1C
Hgb A1c MFr Bld: 5.6 % of total Hgb (ref <5.7)
Mean Plasma Glucose: 114 mg/dL
eAG (mmol/L): 6.3 mmol/L

## 2021-01-27 MED ORDER — ATORVASTATIN CALCIUM 10 MG PO TABS: 10.0000 mg | ORAL_TABLET | Freq: Every day | ORAL | 3 refills | Status: DC

## 2021-01-27 NOTE — Addendum Note (Signed)
Addended byChristen Butter on: 01/27/2021 05:47 PM   Modules accepted: Orders

## 2021-02-01 ENCOUNTER — Other Ambulatory Visit: Payer: Self-pay

## 2021-02-01 DIAGNOSIS — F418 Other specified anxiety disorders: Secondary | ICD-10-CM

## 2021-02-01 DIAGNOSIS — E7800 Pure hypercholesterolemia, unspecified: Secondary | ICD-10-CM

## 2021-02-01 DIAGNOSIS — Z789 Other specified health status: Secondary | ICD-10-CM

## 2021-02-01 MED ORDER — BUPROPION HCL ER (XL) 450 MG PO TB24: 1.0000 | ORAL_TABLET | Freq: Every day | ORAL | 0 refills | Status: DC

## 2021-02-01 MED ORDER — SPIRONOLACTONE 50 MG PO TABS: 50.0000 mg | ORAL_TABLET | Freq: Two times a day (BID) | ORAL | 0 refills | Status: DC

## 2021-02-01 MED ORDER — LISINOPRIL 5 MG PO TABS: 5.0000 mg | ORAL_TABLET | Freq: Every day | ORAL | 0 refills | Status: DC

## 2021-02-02 DIAGNOSIS — F4310 Post-traumatic stress disorder, unspecified: Secondary | ICD-10-CM | POA: Diagnosis not present

## 2021-02-02 DIAGNOSIS — N6489 Other specified disorders of breast: Secondary | ICD-10-CM | POA: Insufficient documentation

## 2021-02-02 DIAGNOSIS — R69 Illness, unspecified: Secondary | ICD-10-CM | POA: Diagnosis not present

## 2021-02-07 ENCOUNTER — Encounter: Payer: Self-pay | Admitting: Medical-Surgical

## 2021-02-08 DIAGNOSIS — F4310 Post-traumatic stress disorder, unspecified: Secondary | ICD-10-CM | POA: Diagnosis not present

## 2021-02-08 DIAGNOSIS — R69 Illness, unspecified: Secondary | ICD-10-CM | POA: Diagnosis not present

## 2021-02-09 ENCOUNTER — Other Ambulatory Visit: Payer: Self-pay | Admitting: Physician Assistant

## 2021-02-14 ENCOUNTER — Telehealth: Payer: Self-pay

## 2021-02-14 NOTE — Telephone Encounter (Addendum)
Initiated Prior authorization [id number] 5 MG/ML  Via: Covermymeds Case/Key:[id number] Status: approved  as of 02/14/21 Reason:01/01/2021 - 12/31/2021, u Notified Pt via: Mychart

## 2021-02-15 ENCOUNTER — Ambulatory Visit (INDEPENDENT_AMBULATORY_CARE_PROVIDER_SITE_OTHER): Payer: Medicare HMO | Admitting: Family Medicine

## 2021-02-20 ENCOUNTER — Telehealth: Payer: Medicare HMO | Admitting: Physician Assistant

## 2021-02-20 DIAGNOSIS — B9689 Other specified bacterial agents as the cause of diseases classified elsewhere: Secondary | ICD-10-CM

## 2021-02-20 DIAGNOSIS — J019 Acute sinusitis, unspecified: Secondary | ICD-10-CM

## 2021-02-20 MED ORDER — PREDNISONE 20 MG PO TABS: 20.0000 mg | ORAL_TABLET | Freq: Every day | ORAL | 0 refills | Status: DC

## 2021-02-20 MED ORDER — DOXYCYCLINE HYCLATE 100 MG PO TABS: 100.0000 mg | ORAL_TABLET | Freq: Two times a day (BID) | ORAL | 0 refills | Status: DC

## 2021-02-20 NOTE — Progress Notes (Signed)
Virtual Visit Consent   Gary Sullivan, you are scheduled for a virtual visit with a [facility] provider today.     Just as with appointments in the office, your consent must be obtained to participate.  Your consent will be active for this visit and any virtual visit you may have with one of our providers in the next 365 days.     If you have a MyChart account, a copy of this consent can be sent to you electronically.  All virtual visits are billed to your insurance company just like a traditional visit in the office.    As this is a virtual visit, video technology does not allow for your provider to perform a traditional examination.  This may limit your provider's ability to fully assess your condition.  If your provider identifies any concerns that need to be evaluated in person or the need to arrange testing (such as labs, EKG, etc.), we will make arrangements to do so.     Although advances in technology are sophisticated, we cannot ensure that it will always work on either your end or our end.  If the connection with a video visit is poor, the visit may have to be switched to a telephone visit.  With either a video or telephone visit, we are not always able to ensure that we have a secure connection.     I need to obtain your verbal consent now.   Are you willing to proceed with your visit today?    Damauri Minion has provided verbal consent on 02/20/2021 for a virtual visit (video or telephone).   [name], PA-C   Date: 02/20/2021 8:13 AM   Virtual Visit via Video Note   I, [name], connected with  Atlee Kluth  ([id number], 02/19/1975) on 02/20/21 at  8:15 AM EST by a video-enabled telemedicine application and verified that I am speaking with the correct person using two identifiers.  Location: Patient: Virtual Visit Location Patient: Home Provider: Virtual Visit Location Provider: Home Office   I discussed the limitations of evaluation and management  by telemedicine and the availability of in person appointments. The patient expressed understanding and agreed to proceed.    History of Present Illness: Derelle Cockrell is a 46 y.o. who identifies as a transgender male who was assigned adult at birth, and is being seen today for possible sinus infection.  HPI: Sinusitis This is a new problem. The current episode started 1 to 4 weeks ago. The problem has been gradually worsening since onset. There has been no fever. Associated symptoms include congestion, coughing, ear pain (right is worse), headaches and sinus pressure. Pertinent negatives include no chills, hoarse voice, shortness of breath or sore throat. (Tooth pain with it , fatigue, post nasal drainage) Past treatments include acetaminophen (ibuprofen, mucinex sinus max, afrin, saline nasal rinses, allergy nasal spray). The treatment provided no relief.     Problems:  Patient Active Problem List   Diagnosis Date Noted   Pre-diabetes 08/16/2020   Polyarthralgia 05/10/2020   Chronic right shoulder pain 03/29/2020   Broken or cracked tooth, nontraumatic 02/09/2020   Poor dentition 02/09/2020   Tooth pain 02/09/2020   History of prediabetes 06/04/2019   Transgender 04/21/2019   Carpal tunnel syndrome on right 10/07/2018   OSA (obstructive sleep apnea) 06/19/2018   Excessive daytime sleepiness 06/19/2018   Numbness 05/28/2018   Tremor 05/28/2018   Fibromyalgia 05/28/2018   Depression with anxiety 05/28/2018   High cholesterol 01/02/2012  High blood pressure 01/02/2008   IBS (irritable bowel syndrome) 01/01/2006   GERD (gastroesophageal reflux disease) 01/01/2005   Social anxiety disorder 01/02/1988   Gender dysphoria 01/01/1985   Asthma 01/01/1982    Allergies:  Allergies  Allergen Reactions   Molds & Smuts Cough, Hives, Itching and Shortness Of Breath   Dog Epithelium Allergy Skin Test Itching and Other (See Comments)    Other reaction(s): Other (See Comments)     Sulfamethoxazole-Trimethoprim Other (See Comments), Diarrhea, Nausea Only and Rash    Other reaction(s): Abdominal Pain    Grass Pollen(K-O-[id number]-Swt Vern)    Sulfa Antibiotics     Pain, upset stomach, vomiting   Cat Hair Extract Hives, Itching and Rash   Medications:  Current Outpatient Medications:    doxycycline (VIBRA-TABS) 100 MG tablet, Take 1 tablet (100 mg total) by mouth 2 (two) times daily., Disp: 20 tablet, Rfl: 0   predniSONE (DELTASONE) 20 MG tablet, Take 1 tablet (20 mg total) by mouth daily with breakfast., Disp: 5 tablet, Rfl: 0   amitriptyline (ELAVIL) 25 MG tablet, Take 1 tablet (25 mg total) by mouth at bedtime., Disp: 90 tablet, Rfl: 3   atenolol (TENORMIN) 50 MG tablet, TAKE 1 TABLET(50 MG) BY MOUTH DAILY, Disp: 90 tablet, Rfl: 1   atorvastatin (LIPITOR) 10 MG tablet, Take 1 tablet (10 mg total) by mouth daily., Disp: 90 tablet, Rfl: 3   bicalutamide (CASODEX) 50 MG tablet, Take 50 mg by mouth daily., Disp: , Rfl:    buPROPion HCl ER, XL, 450 MG TB24, Take 1 tablet by mouth daily., Disp: 90 tablet, Rfl: 0   Cholecalciferol (VITAMIN D3 PO), Take 50,000 Units by mouth once a week., Disp: , Rfl:    cholestyramine (QUESTRAN) 4 g packet, Take 1 packet (4 g total) by mouth daily., Disp: 60 each, Rfl: 3   cyclobenzaprine (FLEXERIL) 10 MG tablet, Take 1 tablet (10 mg total) by mouth 3 (three) times daily as needed for muscle spasms. MILD/MODERATE SPASM, Disp: 90 tablet, Rfl: 0   DEPO-ESTRADIOL 5 MG/ML injection, INJECT 0.8MLS UNDER THE SKIN ONCE WEEKLY, Disp: 5 mL, Rfl: 0   dexlansoprazole (DEXILANT) 60 MG capsule, TAKE 1 CAPSULE(60 MG) BY MOUTH DAILY, Disp: 90 capsule, Rfl: 1   dicyclomine (BENTYL) 20 MG tablet, Take 1 tablet (20 mg total) by mouth every 6 (six) hours as needed for spasms., Disp: 180 tablet, Rfl: 3   DIGESTIVE ENZYMES PO, Take 1 Dose by mouth daily. Digestive enzymes/probiotic, Disp: , Rfl:    diphenhydrAMINE (BENADRYL) 25 mg capsule, Take 1 capsule (25 mg total)  by mouth every 6 (six) hours as needed (headache)., Disp: 30 capsule, Rfl: 1   DULoxetine (CYMBALTA) 60 MG capsule, Take 1 capsule (60 mg total) by mouth daily., Disp: 90 capsule, Rfl: 3   fenofibrate 160 MG tablet, Take 1 tablet by mouth daily., Disp: 90 tablet, Rfl: 1   fluticasone (FLONASE) 50 MCG/ACT nasal spray, Shake liquid and instill 2 sprays in each nostril daily., Disp: 16 g, Rfl: 3   glycopyrrolate (ROBINUL) 1 MG tablet, Take 2 tablets (2 mg total) by mouth 2 (two) times daily., Disp: 180 tablet, Rfl: 3   ibuprofen (ADVIL) 800 MG tablet, TAKE 1 TABLET(800 MG) BY MOUTH THREE TIMES DAILY, Disp: 90 tablet, Rfl: 0   Insulin Pen Needle (B-D ULTRAFINE III SHORT PEN) 31G X 8 MM MISC, USE AS DIRECTED, Disp: , Rfl:    ketorolac (TORADOL) 10 MG tablet, Take 1 tablet (10 mg total) by mouth every  6 (six) hours as needed (headache)., Disp: 30 tablet, Rfl: 0   lisinopril (ZESTRIL) 5 MG tablet, Take 1 tablet (5 mg total) by mouth daily., Disp: 90 tablet, Rfl: 0   metoCLOPramide (REGLAN) 10 MG tablet, Take 1 tablet (10 mg total) by mouth every 6 (six) hours as needed (headache)., Disp: 30 tablet, Rfl: 1   naproxen (NAPROSYN) 500 MG tablet, TAKE 1 TABLET( 500 MG TOTAL) BY MOUTH TWICE DAILY WITH A MEAL. TAKE EVERY DAY FOR 1 WEEK, THEN USE AS NEEDED AFTER THAT, Disp: 60 tablet, Rfl: 1   NEEDLE, DISP, 18 G (B-D HYPODERMIC NEEDLE 18GX1.5") 18G X 1-1/2" MISC, Use 1 Needle free injection every 7 (seven) days To draw up estrogen, Disp: , Rfl:    Omega-3 1000 MG CAPS, Take by mouth., Disp: , Rfl:    primidone (MYSOLINE) 50 MG tablet, TAKE 1 TABLET(50 MG) BY MOUTH TWICE DAILY, Disp: 180 tablet, Rfl: 1   Semaglutide,0.25 or 0.5MG /DOS, 2 MG/1.5ML SOPN, Inject 0.5 mg into the skin once a week., Disp: 4.5 mL, Rfl: 1   spironolactone (ALDACTONE) 50 MG tablet, Take 1 tablet (50 mg total) by mouth 2 (two) times daily., Disp: 180 tablet, Rfl: 0   SUMAtriptan (IMITREX) 50 MG tablet, Take 1 tablet (50 mg total) by mouth  every 2 (two) hours as needed for migraine. May repeat in 2 hours if headache persists or recurs. Max 150 mg per 24 hours., Disp: 10 tablet, Rfl: 0   topiramate (TOPAMAX) 50 MG tablet, TAKE 1 TO 2 TABLETS BY MOUTH EVERY NIGHT AT BEDTIME. (Future refill requests should go to PCP or pt should schedule F/U), Disp: 30 tablet, Rfl: 0  Current Facility-Administered Medications:    0.9 %  sodium chloride infusion, 500 mL, Intravenous, Once, Cirigliano, Vito V, DO  Observations/Objective: Patient is well-developed, well-nourished in no acute distress.  Resting comfortably at home.  Head is normocephalic, atraumatic.  No labored breathing. Speech is clear and coherent with logical content.  Patient is alert and oriented at baseline.    Assessment and Plan: 1. Acute bacterial sinusitis - doxycycline (VIBRA-TABS) 100 MG tablet; Take 1 tablet (100 mg total) by mouth 2 (two) times daily.  Dispense: 20 tablet; Refill: 0 - predniSONE (DELTASONE) 20 MG tablet; Take 1 tablet (20 mg total) by mouth daily with breakfast.  Dispense: 5 tablet; Refill: 0  - Worsening symptoms that have not responded to OTC medications.  - Will give Doxycycline and Prednisone.  - Continue allergy medications - Steam treatment with or without echinacea essential oil can help - Humidifier - Stay well hydrated and get plenty of rest.  - Seek in person evaluation if no symptom improvement or if symptoms worsen.   Follow Up Instructions: I discussed the assessment and treatment plan with the patient. The patient was provided an opportunity to ask questions and all were answered. The patient agreed with the plan and demonstrated an understanding of the instructions.  A copy of instructions were sent to the patient via MyChart unless otherwise noted below.   The patient was advised to call back or seek an in-person evaluation if the symptoms worsen or if the condition fails to improve as anticipated.  Time:  I spent 13 minutes  with the patient via telehealth technology discussing the above problems/concerns.    [name], PA-C

## 2021-02-20 NOTE — Patient Instructions (Signed)
[name], thank you for joining [name], PA-C for today's virtual visit.  While this provider is not your primary care provider (PCP), if your PCP is located in our provider database this encounter information will be shared with them immediately following your visit.  Consent: (Patient) Garner Vanhaitsma provided verbal consent for this virtual visit at the beginning of the encounter.  Current Medications:  Current Outpatient Medications:    doxycycline (VIBRA-TABS) 100 MG tablet, Take 1 tablet (100 mg total) by mouth 2 (two) times daily., Disp: 20 tablet, Rfl: 0   predniSONE (DELTASONE) 20 MG tablet, Take 1 tablet (20 mg total) by mouth daily with breakfast., Disp: 5 tablet, Rfl: 0   amitriptyline (ELAVIL) 25 MG tablet, Take 1 tablet (25 mg total) by mouth at bedtime., Disp: 90 tablet, Rfl: 3   atenolol (TENORMIN) 50 MG tablet, TAKE 1 TABLET(50 MG) BY MOUTH DAILY, Disp: 90 tablet, Rfl: 1   atorvastatin (LIPITOR) 10 MG tablet, Take 1 tablet (10 mg total) by mouth daily., Disp: 90 tablet, Rfl: 3   bicalutamide (CASODEX) 50 MG tablet, Take 50 mg by mouth daily., Disp: , Rfl:    buPROPion HCl ER, XL, 450 MG TB24, Take 1 tablet by mouth daily., Disp: 90 tablet, Rfl: 0   Cholecalciferol (VITAMIN D3 PO), Take 50,000 Units by mouth once a week., Disp: , Rfl:    cholestyramine (QUESTRAN) 4 g packet, Take 1 packet (4 g total) by mouth daily., Disp: 60 each, Rfl: 3   cyclobenzaprine (FLEXERIL) 10 MG tablet, Take 1 tablet (10 mg total) by mouth 3 (three) times daily as needed for muscle spasms. MILD/MODERATE SPASM, Disp: 90 tablet, Rfl: 0   DEPO-ESTRADIOL 5 MG/ML injection, INJECT 0.8MLS UNDER THE SKIN ONCE WEEKLY, Disp: 5 mL, Rfl: 0   dexlansoprazole (DEXILANT) 60 MG capsule, TAKE 1 CAPSULE(60 MG) BY MOUTH DAILY, Disp: 90 capsule, Rfl: 1   dicyclomine (BENTYL) 20 MG tablet, Take 1 tablet (20 mg total) by mouth every 6 (six) hours as needed for spasms., Disp: 180 tablet, Rfl: 3    DIGESTIVE ENZYMES PO, Take 1 Dose by mouth daily. Digestive enzymes/probiotic, Disp: , Rfl:    diphenhydrAMINE (BENADRYL) 25 mg capsule, Take 1 capsule (25 mg total) by mouth every 6 (six) hours as needed (headache)., Disp: 30 capsule, Rfl: 1   DULoxetine (CYMBALTA) 60 MG capsule, Take 1 capsule (60 mg total) by mouth daily., Disp: 90 capsule, Rfl: 3   fenofibrate 160 MG tablet, Take 1 tablet by mouth daily., Disp: 90 tablet, Rfl: 1   fluticasone (FLONASE) 50 MCG/ACT nasal spray, Shake liquid and instill 2 sprays in each nostril daily., Disp: 16 g, Rfl: 3   glycopyrrolate (ROBINUL) 1 MG tablet, Take 2 tablets (2 mg total) by mouth 2 (two) times daily., Disp: 180 tablet, Rfl: 3   ibuprofen (ADVIL) 800 MG tablet, TAKE 1 TABLET(800 MG) BY MOUTH THREE TIMES DAILY, Disp: 90 tablet, Rfl: 0   Insulin Pen Needle (B-D ULTRAFINE III SHORT PEN) 31G X 8 MM MISC, USE AS DIRECTED, Disp: , Rfl:    ketorolac (TORADOL) 10 MG tablet, Take 1 tablet (10 mg total) by mouth every 6 (six) hours as needed (headache)., Disp: 30 tablet, Rfl: 0   lisinopril (ZESTRIL) 5 MG tablet, Take 1 tablet (5 mg total) by mouth daily., Disp: 90 tablet, Rfl: 0   metoCLOPramide (REGLAN) 10 MG tablet, Take 1 tablet (10 mg total) by mouth every 6 (six) hours as needed (headache)., Disp: 30 tablet, Rfl:  1   naproxen (NAPROSYN) 500 MG tablet, TAKE 1 TABLET( 500 MG TOTAL) BY MOUTH TWICE DAILY WITH A MEAL. TAKE EVERY DAY FOR 1 WEEK, THEN USE AS NEEDED AFTER THAT, Disp: 60 tablet, Rfl: 1   NEEDLE, DISP, 18 G (B-D HYPODERMIC NEEDLE 18GX1.5") 18G X 1-1/2" MISC, Use 1 Needle free injection every 7 (seven) days To draw up estrogen, Disp: , Rfl:    Omega-3 1000 MG CAPS, Take by mouth., Disp: , Rfl:    primidone (MYSOLINE) 50 MG tablet, TAKE 1 TABLET(50 MG) BY MOUTH TWICE DAILY, Disp: 180 tablet, Rfl: 1   Semaglutide,0.25 or 0.5MG /DOS, 2 MG/1.5ML SOPN, Inject 0.5 mg into the skin once a week., Disp: 4.5 mL, Rfl: 1   spironolactone (ALDACTONE) 50 MG  tablet, Take 1 tablet (50 mg total) by mouth 2 (two) times daily., Disp: 180 tablet, Rfl: 0   SUMAtriptan (IMITREX) 50 MG tablet, Take 1 tablet (50 mg total) by mouth every 2 (two) hours as needed for migraine. May repeat in 2 hours if headache persists or recurs. Max 150 mg per 24 hours., Disp: 10 tablet, Rfl: 0   topiramate (TOPAMAX) 50 MG tablet, TAKE 1 TO 2 TABLETS BY MOUTH EVERY NIGHT AT BEDTIME. (Future refill requests should go to PCP or pt should schedule F/U), Disp: 30 tablet, Rfl: 0  Current Facility-Administered Medications:    0.9 %  sodium chloride infusion, 500 mL, Intravenous, Once, Cirigliano, Vito V, DO   Medications ordered in this encounter:  Meds ordered this encounter  Medications   doxycycline (VIBRA-TABS) 100 MG tablet    Sig: Take 1 tablet (100 mg total) by mouth 2 (two) times daily.    Dispense:  20 tablet    Refill:  0    Order Specific Question:   Supervising Provider    Answer:   Hyacinth Meeker, BRIAN [[id number]]   predniSONE (DELTASONE) 20 MG tablet    Sig: Take 1 tablet (20 mg total) by mouth daily with breakfast.    Dispense:  5 tablet    Refill:  0    Order Specific Question:   Supervising Provider    Answer:   Hyacinth Meeker, BRIAN [[id number]]     *If you need refills on other medications prior to your next appointment, please contact your pharmacy*  Follow-Up: Call back or seek an in-person evaluation if the symptoms worsen or if the condition fails to improve as anticipated.  Other Instructions Sinusitis, Adult Sinusitis is inflammation of your sinuses. Sinuses are hollow spaces in the bones around your face. Your sinuses are located: Around your eyes. In the middle of your forehead. Behind your nose. In your cheekbones. Mucus normally drains out of your sinuses. When your nasal tissues become inflamed or swollen, mucus can become trapped or blocked. This allows bacteria, viruses, and fungi to grow, which leads to infection. Most infections of the sinuses are caused by a  virus. Sinusitis can develop quickly. It can last for up to 4 weeks (acute) or for more than 12 weeks (chronic). Sinusitis often develops after a cold. What are the causes? This condition is caused by anything that creates swelling in the sinuses or stops mucus from draining. This includes: Allergies. Asthma. Infection from bacteria or viruses. Deformities or blockages in your nose or sinuses. Abnormal growths in the nose (nasal polyps). Pollutants, such as chemicals or irritants in the air. Infection from fungi (rare). What increases the risk? You are more likely to develop this condition if you: Have a weak body  defense system (immune system). Do a lot of swimming or diving. Overuse nasal sprays. Smoke. What are the signs or symptoms? The main symptoms of this condition are pain and a feeling of pressure around the affected sinuses. Other symptoms include: Stuffy nose or congestion. Thick drainage from your nose. Swelling and warmth over the affected sinuses. Headache. Upper toothache. A cough that may get worse at night. Extra mucus that collects in the throat or the back of the nose (postnasal drip). Decreased sense of smell and taste. Fatigue. A fever. Sore throat. Bad breath. How is this diagnosed? This condition is diagnosed based on: Your symptoms. Your medical history. A physical exam. Tests to find out if your condition is acute or chronic. This may include: Checking your nose for nasal polyps. Viewing your sinuses using a device that has a light (endoscope). Testing for allergies or bacteria. Imaging tests, such as an MRI or CT scan. In rare cases, a bone biopsy may be done to rule out more serious types of fungal sinus disease. How is this treated? Treatment for sinusitis depends on the cause and whether your condition is chronic or acute. If caused by a virus, your symptoms should go away on their own within 10 days. You may be given medicines to relieve  symptoms. They include: Medicines that shrink swollen nasal passages (topical intranasal decongestants). Medicines that treat allergies (antihistamines). A spray that eases inflammation of the nostrils (topical intranasal corticosteroids). Rinses that help get rid of thick mucus in your nose (nasal saline washes). If caused by bacteria, your health care provider may recommend waiting to see if your symptoms improve. Most bacterial infections will get better without antibiotic medicine. You may be given antibiotics if you have: A severe infection. A weak immune system. If caused by narrow nasal passages or nasal polyps, you may need to have surgery. Follow these instructions at home: Medicines Take, use, or apply over-the-counter and prescription medicines only as told by your health care provider. These may include nasal sprays. If you were prescribed an antibiotic medicine, take it as told by your health care provider. Do not stop taking the antibiotic even if you start to feel better. Hydrate and humidify  Drink enough fluid to keep your urine pale yellow. Staying hydrated will help to thin your mucus. Use a cool mist humidifier to keep the humidity level in your home above 50%. Inhale steam for 10-15 minutes, 3-4 times a day, or as told by your health care provider. You can do this in the bathroom while a hot shower is running. Limit your exposure to cool or dry air. Rest Rest as much as possible. Sleep with your head raised (elevated). Make sure you get enough sleep each night. General instructions  Apply a warm, moist washcloth to your face 3-4 times a day or as told by your health care provider. This will help with discomfort. Wash your hands often with soap and water to reduce your exposure to germs. If soap and water are not available, use hand sanitizer. Do not smoke. Avoid being around people who are smoking (secondhand smoke). Keep all follow-up visits as told by your health  care provider. This is important. Contact a health care provider if: You have a fever. Your symptoms get worse. Your symptoms do not improve within 10 days. Get help right away if: You have a severe headache. You have persistent vomiting. You have severe pain or swelling around your face or eyes. You have vision problems. You  develop confusion. Your neck is stiff. You have trouble breathing. Summary Sinusitis is soreness and inflammation of your sinuses. Sinuses are hollow spaces in the bones around your face. This condition is caused by nasal tissues that become inflamed or swollen. The swelling traps or blocks the flow of mucus. This allows bacteria, viruses, and fungi to grow, which leads to infection. If you were prescribed an antibiotic medicine, take it as told by your health care provider. Do not stop taking the antibiotic even if you start to feel better. Keep all follow-up visits as told by your health care provider. This is important. This information is not intended to replace advice given to you by your health care provider. Make sure you discuss any questions you have with your health care provider. Document Revised: 05/20/2017 Document Reviewed: 05/20/2017 Elsevier Patient Education  2022 [employer].    If you have been instructed to have an in-person evaluation today at a local Urgent Care facility, please use the link below. It will take you to a list of all of our available [facility], including address, phone number and hours of operation. Please do not delay care.  [facility]  If you or a family member do not have a primary care provider, use the link below to schedule a visit and establish care. When you choose a [facility] primary care physician or advanced practice provider, you gain a long-term partner in health. Find a Primary Care Provider  Learn more about [facility] in-office and virtual care options: [facility] - Get Care  Now

## 2021-02-21 ENCOUNTER — Encounter: Payer: Self-pay | Admitting: Physician Assistant

## 2021-02-22 ENCOUNTER — Ambulatory Visit (INDEPENDENT_AMBULATORY_CARE_PROVIDER_SITE_OTHER): Payer: Medicare HMO | Admitting: Family Medicine

## 2021-02-22 ENCOUNTER — Encounter (INDEPENDENT_AMBULATORY_CARE_PROVIDER_SITE_OTHER): Payer: Self-pay | Admitting: Family Medicine

## 2021-02-22 ENCOUNTER — Other Ambulatory Visit: Payer: Self-pay

## 2021-02-22 VITALS — BP 100/67 | HR 79 | Temp 98.6°F | Ht 68.0 in | Wt 288.0 lb

## 2021-02-22 DIAGNOSIS — F4310 Post-traumatic stress disorder, unspecified: Secondary | ICD-10-CM | POA: Diagnosis not present

## 2021-02-22 DIAGNOSIS — R0602 Shortness of breath: Secondary | ICD-10-CM | POA: Diagnosis not present

## 2021-02-22 DIAGNOSIS — Z1331 Encounter for screening for depression: Secondary | ICD-10-CM | POA: Diagnosis not present

## 2021-02-22 DIAGNOSIS — F39 Unspecified mood [affective] disorder: Secondary | ICD-10-CM

## 2021-02-22 DIAGNOSIS — R5383 Other fatigue: Secondary | ICD-10-CM

## 2021-02-22 DIAGNOSIS — Z6841 Body Mass Index (BMI) 40.0 and over, adult: Secondary | ICD-10-CM

## 2021-02-22 DIAGNOSIS — I10 Essential (primary) hypertension: Secondary | ICD-10-CM | POA: Diagnosis not present

## 2021-02-22 DIAGNOSIS — R7303 Prediabetes: Secondary | ICD-10-CM

## 2021-02-22 DIAGNOSIS — E668 Other obesity: Secondary | ICD-10-CM

## 2021-02-22 DIAGNOSIS — R69 Illness, unspecified: Secondary | ICD-10-CM | POA: Diagnosis not present

## 2021-02-22 DIAGNOSIS — E7849 Other hyperlipidemia: Secondary | ICD-10-CM | POA: Diagnosis not present

## 2021-02-23 LAB — INSULIN, RANDOM: INSULIN: 18.1 u[IU]/mL (ref 2.6–24.9)

## 2021-02-23 LAB — VITAMIN D 25 HYDROXY (VIT D DEFICIENCY, FRACTURES): Vit D, 25-Hydroxy: 27.6 ng/mL — ABNORMAL LOW (ref 30.0–100.0)

## 2021-02-23 LAB — TSH: TSH: 1.67 u[IU]/mL (ref 0.450–4.500)

## 2021-02-23 LAB — VITAMIN B12: Vitamin B-12: 654 pg/mL (ref 232–1245)

## 2021-02-23 LAB — T4, FREE: Free T4: 1.51 ng/dL (ref 0.82–1.77)
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
## 2021-02-27 ENCOUNTER — Ambulatory Visit (INDEPENDENT_AMBULATORY_CARE_PROVIDER_SITE_OTHER): Payer: Medicare HMO | Admitting: Medical-Surgical

## 2021-02-27 DIAGNOSIS — Z0000 Encounter for general adult medical examination without abnormal findings: Secondary | ICD-10-CM | POA: Diagnosis not present

## 2021-02-27 NOTE — Progress Notes (Signed)
MEDICARE ANNUAL WELLNESS VISIT  02/27/2021  Telephone Visit Disclaimer This Medicare AWV was conducted by telephone due to national recommendations for restrictions regarding the COVID-19 Pandemic (e.g. social distancing).  I verified, using two identifiers, that I am speaking with Gary Sullivan or their authorized healthcare agent. I discussed the limitations, risks, security, and privacy concerns of performing an evaluation and management service by telephone and the potential availability of an in-person appointment in the future. The patient expressed understanding and agreed to proceed.  Location of Patient: Home Location of Provider (nurse):  In the office.  Subjective:    Gary Sullivan is a 46 y.o. adult patient of Samuel Bouche, NP who had a Medicare Annual Wellness Visit today via telephone. Gary Sullivan is Disabled and lives with their family. she has 1 child. she reports that she is socially active and does interact with friends/family regularly. she is minimally physically active and enjoys playing video games online and doing artwork..  Patient Care Team: Samuel Bouche, NP as PCP - General (Nurse Practitioner) Darius Bump, [facility] as Pharmacist (Pharmacist)  Advanced Directives 02/27/2021 09/20/2020 03/09/2020 01/11/2020 08/29/2019 08/16/2019 08/16/2019  Does Patient Have a Medical Advance Directive? No No No No No No No  Would patient like information on creating a medical advance directive? No - Patient declined No - Patient declined No - Patient declined No - Patient declined No - Patient declined - No - Patient declined    Hospital Utilization Over the Past 12 Months: # of hospitalizations or ER visits: 1 # of surgeries: 0  Review of Systems    Patient reports that her overall health is unchanged compared to last year.  History obtained from chart review and the patient  Patient Reported Readings (BP, Pulse, CBG, Weight, etc) none  Pain Assessment Pain : No/denies pain      Current Medications & Allergies (verified) Allergies as of 02/27/2021       Reactions   Molds & Smuts Cough, Hives, Itching, Shortness Of Breath   Dog Epithelium Allergy Skin Test Itching, Other (See Comments)   Other reaction(s): Other (See Comments)   Sulfamethoxazole-trimethoprim Other (See Comments), Diarrhea, Nausea Only, Rash   Other reaction(s): Abdominal Pain   Grass Pollen([id number] Vern)    Sulfa Antibiotics    Pain, upset stomach, vomiting   Cat Hair Extract Hives, Itching, Rash        Medication List        Accurate as of February 27, 2021 10:13 AM. If you have any questions, ask your nurse or doctor.          atenolol 50 MG tablet Commonly known as: TENORMIN TAKE 1 TABLET(50 MG) BY MOUTH DAILY   atorvastatin 10 MG tablet Commonly known as: LIPITOR Take 1 tablet (10 mg total) by mouth daily.   buPROPion HCl ER (XL) 450 MG Tb24 Take 1 tablet by mouth daily.   cholestyramine 4 g packet Commonly known as: QUESTRAN Take 1 packet (4 g total) by mouth daily.   cyclobenzaprine 10 MG tablet Commonly known as: FLEXERIL Take 1 tablet (10 mg total) by mouth 3 (three) times daily as needed for muscle spasms. MILD/MODERATE SPASM   Depo-Estradiol 5 MG/ML injection Generic drug: estradiol cypionate INJECT 0.8MLS UNDER THE SKIN ONCE WEEKLY   dexlansoprazole 60 MG capsule Commonly known as: DEXILANT TAKE 1 CAPSULE(60 MG) BY MOUTH DAILY   dicyclomine 20 MG tablet Commonly known as: BENTYL Take 1 tablet (20 mg total) by mouth every 6 (six) hours  as needed for spasms.   doxycycline 100 MG tablet Commonly known as: VIBRA-TABS Take 1 tablet (100 mg total) by mouth 2 (two) times daily.   doxycycline 100 MG capsule Commonly known as: VIBRAMYCIN Take 100 mg by mouth 2 (two) times daily.   DULoxetine 60 MG capsule Commonly known as: CYMBALTA Take 1 capsule (60 mg total) by mouth daily.   fenofibrate 160 MG tablet Take 1 tablet by mouth daily.    fluticasone 50 MCG/ACT nasal spray Commonly known as: FLONASE Shake liquid and instill 2 sprays in each nostril daily.   glycopyrrolate 1 MG tablet Commonly known as: ROBINUL Take 2 tablets (2 mg total) by mouth 2 (two) times daily.   lisinopril 5 MG tablet Commonly known as: ZESTRIL Take 1 tablet (5 mg total) by mouth daily.   metoCLOPramide 10 MG tablet Commonly known as: REGLAN Take 1 tablet (10 mg total) by mouth every 6 (six) hours as needed (headache).   Omega-3 1000 MG Caps Take by mouth.   predniSONE 20 MG tablet Commonly known as: DELTASONE Take 1 tablet (20 mg total) by mouth daily with breakfast.   primidone 50 MG tablet Commonly known as: MYSOLINE TAKE 1 TABLET(50 MG) BY MOUTH TWICE DAILY   Semaglutide(0.25 or 0.5MG /DOS) 2 MG/1.5ML Sopn Inject 0.5 mg into the skin once a week.   spironolactone 50 MG tablet Commonly known as: ALDACTONE Take 1 tablet (50 mg total) by mouth 2 (two) times daily.   topiramate 50 MG tablet Commonly known as: TOPAMAX TAKE 1 TO 2 TABLETS BY MOUTH EVERY NIGHT AT BEDTIME. (Future refill requests should go to PCP or pt should schedule F/U)   VITAMIN D3 PO Take 50,000 Units by mouth once a week.        History (reviewed): Past Medical History:  Diagnosis Date   ADHD    Alcohol abuse    Allergy    Anxiety    Asthma    Back pain    CFS (chronic fatigue syndrome)    Constipation    Depression    Diabetes mellitus without complication (HCC)    Drug use    Elevated heart rate with elevated blood pressure and diagnosis of hypertension    Environmental allergies    Fatty liver    Fibromyalgia    Fibromyalgia    Gallbladder problem    Gender dysphoria    GERD (gastroesophageal reflux disease)    Has daytime drowsiness    High cholesterol    History of prediabetes 06/04/2019   Hyperhidrosis    Hypertension    IBS (irritable bowel syndrome)    Pre-diabetes    Sleep apnea    Social anxiety disorder    Swallowing  difficulty    Tachycardia    Transgender 04/21/2019   Tremor of both hands    Past Surgical History:  Procedure Laterality Date   CARPAL TUNNEL RELEASE Right 10/08/2018   Procedure: RIGHT CARPAL TUNNEL RELEASE;  Surgeon: Leandrew Koyanagi, MD;  Location: [facility];  Service: Orthopedics;  Laterality: Right;   CHOLECYSTECTOMY     COLONOSCOPY     Around 2014. [place]     Aound 2014 [place]   labrum repair     NASAL SEPTUM SURGERY     SHOULDER ARTHROSCOPY WITH SUBACROMIAL DECOMPRESSION Left    TONSILLECTOMY     Family History  Problem Relation Age of Onset   High blood pressure Mother  High blood pressure Father    Irritable bowel syndrome Father    Irritable bowel syndrome Sister    Irritable bowel syndrome Sister    High blood pressure Brother    Colon cancer Maternal Aunt    Testicular cancer Paternal Uncle    Pancreatic cancer Maternal Grandfather    Colon cancer Maternal Grandfather    Lung cancer Paternal Grandmother    Diabetes Paternal Grandfather    Esophageal cancer Neg Hx    Social History   Socioeconomic History   Marital status: Divorced    Spouse name: Not on file   Number of children: 1   Years of education: 16   Highest education level: Bachelor's degree (e.g., BA, AB, BS)  Occupational History   Occupation: Unemployed   Occupation: Disabled  Tobacco Use   Smoking status: Former    Types: Cigarettes   Smokeless tobacco: Never  Vaping Use   Vaping Use: Former   Quit date: 10/31/2020  Substance and Sexual Activity   Alcohol use: Not Currently   Drug use: Not Currently   Sexual activity: Not Currently    Partners: Female  Other Topics Concern   Not on file  Social History Narrative   Lives with brother, nephew and son. She enjoys playing video games online and doing artwork.   Social Determinants of Health   Financial Resource Strain: Low Risk     Difficulty of Paying Living Expenses: Not hard at all  Food Insecurity: No Food Insecurity   Worried About Charity fundraiser in the Last Year: Never true   [place] in the Last Year: Never true  Transportation Needs: No Transportation Needs   Lack of Transportation (Medical): No   Lack of Transportation (Non-Medical): No  Physical Activity: Inactive   Days of Exercise per Week: 0 days   Minutes of Exercise per Session: 0 min  Stress: No Stress Concern Present   Feeling of Stress : Not at all  Social Connections: Socially Isolated   Frequency of Communication with Friends and Family: More than three times a week   Frequency of Social Gatherings with Friends and Family: More than three times a week   Attends Religious Services: Never   Marine scientist or Organizations: No   Attends Archivist Meetings: Never   Marital Status: Divorced    Activities of Daily Living In your present state of health, do you have any difficulty performing the following activities: 02/27/2021  Hearing? N  Vision? N  Difficulty concentrating or making decisions? Y  Comment due to untreated ADHD  Walking or climbing stairs? N  Dressing or bathing? Y  Comment has pain with arms and shoulders.  Doing errands, shopping? N  Preparing Food and eating ? N  Using the Toilet? N  In the past six months, have you accidently leaked urine? N  Do you have problems with loss of bowel control? N  Managing your Medications? N  Managing your Finances? N  Housekeeping or managing your Housekeeping? N  Some recent data might be hidden    Patient Education/ Literacy How often do you need to have someone help you when you read instructions, pamphlets, or other written materials from your doctor or pharmacy?: 1 - Never What is the last grade level you completed in school?: Bachelor's degree  Exercise Current Exercise Habits: The patient does not participate in regular exercise at present,  Exercise limited by: orthopedic condition(s)  Diet Patient reports consuming  3 meals a day and 3 snack(s) a day Patient reports that her primary diet is: Regular Patient reports that she does have regular access to food.   Depression Screen PHQ 2/9 Scores 02/27/2021 02/22/2021 01/10/2021 01/11/2020 04/21/2019  PHQ - 2 Score 4 6 5 3 4   PHQ- 9 Score 17 22 14 16 18      Fall Risk Fall Risk  02/27/2021 01/10/2021 01/11/2020  Falls in the past year? 0 0 1  Number falls in past yr: 0 0 1  Injury with Fall? 0 0 0  Risk for fall due to : No Fall Risks No Fall Risks History of fall(s)  Follow up Falls evaluation completed Falls evaluation completed Falls evaluation completed;Education provided;Falls prevention discussed     Objective:  Gary Sullivan seemed alert and oriented and she participated appropriately during our telephone visit.  Blood Pressure Weight BMI  BP Readings from Last 3 Encounters:  02/22/21 100/67  01/10/21 90/61  09/21/20 100/78   Wt Readings from Last 3 Encounters:  02/22/21 288 lb (130.6 kg)  01/10/21 287 lb (130.2 kg)  09/20/20 280 lb (127 kg)   BMI Readings from Last 1 Encounters:  02/22/21 43.79 kg/m    *Unable to obtain current vital signs, weight, and BMI due to telephone visit type  Hearing/Vision  Gary Sullivan did not seem to have difficulty with hearing/understanding during the telephone conversation Reports that she has not had a formal eye exam by an eye care professional within the past year Reports that she has not had a formal hearing evaluation within the past year *Unable to fully assess hearing and vision during telephone visit type  Cognitive Function: 6CIT Screen 02/27/2021 01/11/2020  What Year? 0 points 0 points  What month? 0 points 0 points  What time? 0 points 0 points  Count back from 20 0 points 0 points  Months in reverse 0 points 0 points  Repeat phrase 6 points 0 points  Total Score 6 0   (Normal:0-7, Significant for Dysfunction:  >8)  Normal Cognitive Function Screening: Yes   Immunization & Health Maintenance Record Immunization History  Administered Date(s) Administered   Moderna Sars-Covid-2 Vaccination 08/11/2019, 09/05/2019    Health Maintenance  Topic Date Due   COVID-19 Vaccine (3 - Booster for Moderna series) 03/15/2021 (Originally 10/31/2019)   INFLUENZA VACCINE  03/31/2021 (Originally 08/01/2020)   TETANUS/TDAP  01/10/2022 (Originally 02/12/1996)   Hepatitis C Screening  02/27/2022 (Originally 02/12/1995)   HIV Screening  02/27/2022 (Originally 02/12/1992)   HPV VACCINES  Aged Out   PAP SMEAR-Modifier  Discontinued       Assessment  This is a routine wellness examination for Gary Sullivan.  Health Maintenance: Due or Overdue There are no preventive care reminders to display for this patient.   Gary Sullivan does not need a referral for Community Assistance: Care Management:   no Social Work:    no Prescription Assistance:  no Nutrition/Diabetes Education:  no   Plan:  Personalized Goals  Goals Addressed               This Visit's Progress     Patient Stated (pt-stated)        Would like to loose 20 lbs.       Personalized Health Maintenance & Screening Recommendations  Influenza vaccine Td vaccine  Lung Cancer Screening Recommended: no (Low Dose CT Chest recommended if Age 20-80 years, 30 pack-year currently smoking OR have quit w/in past 15 years) Hepatitis C Screening recommended: yes HIV  Screening recommended: yes  Advanced Directives: Written information was not prepared per patient's request.  Referrals & Orders No orders of the defined types were placed in this encounter.   Follow-up Plan Follow-up with Samuel Bouche, NP as planned Flu vaccine can be done at the office or at pharmacy.  Tetanus shot can be done at the pharmacy. Medicare wellness visit in one year. Patient will have access AVS on mychart.   I have personally reviewed and noted the following  in the patients chart:   Medical and social history Use of alcohol, tobacco or illicit drugs  Current medications and supplements Functional ability and status Nutritional status Physical activity Advanced directives List of other physicians Hospitalizations, surgeries, and ER visits in previous 12 months Vitals Screenings to include cognitive, depression, and falls Referrals and appointments  In addition, I have reviewed and discussed with Gary Sullivan certain preventive protocols, quality metrics, and best practice recommendations. A written personalized care plan for preventive services as well as general preventive health recommendations is available and can be mailed to the patient at her request.      Gary Gens, RN  02/27/2021

## 2021-02-27 NOTE — Patient Instructions (Addendum)
MEDICARE ANNUAL WELLNESS VISIT Health Maintenance Summary and Written Plan of Care  [name] ,  Thank you for allowing me to perform your Medicare Annual Wellness Visit and for your ongoing commitment to your health.   Health Maintenance & Immunization History Health Maintenance  Topic Date Due   COVID-19 Vaccine (3 - Booster for Moderna series) 03/15/2021 (Originally 10/31/2019)   INFLUENZA VACCINE  03/31/2021 (Originally 08/01/2020)   TETANUS/TDAP  01/10/2022 (Originally 02/12/1996)   Hepatitis C Screening  02/27/2022 (Originally 02/12/1995)   HIV Screening  02/27/2022 (Originally 02/12/1992)   HPV VACCINES  Aged Out   PAP SMEAR-Modifier  Discontinued   Immunization History  Administered Date(s) Administered   Moderna Sars-Covid-2 Vaccination 08/11/2019, 09/05/2019    These are the patient goals that we discussed:  Goals Addressed               This Visit's Progress     Patient Stated (pt-stated)        Would like to loose 20 lbs.         This is a list of Health Maintenance Items that are overdue or due now: nfluenza vaccine Td vaccine  Orders/Referrals Placed Today: No orders of the defined types were placed in this encounter.  (Contact our referral department at [phone] if you have not spoken with someone about your referral appointment within the next 5 days)    Follow-up with Christen Butter, NP as planned Flu vaccine can be done at the office or at pharmacy.  Tetanus shot can be done at the pharmacy. Medicare wellness visit in one year. Patient will have access AVS on mychart.      Health Maintenance, Male Adopting a healthy lifestyle and getting preventive care are important in promoting health and wellness. Ask your health care provider about: The right schedule for you to have regular tests and exams. Things you can do on your own to prevent diseases and keep yourself healthy. What should I know about diet, weight, and exercise? Eat a  healthy diet  Eat a diet that includes plenty of vegetables, fruits, low-fat dairy products, and lean protein. Do not eat a lot of foods that are high in solid fats, added sugars, or sodium. Maintain a healthy weight Body mass index (BMI) is used to identify weight problems. It estimates body fat based on height and weight. Your health care provider can help determine your BMI and help you achieve or maintain a healthy weight. Get regular exercise Get regular exercise. This is one of the most important things you can do for your health. Most adults should: Exercise for at least 150 minutes each week. The exercise should increase your heart rate and make you sweat (moderate-intensity exercise). Do strengthening exercises at least twice a week. This is in addition to the moderate-intensity exercise. Spend less time sitting. Even light physical activity can be beneficial. Watch cholesterol and blood lipids Have your blood tested for lipids and cholesterol at 44 years of age, then have this test every 5 years. Have your cholesterol levels checked more often if: Your lipid or cholesterol levels are high. You are older than 44 years of age. You are at high risk for heart disease. What should I know about cancer screening? Depending on your health history and family history, you may need to have cancer screening at various ages. This may include screening for: Breast cancer. Cervical cancer. Colorectal cancer. Skin cancer. Lung cancer. What should I know about heart disease, diabetes, and high  blood pressure? Blood pressure and heart disease High blood pressure causes heart disease and increases the risk of stroke. This is more likely to develop in people who have high blood pressure readings or are overweight. Have your blood pressure checked: Every 3-5 years if you are 65-16 years of age. Every year if you are 61 years old or older. Diabetes Have regular diabetes screenings. This checks  your fasting blood sugar level. Have the screening done: Once every three years after age 61 if you are at a normal weight and have a low risk for diabetes. More often and at a younger age if you are overweight or have a high risk for diabetes. What should I know about preventing infection? Hepatitis B If you have a higher risk for hepatitis B, you should be screened for this virus. Talk with your health care provider to find out if you are at risk for hepatitis B infection. Hepatitis C Testing is recommended for: Everyone born from [birth year] through 1963. Anyone with known risk factors for hepatitis C. Sexually transmitted infections (STIs) Get screened for STIs, including gonorrhea and chlamydia, if: You are sexually active and are younger than 44 years of age. You are older than 45 years of age and your health care provider tells you that you are at risk for this type of infection. Your sexual activity has changed since you were last screened, and you are at increased risk for chlamydia or gonorrhea. Ask your health care provider if you are at risk. Ask your health care provider about whether you are at high risk for HIV. Your health care provider may recommend a prescription medicine to help prevent HIV infection. If you choose to take medicine to prevent HIV, you should first get tested for HIV. You should then be tested every 3 months for as long as you are taking the medicine. Pregnancy If you are about to stop having your period (premenopausal) and you may become pregnant, seek counseling before you get pregnant. Take 400 to 800 micrograms (mcg) of folic acid every day if you become pregnant. Ask for birth control (contraception) if you want to prevent pregnancy. Osteoporosis and menopause Osteoporosis is a disease in which the bones lose minerals and strength with aging. This can result in bone fractures. If you are 2 years old or older, or if you are at risk for osteoporosis and fractures,  ask your health care provider if you should: Be screened for bone loss. Take a calcium or vitamin D supplement to lower your risk of fractures. Be given hormone replacement therapy (HRT) to treat symptoms of menopause. Follow these instructions at home: Alcohol use Do not drink alcohol if: Your health care provider tells you not to drink. You are pregnant, may be pregnant, or are planning to become pregnant. If you drink alcohol: Limit how much you have to: 0-1 drink a day. Know how much alcohol is in your drink. In the [place] one drink equals one 12 oz bottle of beer (355 mL), one 5 oz glass of wine (148 mL), or one 1 oz glass of hard liquor (44 mL). Lifestyle Do not use any products that contain nicotine or tobacco. These products include cigarettes, chewing tobacco, and vaping devices, such as e-cigarettes. If you need help quitting, ask your health care provider. Do not use street drugs. Do not share needles. Ask your health care provider for help if you need support or information about quitting drugs. General instructions Schedule regular health, dental, and  eye exams. Stay current with your vaccines. Tell your health care provider if: You often feel depressed. You have ever been abused or do not feel safe at home. Summary Adopting a healthy lifestyle and getting preventive care are important in promoting health and wellness. Follow your health care provider's instructions about healthy diet, exercising, and getting tested or screened for diseases. Follow your health care provider's instructions on monitoring your cholesterol and blood pressure. This information is not intended to replace advice given to you by your health care provider. Make sure you discuss any questions you have with your health care provider. Document Revised: 05/09/2020 Document Reviewed: 05/09/2020 Elsevier Patient Education  2022 [employer].

## 2021-03-02 ENCOUNTER — Telehealth: Payer: Self-pay

## 2021-03-02 NOTE — Telephone Encounter (Signed)
Initiated Prior authorization [id number] HCL ER (XL) Tablet ER 24HR ?Via: Covermymeds ?Case/Key:[id number] - [place] Case ID: [id number] ?Status: approved as of 03/02/21 ?Reason: ?Notified Pt via: Mychart ?

## 2021-03-06 ENCOUNTER — Ambulatory Visit (INDEPENDENT_AMBULATORY_CARE_PROVIDER_SITE_OTHER): Payer: Medicare HMO | Admitting: Family Medicine

## 2021-03-06 DIAGNOSIS — R69 Illness, unspecified: Secondary | ICD-10-CM | POA: Diagnosis not present

## 2021-03-06 DIAGNOSIS — F4310 Post-traumatic stress disorder, unspecified: Secondary | ICD-10-CM | POA: Diagnosis not present

## 2021-03-07 ENCOUNTER — Other Ambulatory Visit: Payer: Self-pay | Admitting: Medical-Surgical

## 2021-03-07 DIAGNOSIS — Z789 Other specified health status: Secondary | ICD-10-CM

## 2021-03-08 ENCOUNTER — Ambulatory Visit (INDEPENDENT_AMBULATORY_CARE_PROVIDER_SITE_OTHER): Payer: Medicare HMO | Admitting: Family Medicine

## 2021-03-08 ENCOUNTER — Encounter (INDEPENDENT_AMBULATORY_CARE_PROVIDER_SITE_OTHER): Payer: Self-pay | Admitting: Family Medicine

## 2021-03-08 ENCOUNTER — Other Ambulatory Visit: Payer: Self-pay

## 2021-03-08 VITALS — BP 95/62 | HR 71 | Temp 97.9°F | Ht 68.0 in | Wt 282.0 lb

## 2021-03-08 DIAGNOSIS — E559 Vitamin D deficiency, unspecified: Secondary | ICD-10-CM

## 2021-03-08 DIAGNOSIS — I10 Essential (primary) hypertension: Secondary | ICD-10-CM | POA: Diagnosis not present

## 2021-03-08 DIAGNOSIS — R7303 Prediabetes: Secondary | ICD-10-CM | POA: Diagnosis not present

## 2021-03-08 DIAGNOSIS — E669 Obesity, unspecified: Secondary | ICD-10-CM | POA: Diagnosis not present

## 2021-03-08 DIAGNOSIS — Z6841 Body Mass Index (BMI) 40.0 and over, adult: Secondary | ICD-10-CM | POA: Diagnosis not present

## 2021-03-08 DIAGNOSIS — E7849 Other hyperlipidemia: Secondary | ICD-10-CM | POA: Diagnosis not present

## 2021-03-08 MED ORDER — VITAMIN D (ERGOCALCIFEROL) 1.25 MG (50000 UNIT) PO CAPS: ORAL_CAPSULE | ORAL | 0 refills | Status: DC

## 2021-03-09 NOTE — Progress Notes (Signed)
Chief Complaint:   OBESITY Gary Sullivan is here to discuss her progress with her obesity treatment plan along with follow-up of her obesity related diagnoses. Gary Sullivan is on the Category 4 Plan and states she is following her eating plan approximately 90-95% of the time. Gary Sullivan states she is not exercising at this time.  Today's visit was #: 2 Starting weight: 288 lbs Starting date: 02/22/2021 Today's weight: 282 lbs Today's date: 03/08/2021 Total lbs lost to date: 6 lbs Total lbs lost since last in-office visit: 6 lbs  Interim History: Gary Sullivan is here today for her first follow-up office visit since starting the program with [place].  All blood work/ lab tests that were recently ordered by myself or an outside provider were reviewed with patient today per their request.   Extended time was spent counseling her on all new disease processes that were discovered or preexisting ones that are affected by BMI.  she understands that many of these abnormalities will need to monitored regularly along with the current treatment plan of prudent dietary changes, in which we are making each and every office visit, to improve these health parameters.  Gary Sullivan is here for a follow up office visit.  We reviewed her meal plan and questions were answered.  Patient's food recall appears to be accurate and consistent with what is on plan when she is following it.   When eating on plan, her hunger and cravings are well controlled.  Still with occasional emotional cravings, but denies need for medications.  Weighing proteins and measuring foods.  Doing great with her weight loss.  Subjective:   1. Essential hypertension Asymptomatic.  Denies dizziness.  Her BP runs low on a regular basis.  She is taking Tenormin, Zestril, and Aldactone.    2. Other hyperlipidemia Discussed labs with patient today.  Gary Sullivan was recently placed on Lipitor by her PCP.  LDL not at goal at all because she just started medication.  She is taking  Lipitor, fenofibrate, and Omega-3.  Her A1c is normal and fasting insulin is elevated at 18.1.  3. Prediabetes Discussed labs with patient today.  Gary Sullivan has not been taking Ozempic for the past 3 months.  She was previously on it for 1.5 years at 0.5 mg weekly.    4. Vitamin D deficiency New.  Discussed labs with patient today.  Krysti endorses fatigue and tiredness.  Assessment/Plan:  No orders of the defined types were placed in this encounter.   Medications Discontinued During This Encounter  Medication Reason   doxycycline (VIBRAMYCIN) 100 MG capsule    doxycycline (VIBRA-TABS) 100 MG tablet    predniSONE (DELTASONE) 20 MG tablet      Meds ordered this encounter  Medications   Vitamin D, Ergocalciferol, (DRISDOL) 1.25 MG (50000 UNIT) CAPS capsule    Sig: Take one tablet wkly    Dispense:  4 capsule    Refill:  0    30 d supply;  ** OV for RF **   Do not send RF request     1. Essential hypertension Blood pressure is at goal.  Gary Sullivan is working on healthy weight loss and exercise to improve blood pressure control. We will watch for signs of hypotension as she continues her lifestyle modifications.  2. Other hyperlipidemia Will recheck labs in 3 months (FLP and CMP) after taking Lipitor.  Cardiovascular risk and specific lipid/LDL goals reviewed.  We discussed several lifestyle modifications today and Gary Sullivan will continue to work on diet, exercise  and weight loss efforts. Orders and follow up as documented in patient record.   Counseling Intensive lifestyle modifications are the first line treatment for this issue. Dietary changes: Increase soluble fiber. Decrease simple carbohydrates. Exercise changes: Moderate to vigorous-intensity aerobic activity 150 minutes per week if tolerated. Lipid-lowering medications: see documented in medical record.  3. Prediabetes Recheck labs at the end of April.  No cravings or hunger, thus advised her to continue prudent nutritional plan  and decrease simple carbs to achieve weight loss.  4. Vitamin D deficiency - I discussed the importance of vitamin D to the patient's health and well-being.  - I reviewed possible symptoms of low Vitamin D:  low energy, depressed mood, muscle aches, joint aches, osteoporosis etc. was reviewed with patient - low Vitamin D levels may be linked to an increased risk of cardiovascular events and even increased risk of cancers- such as colon and breast.  - ideal vitamin D levels reviewed with patient  - I recommend pt take once weekly prescription vit D - see script below   - Informed patient this may be a lifelong thing, and she was encouraged to continue to take the medicine until told otherwise.    - weight loss will likely improve availability of vitamin D, thus encouraged Krysti to continue with meal plan and their weight loss efforts to further improve this condition.  Thus, we will need to monitor levels regularly (every 3-4 mo on average) to keep levels within normal limits and prevent over supplementation. - pt's questions and concerns regarding this condition addressed.  - Start Vitamin D, Ergocalciferol, (DRISDOL) 1.25 MG (50000 UNIT) CAPS capsule; Take one tablet wkly  Dispense: 4 capsule; Refill: 0  5. Obesity with current BMI of 42.9  Gary Sullivan is currently in the action stage of change. As such, her goal is to continue with weight loss efforts. She has agreed to the Category 4 Plan.   Eating Out Guide given to patient.  Exercise goals:  As is.  Behavioral modification strategies: increasing lean protein intake, decreasing simple carbohydrates, better snacking choices, and planning for success.  Gary Sullivan has agreed to follow-up with our clinic in 2-3 weeks. She was informed of the importance of frequent follow-up visits to maximize her success with intensive lifestyle modifications for her multiple health conditions.   Objective:   Blood pressure 95/62, pulse 71, temperature 97.9 F  (36.6 C), temperature source Oral, height 5\' 8"  (1.727 m), weight 282 lb (127.9 kg), SpO2 96 %. Body mass index is 42.88 kg/m.  General: Cooperative, alert, well developed, in no acute distress. HEENT: Conjunctivae and lids unremarkable. Cardiovascular: Regular rhythm.  Lungs: Normal work of breathing. Neurologic: No focal deficits.   Lab Results  Component Value Date   CREATININE 1.42 (H) 01/26/2021   BUN 16 01/26/2021   NA 136 01/26/2021   K 4.3 01/26/2021   CL 101 01/26/2021   CO2 27 01/26/2021   Lab Results  Component Value Date   ALT 13 01/26/2021   AST 19 01/26/2021   BILITOT 0.5 01/26/2021   Lab Results  Component Value Date   HGBA1C 5.6 01/26/2021   HGBA1C 5.2 07/11/2020   HGBA1C 5.4 04/22/2019   Lab Results  Component Value Date   INSULIN 18.1 02/22/2021   Lab Results  Component Value Date   TSH 1.670 02/22/2021   Lab Results  Component Value Date   CHOL 208 (H) 01/26/2021   HDL 29 (L) 01/26/2021   LDLCALC 137 (H) 01/26/2021  TRIG 274 (H) 01/26/2021   CHOLHDL 7.2 (H) 01/26/2021   Lab Results  Component Value Date   VD25OH 27.6 (L) 02/22/2021   Lab Results  Component Value Date   WBC 8.1 01/26/2021   HGB 12.7 (L) 01/26/2021   HCT 38.1 (L) 01/26/2021   MCV 92.5 01/26/2021   PLT 425 (H) 01/26/2021   Obesity Behavioral Intervention:   Approximately 15 minutes were spent on the discussion below.  ASK: We discussed the diagnosis of obesity with Gary Sullivan today and Gary Sullivan agreed to give [place] permission to discuss obesity behavioral modification therapy today.  ASSESS: Gary Sullivan has the diagnosis of obesity and her BMI today is 42.9. Gary Sullivan is in the action stage of change.   ADVISE: Gary Sullivan was educated on the multiple health risks of obesity as well as the benefit of weight loss to improve her health. She was advised of the need for long term treatment and the importance of lifestyle modifications to improve her current health and to decrease her risk  of future health problems.  AGREE: Multiple dietary modification options and treatment options were discussed and Krysti agreed to follow the recommendations documented in the above note.  ARRANGE: Gary Sullivan was educated on the importance of frequent visits to treat obesity as outlined per CMS and USPSTF guidelines and agreed to schedule her next follow up appointment today.  Attestation Statements:   Reviewed by clinician on day of visit: allergies, medications, problem list, medical history, surgical history, family history, social history, and previous encounter notes.  I, Water quality scientist, CMA, am acting as Location manager for [employer], DO.  I have reviewed the above documentation for accuracy and completeness, and I agree with the above. Marjory Sneddon, D.O.  The [place] was signed into law in 2016 which includes the topic of electronic health records.  This provides immediate access to information in MyChart.  This includes consultation notes, operative notes, office notes, lab results and pathology reports.  If you have any questions about what you read please let us know at your next visit so we can discuss your concerns and take corrective action if need be.  We are right here with you.

## 2021-03-14 ENCOUNTER — Other Ambulatory Visit: Payer: Self-pay

## 2021-03-14 MED ORDER — SEMAGLUTIDE(0.25 OR 0.5MG/DOS) 2 MG/1.5ML ~~LOC~~ SOPN: 0.5000 mg | PEN_INJECTOR | SUBCUTANEOUS | 1 refills | Status: DC

## 2021-03-16 ENCOUNTER — Telehealth: Payer: Medicare HMO | Admitting: Physician Assistant

## 2021-03-16 DIAGNOSIS — K0889 Other specified disorders of teeth and supporting structures: Secondary | ICD-10-CM

## 2021-03-16 MED ORDER — IBUPROFEN 800 MG PO TABS: 800.0000 mg | ORAL_TABLET | Freq: Three times a day (TID) | ORAL | 0 refills | Status: AC | PRN

## 2021-03-16 NOTE — Progress Notes (Signed)
?Virtual Visit Consent  ? ?[name], you are scheduled for a virtual visit with a [facility] provider today.   ?  ?Just as with appointments in the office, your consent must be obtained to participate.  Your consent will be active for this visit and any virtual visit you may have with one of our providers in the next 365 days.   ?  ?If you have a MyChart account, a copy of this consent can be sent to you electronically.  All virtual visits are billed to your insurance company just like a traditional visit in the office.   ? ?As this is a virtual visit, video technology does not allow for your provider to perform a traditional examination.  This may limit your provider's ability to fully assess your condition.  If your provider identifies any concerns that need to be evaluated in person or the need to arrange testing (such as labs, EKG, etc.), we will make arrangements to do so.   ?  ?Although advances in technology are sophisticated, we cannot ensure that it will always work on either your end or our end.  If the connection with a video visit is poor, the visit may have to be switched to a telephone visit.  With either a video or telephone visit, we are not always able to ensure that we have a secure connection.    ? ?I need to obtain your verbal consent now.   Are you willing to proceed with your visit today?  ?  ?[name] has provided verbal consent on 03/16/2021 for a virtual visit (video or telephone). ?  ?Mar Daring, PA-C  ? ?Date: 03/16/2021 7:51 PM ? ? ?Virtual Visit via Video Note  ? ?Gary Sullivan, connected with  Eastin Swing  ([id number], 23-May-1975) on 03/16/21 at  7:45 PM EDT by a video-enabled telemedicine application and verified that I am speaking with the correct person using two identifiers. ? ?Location: ?Patient: Virtual Visit Location Patient: Home ?Provider: Virtual Visit Location Provider: Home Office ?  ?I discussed the limitations of evaluation and management  by telemedicine and the availability of in person appointments. The patient expressed understanding and agreed to proceed.   ? ?History of Present Illness: ?Gary Sullivan is a 46 y.o. who identifies as a transgender male who was assigned adult at birth, and is being seen today for dental pain. ? ?HPI: Dental Pain  ?This is a new problem. The current episode started in the past 7 days. The problem occurs constantly. The problem has been gradually worsening. The pain is severe. Associated symptoms include facial pain, a fever and thermal sensitivity. Pertinent negatives include no sinus pressure. She has tried ice and rest for the symptoms. The treatment provided no relief.  Is on Amoxicillin 542m TID from dentist. Next appointment is not until 04/11/21. Having intense pain and sensitivity.  ? ? ?Problems:  ?Patient Active Problem List  ? Diagnosis Date Noted  ? Mood disorder ([place]- mixed anxiety and depression 02/22/2021  ? Pre-diabetes 08/16/2020  ? Polyarthralgia 05/10/2020  ? Chronic right shoulder pain 03/29/2020  ? Broken or cracked tooth, nontraumatic 02/09/2020  ? Poor dentition 02/09/2020  ? Tooth pain 02/09/2020  ? History of prediabetes 06/04/2019  ? Transgender 04/21/2019  ? Carpal tunnel syndrome on right 10/07/2018  ? OSA (obstructive sleep apnea) 06/19/2018  ? Excessive daytime sleepiness 06/19/2018  ? Numbness 05/28/2018  ? Tremor 05/28/2018  ? Fibromyalgia 05/28/2018  ? Depression with anxiety 05/28/2018  ?  High cholesterol 01/02/2012  ? High blood pressure 01/02/2008  ? IBS (irritable bowel syndrome) 01/01/2006  ? GERD (gastroesophageal reflux disease) 01/01/2005  ? Social anxiety disorder 01/02/1988  ? Gender dysphoria 01/01/1985  ? Asthma 01/01/1982  ?  ?Allergies:  ?Allergies  ?Allergen Reactions  ? Molds & Smuts Cough, Hives, Itching and Shortness Of Breath  ? Dog Epithelium Allergy Skin Test Itching and Other (See Comments)  ?  Other reaction(s): Other (See Comments) ?  ?  Sulfamethoxazole-Trimethoprim Other (See Comments), Diarrhea, Nausea Only and Rash  ?  Other reaction(s): Abdominal Pain ?  ? Grass Pollen(K-O-R-T-Swt Vern)   ? Sulfa Antibiotics   ?  Pain, upset stomach, vomiting  ? Cat Hair Extract Hives, Itching and Rash  ? ?Medications:  ?Current Outpatient Medications:  ?  ibuprofen (ADVIL) 800 MG tablet, Take 1 tablet (800 mg total) by mouth every 8 (eight) hours as needed., Disp: 30 tablet, Rfl: 0 ?  atenolol (TENORMIN) 50 MG tablet, TAKE 1 TABLET(50 MG) BY MOUTH DAILY, Disp: 90 tablet, Rfl: 1 ?  atorvastatin (LIPITOR) 10 MG tablet, Take 1 tablet (10 mg total) by mouth daily., Disp: 90 tablet, Rfl: 3 ?  buPROPion HCl ER, XL, 450 MG TB24, Take 1 tablet by mouth daily., Disp: 90 tablet, Rfl: 0 ?  Cholecalciferol (VITAMIN D3 PO), Take 50,000 Units by mouth once a week., Disp: , Rfl:  ?  cholestyramine (QUESTRAN) 4 g packet, Take 1 packet (4 g total) by mouth daily., Disp: 60 each, Rfl: 3 ?  cyclobenzaprine (FLEXERIL) 10 MG tablet, Take 1 tablet (10 mg total) by mouth 3 (three) times daily as needed for muscle spasms. MILD/MODERATE SPASM, Disp: 90 tablet, Rfl: 0 ?  DEPO-ESTRADIOL 5 MG/ML injection, Inject 0.8 ml under the skin once weekly., Disp: 0.8 mL, Rfl: 0 ?  dexlansoprazole (DEXILANT) 60 MG capsule, TAKE 1 CAPSULE(60 MG) BY MOUTH DAILY, Disp: 90 capsule, Rfl: 1 ?  dicyclomine (BENTYL) 20 MG tablet, Take 1 tablet (20 mg total) by mouth every 6 (six) hours as needed for spasms., Disp: 180 tablet, Rfl: 3 ?  DULoxetine (CYMBALTA) 60 MG capsule, Take 1 capsule (60 mg total) by mouth daily., Disp: 90 capsule, Rfl: 3 ?  fenofibrate 160 MG tablet, Take 1 tablet by mouth daily., Disp: 90 tablet, Rfl: 1 ?  fluticasone (FLONASE) 50 MCG/ACT nasal spray, Shake liquid and instill 2 sprays in each nostril daily., Disp: 16 g, Rfl: 3 ?  glycopyrrolate (ROBINUL) 1 MG tablet, Take 2 tablets (2 mg total) by mouth 2 (two) times daily., Disp: 180 tablet, Rfl: 3 ?  lisinopril (ZESTRIL) 5 MG  tablet, Take 1 tablet (5 mg total) by mouth daily., Disp: 90 tablet, Rfl: 0 ?  metoCLOPramide (REGLAN) 10 MG tablet, Take 1 tablet (10 mg total) by mouth every 6 (six) hours as needed (headache)., Disp: 30 tablet, Rfl: 1 ?  Omega-3 1000 MG CAPS, Take by mouth., Disp: , Rfl:  ?  primidone (MYSOLINE) 50 MG tablet, TAKE 1 TABLET(50 MG) BY MOUTH TWICE DAILY, Disp: 180 tablet, Rfl: 1 ?  Semaglutide,0.25 or 0.5MG/DOS, 2 MG/1.5ML SOPN, Inject 0.5 mg into the skin once a week., Disp: 4.5 mL, Rfl: 1 ?  spironolactone (ALDACTONE) 50 MG tablet, Take 1 tablet (50 mg total) by mouth 2 (two) times daily., Disp: 180 tablet, Rfl: 0 ?  topiramate (TOPAMAX) 50 MG tablet, TAKE 1 TO 2 TABLETS BY MOUTH EVERY NIGHT AT BEDTIME. (Future refill requests should go to PCP or pt should schedule F/U),  Disp: 30 tablet, Rfl: 0 ?  Vitamin D, Ergocalciferol, (DRISDOL) 1.25 MG (50000 UNIT) CAPS capsule, Take one tablet wkly, Disp: 4 capsule, Rfl: 0 ? ?Current Facility-Administered Medications:  ?  0.9 %  sodium chloride infusion, 500 mL, Intravenous, Once, Cirigliano, Vito V, DO ? ?Observations/Objective: ?Patient is well-developed, well-nourished in no acute distress.  ?Resting comfortably at home.  ?Head is normocephalic, atraumatic.  ?No labored breathing.  ?Speech is clear and coherent with logical content.  ?Patient is alert and oriented at baseline.  ? ? ?Assessment and Plan: ?1. Pain, dental ?- ibuprofen (ADVIL) 800 MG tablet; Take 1 tablet (800 mg total) by mouth every 8 (eight) hours as needed.  Dispense: 30 tablet; Refill: 0 ? ?- Ibuprofen added ?- May take with or alternate with tylenol ?- Continue Amoxicillin ?- May use Dentek Temparin Max advanced repair kit, Dentemp, or other dental putty ?- Room temperature foods and liquids  ?- Seek in person evaluation if pain persists or worsens ? ?Follow Up Instructions: ?I discussed the assessment and treatment plan with the patient. The patient was provided an opportunity to ask questions and  all were answered. The patient agreed with the plan and demonstrated an understanding of the instructions.  A copy of instructions were sent to the patient via MyChart unless otherwise noted below.  ? ? ?The patient was advise

## 2021-03-16 NOTE — Patient Instructions (Signed)
?Gary Sullivan, thank you for joining Gary Loveless, PA-C for today's virtual visit.  While this provider is not your primary care provider (PCP), if your PCP is located in our provider database this encounter information will be shared with them immediately following your visit. ? ?Consent: ?(Patient) Gary Sullivan provided verbal consent for this virtual visit at the beginning of the encounter. ? ?Current Medications: ? ?Current Outpatient Medications:  ?  ibuprofen (ADVIL) 800 MG tablet, Take 1 tablet (800 mg total) by mouth every 8 (eight) hours as needed., Disp: 30 tablet, Rfl: 0 ?  atenolol (TENORMIN) 50 MG tablet, TAKE 1 TABLET(50 MG) BY MOUTH DAILY, Disp: 90 tablet, Rfl: 1 ?  atorvastatin (LIPITOR) 10 MG tablet, Take 1 tablet (10 mg total) by mouth daily., Disp: 90 tablet, Rfl: 3 ?  buPROPion HCl ER, XL, 450 MG TB24, Take 1 tablet by mouth daily., Disp: 90 tablet, Rfl: 0 ?  Cholecalciferol (VITAMIN D3 PO), Take 50,000 Units by mouth once a week., Disp: , Rfl:  ?  cholestyramine (QUESTRAN) 4 g packet, Take 1 packet (4 g total) by mouth daily., Disp: 60 each, Rfl: 3 ?  cyclobenzaprine (FLEXERIL) 10 MG tablet, Take 1 tablet (10 mg total) by mouth 3 (three) times daily as needed for muscle spasms. MILD/MODERATE SPASM, Disp: 90 tablet, Rfl: 0 ?  DEPO-ESTRADIOL 5 MG/ML injection, Inject 0.8 ml under the skin once weekly., Disp: 0.8 mL, Rfl: 0 ?  dexlansoprazole (DEXILANT) 60 MG capsule, TAKE 1 CAPSULE(60 MG) BY MOUTH DAILY, Disp: 90 capsule, Rfl: 1 ?  dicyclomine (BENTYL) 20 MG tablet, Take 1 tablet (20 mg total) by mouth every 6 (six) hours as needed for spasms., Disp: 180 tablet, Rfl: 3 ?  DULoxetine (CYMBALTA) 60 MG capsule, Take 1 capsule (60 mg total) by mouth daily., Disp: 90 capsule, Rfl: 3 ?  fenofibrate 160 MG tablet, Take 1 tablet by mouth daily., Disp: 90 tablet, Rfl: 1 ?  fluticasone (FLONASE) 50 MCG/ACT nasal spray, Shake liquid and instill 2 sprays in each nostril daily., Disp: 16 g, Rfl:  3 ?  glycopyrrolate (ROBINUL) 1 MG tablet, Take 2 tablets (2 mg total) by mouth 2 (two) times daily., Disp: 180 tablet, Rfl: 3 ?  lisinopril (ZESTRIL) 5 MG tablet, Take 1 tablet (5 mg total) by mouth daily., Disp: 90 tablet, Rfl: 0 ?  metoCLOPramide (REGLAN) 10 MG tablet, Take 1 tablet (10 mg total) by mouth every 6 (six) hours as needed (headache)., Disp: 30 tablet, Rfl: 1 ?  Omega-3 1000 MG CAPS, Take by mouth., Disp: , Rfl:  ?  primidone (MYSOLINE) 50 MG tablet, TAKE 1 TABLET(50 MG) BY MOUTH TWICE DAILY, Disp: 180 tablet, Rfl: 1 ?  Semaglutide,0.25 or 0.5MG /DOS, 2 MG/1.5ML SOPN, Inject 0.5 mg into the skin once a week., Disp: 4.5 mL, Rfl: 1 ?  spironolactone (ALDACTONE) 50 MG tablet, Take 1 tablet (50 mg total) by mouth 2 (two) times daily., Disp: 180 tablet, Rfl: 0 ?  topiramate (TOPAMAX) 50 MG tablet, TAKE 1 TO 2 TABLETS BY MOUTH EVERY NIGHT AT BEDTIME. (Future refill requests should go to PCP or pt should schedule F/U), Disp: 30 tablet, Rfl: 0 ?  Vitamin D, Ergocalciferol, (DRISDOL) 1.25 MG (50000 UNIT) CAPS capsule, Take one tablet wkly, Disp: 4 capsule, Rfl: 0 ? ?Current Facility-Administered Medications:  ?  0.9 %  sodium chloride infusion, 500 mL, Intravenous, Once, Sullivan, Gary V, DO  ? ?Medications ordered in this encounter:  ?Meds ordered this encounter  ?Medications  ?  ibuprofen (ADVIL) 800 MG tablet  ?  Sig: Take 1 tablet (800 mg total) by mouth every 8 (eight) hours as needed.  ?  Dispense:  30 tablet  ?  Refill:  0  ?  Order Specific Question:   Supervising Provider  ?  Answer:   Gary Sullivan [[id number]]  ?  ? ?*If you need refills on other medications prior to your next appointment, please contact your pharmacy* ? ?Follow-Up: ?Call back or seek an in-person evaluation if the symptoms worsen or if the condition fails to improve as anticipated. ? ?Other Instructions ? ?Gary Sullivan, ?I actually found where you can get the putty from Gary Sullivan or other pharmacies called Dentek temparin max or Dentemp and can  do this yourself to avoid an UC charge. Dentek is just under $9 at [employer]. It can be ordered online as well. ? ?If needed: ? For an urgent face to face visit, [facility] has six urgent care centers for your convenience:  ?  ? [facility] ?Get Driving Directions ?[phone] ?[phone] ?[place], [place] [address] ?  ? [facility]) ?Get Driving Directions ?[phone] ?[address] ?[place], [place] [address] ? ?[facility]) ?Get Driving Directions ?[phone] ?[address] ?[place],  [place]  [address] ? ?[facility] ?Get Driving Directions ?[phone] ?[address] ?[place], [place] [address] ?  ?[facility] [place] ?Get Driving Directions  ?[phone] ?[address] ?[address] ?[place], [place] [address] ?  ?[facility] ?Get Driving Directions ?[phone] ?[address] ?[place], [place] [address] ? ? ?If you have been instructed to have an in-person evaluation today at a local Urgent Care facility, please use the link below. It will take you to a list of all of our available [facility], including address, phone number and hours of operation. Please do not delay care.  ?[facility] ? ?If you or a family member do not have a primary care provider, use the link below to schedule a visit and establish care. When you choose a [facility] primary care physician or advanced practice provider, you gain a long-term partner in health. ?Find a Primary Care Provider ? ?Learn more about [facility]'s in-office and virtual care options: ?[facility] - Get Care Now ?

## 2021-03-17 DIAGNOSIS — K0889 Other specified disorders of teeth and supporting structures: Secondary | ICD-10-CM | POA: Diagnosis not present

## 2021-03-23 DIAGNOSIS — R69 Illness, unspecified: Secondary | ICD-10-CM | POA: Diagnosis not present

## 2021-03-23 DIAGNOSIS — F4310 Post-traumatic stress disorder, unspecified: Secondary | ICD-10-CM | POA: Diagnosis not present

## 2021-03-28 ENCOUNTER — Encounter (INDEPENDENT_AMBULATORY_CARE_PROVIDER_SITE_OTHER): Payer: Self-pay | Admitting: Physician Assistant

## 2021-03-28 ENCOUNTER — Ambulatory Visit (INDEPENDENT_AMBULATORY_CARE_PROVIDER_SITE_OTHER): Payer: Medicare HMO | Admitting: Physician Assistant

## 2021-03-28 ENCOUNTER — Other Ambulatory Visit: Payer: Self-pay

## 2021-03-28 VITALS — BP 95/65 | HR 72 | Temp 97.7°F | Ht 68.0 in | Wt 278.0 lb

## 2021-03-28 DIAGNOSIS — E559 Vitamin D deficiency, unspecified: Secondary | ICD-10-CM | POA: Diagnosis not present

## 2021-03-28 DIAGNOSIS — E669 Obesity, unspecified: Secondary | ICD-10-CM | POA: Diagnosis not present

## 2021-03-28 DIAGNOSIS — Z6841 Body Mass Index (BMI) 40.0 and over, adult: Secondary | ICD-10-CM | POA: Diagnosis not present

## 2021-03-28 MED ORDER — VITAMIN D (ERGOCALCIFEROL) 1.25 MG (50000 UNIT) PO CAPS: ORAL_CAPSULE | ORAL | 0 refills | Status: AC

## 2021-03-28 MED ORDER — DEPO-ESTRADIOL 5 MG/ML IM OIL
TOPICAL_OIL | INTRAMUSCULAR | 0 refills | Status: DC
Start: 2021-03-28 — End: 2021-05-11

## 2021-03-28 NOTE — Telephone Encounter (Signed)
Medication RX for Depo-Estradiol changed to 5 ml, only dispensed this way due to vial size.  ?

## 2021-03-28 NOTE — Progress Notes (Signed)
? ? ? ?Chief Complaint:  ? ?OBESITY ?Lucious is here to discuss her progress with her obesity treatment plan along with follow-up of her obesity related diagnoses. Hannah is on the Category 4 Plan and states she is following her eating plan approximately 80% of the time. Josejavier states she is doing 0 minutes 0 times per week. ? ?Today's visit was #: 3 ?Starting weight: 288 lbs ?Starting date: 02/22/2021 ?Today's weight: 278 lbs ?Today's date: 03/28/2021 ?Total lbs lost to date: 10 lbs ?Total lbs lost since last in-office visit: 4 lbs ? ?Interim History: She is having trouble chewing meat due to dental issues. Otherwise, she is not having any other issues with the plan. We reviewed sources of soft protein today.  ? ?Subjective:  ? ?1. Vitamin D deficiency ?Theran is currently no Vitamin D weekly. ? ?Assessment/Plan:  ? ?1. Vitamin D deficiency ?Low Vitamin D level contributes to fatigue and are associated with obesity, breast, and colon cancer. We will refill prescription Vitamin D 50,000 IU every week for 1 month with no refills and Eiljah will follow-up for routine testing of Vitamin D, at least 2-3 times per year to avoid over-replacement. ? ?- Vitamin D, Ergocalciferol, (DRISDOL) 1.25 MG (50000 UNIT) CAPS capsule; Take one tablet wkly  Dispense: 4 capsule; Refill: 0 ? ?2. Obesity with current BMI of 42.9 ?Quasim is currently in the action stage of change. As such, her goal is to continue with weight loss efforts. She has agreed to the Category 4 Plan.  ? ?Exercise goals: No exercise has been prescribed at this time. ? ?Behavioral modification strategies: meal planning and cooking strategies and planning for success. ? ?Kylen has agreed to follow-up with our clinic in 2 weeks. She was informed of the importance of frequent follow-up visits to maximize her success with intensive lifestyle modifications for her multiple health conditions.  ? ?Objective:  ? ?Blood pressure 95/65, pulse 72, temperature 97.7 ?F (36.5  ?C), height 5\' 8"  (1.727 m), weight 278 lb (126.1 kg), SpO2 98 %. ?Body mass index is 42.27 kg/m?. ? ?General: Cooperative, alert, well developed, in no acute distress. ?HEENT: Conjunctivae and lids unremarkable. ?Cardiovascular: Regular rhythm.  ?Lungs: Normal work of breathing. ?Neurologic: No focal deficits.  ? ?Lab Results  ?Component Value Date  ? CREATININE 1.42 (H) 01/26/2021  ? BUN 16 01/26/2021  ? NA 136 01/26/2021  ? K 4.3 01/26/2021  ? CL 101 01/26/2021  ? CO2 27 01/26/2021  ? ?Lab Results  ?Component Value Date  ? ALT 13 01/26/2021  ? AST 19 01/26/2021  ? BILITOT 0.5 01/26/2021  ? ?Lab Results  ?Component Value Date  ? HGBA1C 5.6 01/26/2021  ? HGBA1C 5.2 07/11/2020  ? HGBA1C 5.4 04/22/2019  ? ?Lab Results  ?Component Value Date  ? INSULIN 18.1 02/22/2021  ? ?Lab Results  ?Component Value Date  ? TSH 1.670 02/22/2021  ? ?Lab Results  ?Component Value Date  ? CHOL 208 (H) 01/26/2021  ? HDL 29 (L) 01/26/2021  ? LDLCALC 137 (H) 01/26/2021  ? TRIG 274 (H) 01/26/2021  ? CHOLHDL 7.2 (H) 01/26/2021  ? ?Lab Results  ?Component Value Date  ? VD25OH 27.6 (L) 02/22/2021  ? ?Lab Results  ?Component Value Date  ? WBC 8.1 01/26/2021  ? HGB 12.7 (L) 01/26/2021  ? HCT 38.1 (L) 01/26/2021  ? MCV 92.5 01/26/2021  ? PLT 425 (H) 01/26/2021  ? ?No results found for: IRON, TIBC, FERRITIN ? ?Obesity Behavioral Intervention:  ? ?Approximately 15 minutes were  spent on the discussion below. ? ?ASK: ?We discussed the diagnosis of obesity with Ovid Curd today and Darrious agreed to give [place] permission to discuss obesity behavioral modification therapy today. ? ?ASSESS: ?Ubaid has the diagnosis of obesity and her BMI today is 42.4. Jazier is in the action stage of change.  ? ?ADVISE: ?Bowdy was educated on the multiple health risks of obesity as well as the benefit of weight loss to improve her health. She was advised of the need for long term treatment and the importance of lifestyle modifications to improve her current health and to  decrease her risk of future health problems. ? ?AGREE: ?Multiple dietary modification options and treatment options were discussed and Jarmar agreed to follow the recommendations documented in the above note. ? ?ARRANGE: ?Maks was educated on the importance of frequent visits to treat obesity as outlined per CMS and USPSTF guidelines and agreed to schedule her next follow up appointment today. ? ?Attestation Statements:  ? ?Reviewed by clinician on day of visit: allergies, medications, problem list, medical history, surgical history, family history, social history, and previous encounter notes. ? ?I, Tonye Pearson, am acting as Location manager for [employer], PA-C. ? ?I have reviewed the above documentation for accuracy and completeness, and I agree with the above. Abby Potash, PA-C ? ?

## 2021-03-28 NOTE — Addendum Note (Signed)
Addended bySilvio Pate on: 03/28/2021 09:49 AM ? ? Modules accepted: Orders ? ?

## 2021-04-02 ENCOUNTER — Other Ambulatory Visit (INDEPENDENT_AMBULATORY_CARE_PROVIDER_SITE_OTHER): Payer: Self-pay | Admitting: Family Medicine

## 2021-04-02 DIAGNOSIS — E559 Vitamin D deficiency, unspecified: Secondary | ICD-10-CM

## 2021-04-04 DIAGNOSIS — R69 Illness, unspecified: Secondary | ICD-10-CM | POA: Diagnosis not present

## 2021-04-04 DIAGNOSIS — F4310 Post-traumatic stress disorder, unspecified: Secondary | ICD-10-CM | POA: Diagnosis not present

## 2021-04-11 ENCOUNTER — Ambulatory Visit (INDEPENDENT_AMBULATORY_CARE_PROVIDER_SITE_OTHER): Payer: Medicare HMO | Admitting: Family Medicine

## 2021-04-12 ENCOUNTER — Ambulatory Visit (INDEPENDENT_AMBULATORY_CARE_PROVIDER_SITE_OTHER): Payer: Medicare HMO | Admitting: Medical-Surgical

## 2021-04-12 ENCOUNTER — Encounter: Payer: Self-pay | Admitting: Medical-Surgical

## 2021-04-12 VITALS — BP 101/72 | HR 73 | Resp 20 | Ht 68.0 in | Wt 279.0 lb

## 2021-04-12 DIAGNOSIS — Z23 Encounter for immunization: Secondary | ICD-10-CM | POA: Diagnosis not present

## 2021-04-12 DIAGNOSIS — F418 Other specified anxiety disorders: Secondary | ICD-10-CM

## 2021-04-12 DIAGNOSIS — Z789 Other specified health status: Secondary | ICD-10-CM | POA: Diagnosis not present

## 2021-04-12 DIAGNOSIS — E7849 Other hyperlipidemia: Secondary | ICD-10-CM

## 2021-04-12 DIAGNOSIS — R69 Illness, unspecified: Secondary | ICD-10-CM | POA: Diagnosis not present

## 2021-04-12 DIAGNOSIS — F649 Gender identity disorder, unspecified: Secondary | ICD-10-CM | POA: Diagnosis not present

## 2021-04-12 MED ORDER — BUPROPION HCL ER (XL) 450 MG PO TB24: 1.0000 | ORAL_TABLET | Freq: Every day | ORAL | 1 refills | Status: DC

## 2021-04-12 MED ORDER — AMITRIPTYLINE HCL 25 MG PO TABS: 25.0000 mg | ORAL_TABLET | Freq: Every day | ORAL | 0 refills | Status: DC

## 2021-04-12 NOTE — Progress Notes (Signed)
?  HPI with pertinent ROS:  ? ?CC: gender dysphoria follow up ? ?HPI: ?Pleasant 46 year old transgender male presenting today for the following: ? ?Gender dysphoria/transgender-increased estradiol to 5 mg weekly near the end of January. Has tolerated the dose change well and is having no significant concerns. Has not had labs rechecked since the change in dose. Interested in information about adding Progesterone to her medications.  ? ?Hyperlipidemia-started Lipitor 10 mg daily, taking as prescribed, tolerating well without side effects.  ? ?Mood- currently taking Wellbutrin 450mg  daily, Cymbalta 60mg  daily, and Amitriptyline 25mg  nightly. Tried to take Cymbalta twice daily in the past but this caused sleep paralysis. Does well on the current regimen. Denies SI/HI. Is struggling a bit with increased anxiety due to external factors, mainly recent political and new reports regarding LGBTQ+ issues. Plans to move to in the next year since [place] have more LGBTQ+ friendly laws.  ? ?I reviewed the past medical history, family history, social history, surgical history, and allergies today and no changes were needed.  Please see the problem list section below in epic for further details. ? ? ?Physical exam:  ? ?General: Well Developed, well nourished, and in no acute distress.  ?Neuro: Alert and oriented x3.  ?HEENT: Normocephalic, atraumatic.  ?Skin: Warm and dry. ?Cardiac: Regular rate and rhythm, no murmurs rubs or gallops, no lower extremity edema.  ?Respiratory: Clear to auscultation bilaterally. Not using accessory muscles, speaking in full sentences. ? ?Impression and Recommendations:   ? ?1. Gender dysphoria ?2. Transgender ?Rechecking Estradiol levels. Continue 5mg  IM weekly, titrate per results. Discussed use of Progesterone, risks involved, and recommended resources for self-education. Advised to do some research in this and let me know if she would like to proceed with a trial of Progesterone.  ?-  Estradiol ? ?3. Other hyperlipidemia ?Checking lipids and CMP. Continue Lipitor 10mg  daily.  ?- Lipid panel ?- Comprehensive Metabolic Panel (CMET) ? ?4. Need for tetanus booster ?Tdap given in office today.  ?- Tdap vaccine greater than or equal to 7yo IM ? ?5. Depression with anxiety ?Refilling Wellbutrin. Continue Cymbalta. Reordering Amitriptyline 25mg  nightly.  ?- buPROPion HCl ER, XL, 450 MG TB24; Take 1 tablet by mouth daily.  Dispense: 90 tablet; Refill: 1 ? ?Return in about 3 months (around 07/12/2021) for gender dysphoria/transgender care follow up. ?___________________________________________ ? , DNP, APRN, FNP-BC ?Primary Care and Sports Medicine ?[facility] [place] ?

## 2021-04-14 ENCOUNTER — Encounter: Payer: Self-pay | Admitting: Medical-Surgical

## 2021-04-14 MED ORDER — PROGESTERONE MICRONIZED 100 MG PO CAPS: 100.0000 mg | ORAL_CAPSULE | Freq: Every day | ORAL | 0 refills | Status: DC

## 2021-04-20 DIAGNOSIS — R69 Illness, unspecified: Secondary | ICD-10-CM | POA: Diagnosis not present

## 2021-04-20 DIAGNOSIS — F4310 Post-traumatic stress disorder, unspecified: Secondary | ICD-10-CM | POA: Diagnosis not present

## 2021-04-21 ENCOUNTER — Other Ambulatory Visit: Payer: Self-pay | Admitting: Medical-Surgical

## 2021-04-21 MED ORDER — OZEMPIC (0.25 OR 0.5 MG/DOSE) 2 MG/3ML ~~LOC~~ SOPN: 0.5000 mg | PEN_INJECTOR | SUBCUTANEOUS | 0 refills | Status: DC

## 2021-04-29 ENCOUNTER — Other Ambulatory Visit (INDEPENDENT_AMBULATORY_CARE_PROVIDER_SITE_OTHER): Payer: Self-pay | Admitting: Physician Assistant

## 2021-04-29 DIAGNOSIS — E559 Vitamin D deficiency, unspecified: Secondary | ICD-10-CM

## 2021-05-01 DIAGNOSIS — F4310 Post-traumatic stress disorder, unspecified: Secondary | ICD-10-CM | POA: Diagnosis not present

## 2021-05-01 DIAGNOSIS — R69 Illness, unspecified: Secondary | ICD-10-CM | POA: Diagnosis not present

## 2021-05-04 ENCOUNTER — Other Ambulatory Visit: Payer: Self-pay | Admitting: Medical-Surgical

## 2021-05-04 DIAGNOSIS — Z789 Other specified health status: Secondary | ICD-10-CM

## 2021-05-04 DIAGNOSIS — E7800 Pure hypercholesterolemia, unspecified: Secondary | ICD-10-CM

## 2021-05-04 DIAGNOSIS — J0100 Acute maxillary sinusitis, unspecified: Secondary | ICD-10-CM

## 2021-05-05 ENCOUNTER — Other Ambulatory Visit: Payer: Self-pay

## 2021-05-05 DIAGNOSIS — J0100 Acute maxillary sinusitis, unspecified: Secondary | ICD-10-CM

## 2021-05-05 MED ORDER — FLUTICASONE PROPIONATE 50 MCG/ACT NA SUSP: NASAL | 2 refills | Status: DC

## 2021-05-05 NOTE — Telephone Encounter (Signed)
The pharmacy called wanting this prescription changed to 16 grams since this is the only quantity that they carry. ?

## 2021-05-08 DIAGNOSIS — R69 Illness, unspecified: Secondary | ICD-10-CM | POA: Diagnosis not present

## 2021-05-08 DIAGNOSIS — N6489 Other specified disorders of breast: Secondary | ICD-10-CM | POA: Diagnosis not present

## 2021-05-11 ENCOUNTER — Other Ambulatory Visit: Payer: Self-pay | Admitting: Medical-Surgical

## 2021-05-11 ENCOUNTER — Encounter: Payer: Self-pay | Admitting: Medical-Surgical

## 2021-05-11 DIAGNOSIS — Z789 Other specified health status: Secondary | ICD-10-CM

## 2021-05-11 MED ORDER — DEPO-ESTRADIOL 5 MG/ML IM OIL: TOPICAL_OIL | INTRAMUSCULAR | 0 refills | Status: DC

## 2021-05-15 ENCOUNTER — Telehealth (INDEPENDENT_AMBULATORY_CARE_PROVIDER_SITE_OTHER): Payer: Medicare HMO | Admitting: Family Medicine

## 2021-05-15 DIAGNOSIS — R69 Illness, unspecified: Secondary | ICD-10-CM | POA: Diagnosis not present

## 2021-05-15 DIAGNOSIS — F39 Unspecified mood [affective] disorder: Secondary | ICD-10-CM

## 2021-05-15 DIAGNOSIS — Z6841 Body Mass Index (BMI) 40.0 and over, adult: Secondary | ICD-10-CM | POA: Diagnosis not present

## 2021-05-15 DIAGNOSIS — E669 Obesity, unspecified: Secondary | ICD-10-CM | POA: Diagnosis not present

## 2021-05-16 DIAGNOSIS — F4310 Post-traumatic stress disorder, unspecified: Secondary | ICD-10-CM | POA: Diagnosis not present

## 2021-05-16 DIAGNOSIS — R69 Illness, unspecified: Secondary | ICD-10-CM | POA: Diagnosis not present

## 2021-05-25 ENCOUNTER — Other Ambulatory Visit: Payer: Self-pay

## 2021-05-25 MED ORDER — OZEMPIC (0.25 OR 0.5 MG/DOSE) 2 MG/3ML ~~LOC~~ SOPN: 0.5000 mg | PEN_INJECTOR | SUBCUTANEOUS | 0 refills | Status: DC

## 2021-05-26 NOTE — Progress Notes (Unsigned)
TeleHealth Visit:  Due to the COVID-19 pandemic, this visit was completed with telemedicine (audio/video) technology to reduce patient and provider exposure as well as to preserve personal protective equipment.   Gary Sullivan has verbally consented to this TeleHealth visit. The patient is located at home, the provider is located at the [facility] office. The participants in this visit include the listed provider and patient. The visit was conducted today via video.   Chief Complaint: OBESITY Gary Sullivan is here to discuss her progress with her obesity treatment plan along with follow-up of her obesity related diagnoses. Gary Sullivan is on the Category 4 Plan and states she is following her eating plan approximately 40% of the time. Gary Sullivan states she is not currently exercising.  Today's visit was #: 4 Starting weight: 288 lbs Starting date: 02/22/2021  Interim History: Pt reports she is sometimes overly hungry and other times not at all. She is emotionally eating. Pt had GI upset, including N/V/D and fevers the past 24-48 hours.  Subjective:   1. Mood disorder (Gary Sullivan), emotional eating  Pt is going to see her counselor tomorrow and sees her every 2 weeks. Pt has been feeling down lately and like she has no support.  Assessment/Plan:  No orders of the defined types were placed in this encounter.   There are no discontinued medications.   No orders of the defined types were placed in this encounter.    1. Mood disorder (Gary Sullivan), emotional eating  Referral to Dr. Mallie Mussel placed for emotional eating counseling. Continue with her counselor for mood and continue meds per PCP. Pt may want to consider a follow up OV with them to discuss medication changes. Go to support groups.  2. Obesity, Current BMI 42.27 Gary Sullivan is currently in the action stage of change. As such, her goal is to continue with weight loss efforts. She has agreed to the Category 4 Plan.   Exercise goals: All adults should  avoid inactivity. Some physical activity is better than none, and adults who participate in any amount of physical activity gain some health benefits.  Behavioral modification strategies: increasing lean protein intake, decreasing simple carbohydrates, no skipping meals, and avoiding temptations.  Gary Sullivan has agreed to follow-up with our clinic in 3 weeks. She was informed of the importance of frequent follow-up visits to maximize her success with intensive lifestyle modifications for her multiple health conditions.  Objective:   VITALS: Per patient if applicable, see vitals. GENERAL: Alert and in no acute distress. CARDIOPULMONARY: No increased WOB. Speaking in clear sentences.  PSYCH: Pleasant and cooperative. Speech normal rate and rhythm. Affect is appropriate. Insight and judgement are appropriate. Attention is focused, linear, and appropriate.  NEURO: Oriented as arrived to appointment on time with no prompting.   Lab Results  Component Value Date   CREATININE 1.42 (H) 01/26/2021   BUN 16 01/26/2021   NA 136 01/26/2021   K 4.3 01/26/2021   CL 101 01/26/2021   CO2 27 01/26/2021   Lab Results  Component Value Date   ALT 13 01/26/2021   AST 19 01/26/2021   BILITOT 0.5 01/26/2021   Lab Results  Component Value Date   HGBA1C 5.6 01/26/2021   HGBA1C 5.2 07/11/2020   HGBA1C 5.4 04/22/2019   Lab Results  Component Value Date   INSULIN 18.1 02/22/2021   Lab Results  Component Value Date   TSH 1.670 02/22/2021   Lab Results  Component Value Date   CHOL 208 (H) 01/26/2021  HDL 29 (L) 01/26/2021   LDLCALC 137 (H) 01/26/2021   TRIG 274 (H) 01/26/2021   CHOLHDL 7.2 (H) 01/26/2021   Lab Results  Component Value Date   VD25OH 27.6 (L) 02/22/2021   Lab Results  Component Value Date   WBC 8.1 01/26/2021   HGB 12.7 (L) 01/26/2021   HCT 38.1 (L) 01/26/2021   MCV 92.5 01/26/2021   PLT 425 (H) 01/26/2021   No results found for: IRON, TIBC, FERRITIN  Attestation  Statements:   Reviewed by clinician on day of visit: allergies, medications, problem list, medical history, surgical history, family history, social history, and previous encounter notes.  Time spent on visit including pre-visit chart review and post-visit charting and care was 30 minutes.   I, Gary Sullivan November, BS, CMA, am acting as transcriptionist for [employer], DO.  I have reviewed the above documentation for accuracy and completeness, and I agree with the above. Marjory Sneddon, D.O.  The [place] was signed into law in 2016 which includes the topic of electronic health records.  This provides immediate access to information in MyChart.  This includes consultation notes, operative notes, office notes, lab results and pathology reports.  If you have any questions about what you read please let us know at your next visit so we can discuss your concerns and take corrective action if need be.  We are right here with you.

## 2021-05-29 DIAGNOSIS — R69 Illness, unspecified: Secondary | ICD-10-CM | POA: Diagnosis not present

## 2021-05-29 DIAGNOSIS — F4310 Post-traumatic stress disorder, unspecified: Secondary | ICD-10-CM | POA: Diagnosis not present

## 2021-05-31 ENCOUNTER — Other Ambulatory Visit (INDEPENDENT_AMBULATORY_CARE_PROVIDER_SITE_OTHER): Payer: Self-pay | Admitting: Family Medicine

## 2021-05-31 DIAGNOSIS — E559 Vitamin D deficiency, unspecified: Secondary | ICD-10-CM

## 2021-06-15 DIAGNOSIS — F4310 Post-traumatic stress disorder, unspecified: Secondary | ICD-10-CM | POA: Diagnosis not present

## 2021-06-15 DIAGNOSIS — R69 Illness, unspecified: Secondary | ICD-10-CM | POA: Diagnosis not present

## 2021-06-19 DIAGNOSIS — G4700 Insomnia, unspecified: Secondary | ICD-10-CM | POA: Diagnosis not present

## 2021-06-19 DIAGNOSIS — F4312 Post-traumatic stress disorder, chronic: Secondary | ICD-10-CM | POA: Diagnosis not present

## 2021-06-19 DIAGNOSIS — F3113 Bipolar disorder, current episode manic without psychotic features, severe: Secondary | ICD-10-CM | POA: Diagnosis not present

## 2021-06-19 DIAGNOSIS — R69 Illness, unspecified: Secondary | ICD-10-CM | POA: Diagnosis not present

## 2021-06-19 DIAGNOSIS — F411 Generalized anxiety disorder: Secondary | ICD-10-CM | POA: Diagnosis not present

## 2021-06-19 DIAGNOSIS — F902 Attention-deficit hyperactivity disorder, combined type: Secondary | ICD-10-CM | POA: Diagnosis not present

## 2021-06-26 DIAGNOSIS — F4310 Post-traumatic stress disorder, unspecified: Secondary | ICD-10-CM | POA: Diagnosis not present

## 2021-06-26 DIAGNOSIS — R69 Illness, unspecified: Secondary | ICD-10-CM | POA: Diagnosis not present

## 2021-07-03 ENCOUNTER — Other Ambulatory Visit: Payer: Self-pay | Admitting: Osteopathic Medicine

## 2021-07-03 ENCOUNTER — Other Ambulatory Visit: Payer: Self-pay | Admitting: Medical-Surgical

## 2021-07-05 NOTE — Telephone Encounter (Signed)
Virtual visit on 05/13/2021

## 2021-07-11 NOTE — Progress Notes (Unsigned)
   Established Patient Office Visit  Subjective   Patient ID: Gary Sullivan, transgender male   DOB: 1975-07-31 Age: 46 y.o. MRN: [id number]   No chief complaint on file.   HPI Pleasant 45 year old transgender male presenting today for follow-up on hormone therapy.  Currently taking Depo estradiol 1 mL weekly, progesterone 100 mg daily, and spironolactone 50 mg twice daily.  Tolerating all medications well without significant side effects.  Is overdue for labs to be rechecked.  ROS    Objective:    There were no vitals filed for this visit.   Physical Exam    No results found for this or any previous visit (from the past 24 hour(s)).   {Labs (Optional):23779}  The 10-year ASCVD risk score (Arnett DK, et al., 2019) is: 5%   Values used to calculate the score:     Age: 30 years     Sex: Male     Is Non-Hispanic African American: No     Diabetic: Yes     Tobacco smoker: No     Systolic Blood Pressure: 94 mmHg     Is BP treated: Yes     HDL Cholesterol: 29 mg/dL     Total Cholesterol: 208 mg/dL   Assessment & Plan:   No problem-specific Assessment & Plan notes found for this encounter.   No follow-ups on file.  ___________________________________________ Thayer Ohm, DNP, APRN, FNP-BC Primary Care and Sports Medicine [facility] [place]

## 2021-07-12 ENCOUNTER — Ambulatory Visit (INDEPENDENT_AMBULATORY_CARE_PROVIDER_SITE_OTHER): Payer: Medicare HMO | Admitting: Medical-Surgical

## 2021-07-12 ENCOUNTER — Encounter: Payer: Self-pay | Admitting: Medical-Surgical

## 2021-07-12 VITALS — BP 115/78 | HR 83 | Wt 282.0 lb

## 2021-07-12 DIAGNOSIS — R69 Illness, unspecified: Secondary | ICD-10-CM | POA: Diagnosis not present

## 2021-07-12 DIAGNOSIS — Z789 Other specified health status: Secondary | ICD-10-CM

## 2021-07-12 DIAGNOSIS — F4010 Social phobia, unspecified: Secondary | ICD-10-CM

## 2021-07-12 DIAGNOSIS — F649 Gender identity disorder, unspecified: Secondary | ICD-10-CM

## 2021-07-12 DIAGNOSIS — F418 Other specified anxiety disorders: Secondary | ICD-10-CM

## 2021-07-12 DIAGNOSIS — F4310 Post-traumatic stress disorder, unspecified: Secondary | ICD-10-CM | POA: Diagnosis not present

## 2021-07-12 DIAGNOSIS — I10 Essential (primary) hypertension: Secondary | ICD-10-CM

## 2021-07-12 DIAGNOSIS — F39 Unspecified mood [affective] disorder: Secondary | ICD-10-CM

## 2021-07-12 MED ORDER — PROGESTERONE MICRONIZED 100 MG PO CAPS: 100.0000 mg | ORAL_CAPSULE | Freq: Every day | ORAL | 1 refills | Status: DC

## 2021-07-12 MED ORDER — ESTRADIOL 0.1 MG/24HR TD PTWK: 0.1000 mg | MEDICATED_PATCH | TRANSDERMAL | 1 refills | Status: DC

## 2021-07-13 ENCOUNTER — Other Ambulatory Visit: Payer: Self-pay | Admitting: Medical-Surgical

## 2021-07-13 DIAGNOSIS — Z789 Other specified health status: Secondary | ICD-10-CM

## 2021-07-17 DIAGNOSIS — F411 Generalized anxiety disorder: Secondary | ICD-10-CM | POA: Diagnosis not present

## 2021-07-17 DIAGNOSIS — G4700 Insomnia, unspecified: Secondary | ICD-10-CM | POA: Diagnosis not present

## 2021-07-17 DIAGNOSIS — R69 Illness, unspecified: Secondary | ICD-10-CM | POA: Diagnosis not present

## 2021-07-17 DIAGNOSIS — F3113 Bipolar disorder, current episode manic without psychotic features, severe: Secondary | ICD-10-CM | POA: Diagnosis not present

## 2021-07-17 DIAGNOSIS — F4312 Post-traumatic stress disorder, chronic: Secondary | ICD-10-CM | POA: Diagnosis not present

## 2021-07-17 DIAGNOSIS — F902 Attention-deficit hyperactivity disorder, combined type: Secondary | ICD-10-CM | POA: Diagnosis not present

## 2021-07-22 ENCOUNTER — Other Ambulatory Visit: Payer: Self-pay | Admitting: Medical-Surgical

## 2021-07-26 DIAGNOSIS — F4310 Post-traumatic stress disorder, unspecified: Secondary | ICD-10-CM | POA: Diagnosis not present

## 2021-07-26 DIAGNOSIS — R69 Illness, unspecified: Secondary | ICD-10-CM | POA: Diagnosis not present

## 2021-08-02 ENCOUNTER — Other Ambulatory Visit: Payer: Self-pay

## 2021-08-02 DIAGNOSIS — J0100 Acute maxillary sinusitis, unspecified: Secondary | ICD-10-CM

## 2021-08-02 DIAGNOSIS — Z789 Other specified health status: Secondary | ICD-10-CM

## 2021-08-02 DIAGNOSIS — E7800 Pure hypercholesterolemia, unspecified: Secondary | ICD-10-CM

## 2021-08-02 MED ORDER — FLUTICASONE PROPIONATE 50 MCG/ACT NA SUSP: NASAL | 2 refills | Status: DC

## 2021-08-02 MED ORDER — SPIRONOLACTONE 50 MG PO TABS: 50.0000 mg | ORAL_TABLET | Freq: Two times a day (BID) | ORAL | 0 refills | Status: DC

## 2021-08-02 MED ORDER — FENOFIBRATE 160 MG PO TABS: ORAL_TABLET | ORAL | 1 refills | Status: DC

## 2021-08-02 MED ORDER — LISINOPRIL 5 MG PO TABS: 5.0000 mg | ORAL_TABLET | Freq: Every day | ORAL | 0 refills | Status: DC

## 2021-08-05 DIAGNOSIS — I471 Supraventricular tachycardia: Secondary | ICD-10-CM | POA: Diagnosis not present

## 2021-08-05 DIAGNOSIS — I739 Peripheral vascular disease, unspecified: Secondary | ICD-10-CM | POA: Diagnosis not present

## 2021-08-05 DIAGNOSIS — G43909 Migraine, unspecified, not intractable, without status migrainosus: Secondary | ICD-10-CM | POA: Diagnosis not present

## 2021-08-05 DIAGNOSIS — E785 Hyperlipidemia, unspecified: Secondary | ICD-10-CM | POA: Diagnosis not present

## 2021-08-05 DIAGNOSIS — R69 Illness, unspecified: Secondary | ICD-10-CM | POA: Diagnosis not present

## 2021-08-05 DIAGNOSIS — Z008 Encounter for other general examination: Secondary | ICD-10-CM | POA: Diagnosis not present

## 2021-08-05 DIAGNOSIS — Z6841 Body Mass Index (BMI) 40.0 and over, adult: Secondary | ICD-10-CM | POA: Diagnosis not present

## 2021-08-05 DIAGNOSIS — G250 Essential tremor: Secondary | ICD-10-CM | POA: Diagnosis not present

## 2021-08-05 DIAGNOSIS — I10 Essential (primary) hypertension: Secondary | ICD-10-CM | POA: Diagnosis not present

## 2021-08-07 DIAGNOSIS — R69 Illness, unspecified: Secondary | ICD-10-CM | POA: Diagnosis not present

## 2021-08-07 DIAGNOSIS — F4310 Post-traumatic stress disorder, unspecified: Secondary | ICD-10-CM | POA: Diagnosis not present

## 2021-08-09 ENCOUNTER — Encounter (INDEPENDENT_AMBULATORY_CARE_PROVIDER_SITE_OTHER): Payer: Self-pay

## 2021-08-14 DIAGNOSIS — F3113 Bipolar disorder, current episode manic without psychotic features, severe: Secondary | ICD-10-CM | POA: Diagnosis not present

## 2021-08-14 DIAGNOSIS — R69 Illness, unspecified: Secondary | ICD-10-CM | POA: Diagnosis not present

## 2021-08-14 DIAGNOSIS — F902 Attention-deficit hyperactivity disorder, combined type: Secondary | ICD-10-CM | POA: Diagnosis not present

## 2021-08-14 DIAGNOSIS — G4700 Insomnia, unspecified: Secondary | ICD-10-CM | POA: Diagnosis not present

## 2021-08-14 DIAGNOSIS — F4312 Post-traumatic stress disorder, chronic: Secondary | ICD-10-CM | POA: Diagnosis not present

## 2021-09-01 ENCOUNTER — Other Ambulatory Visit: Payer: Self-pay | Admitting: Medical-Surgical

## 2021-09-04 DIAGNOSIS — F4310 Post-traumatic stress disorder, unspecified: Secondary | ICD-10-CM | POA: Diagnosis not present

## 2021-09-04 DIAGNOSIS — R69 Illness, unspecified: Secondary | ICD-10-CM | POA: Diagnosis not present

## 2021-09-06 ENCOUNTER — Other Ambulatory Visit: Payer: Self-pay

## 2021-09-06 DIAGNOSIS — F418 Other specified anxiety disorders: Secondary | ICD-10-CM

## 2021-09-06 MED ORDER — BUPROPION HCL ER (XL) 450 MG PO TB24: 1.0000 | ORAL_TABLET | Freq: Every day | ORAL | 1 refills | Status: DC

## 2021-09-13 DIAGNOSIS — F411 Generalized anxiety disorder: Secondary | ICD-10-CM | POA: Diagnosis not present

## 2021-09-13 DIAGNOSIS — R69 Illness, unspecified: Secondary | ICD-10-CM | POA: Diagnosis not present

## 2021-09-13 DIAGNOSIS — F4312 Post-traumatic stress disorder, chronic: Secondary | ICD-10-CM | POA: Diagnosis not present

## 2021-09-18 DIAGNOSIS — F4310 Post-traumatic stress disorder, unspecified: Secondary | ICD-10-CM | POA: Diagnosis not present

## 2021-09-18 DIAGNOSIS — R69 Illness, unspecified: Secondary | ICD-10-CM | POA: Diagnosis not present

## 2021-10-01 ENCOUNTER — Other Ambulatory Visit: Payer: Self-pay | Admitting: Medical-Surgical

## 2021-10-02 DIAGNOSIS — F4310 Post-traumatic stress disorder, unspecified: Secondary | ICD-10-CM | POA: Diagnosis not present

## 2021-10-23 DIAGNOSIS — F4310 Post-traumatic stress disorder, unspecified: Secondary | ICD-10-CM | POA: Diagnosis not present

## 2021-10-26 ENCOUNTER — Other Ambulatory Visit: Payer: Self-pay | Admitting: Medical-Surgical

## 2021-10-31 ENCOUNTER — Other Ambulatory Visit: Payer: Self-pay | Admitting: Medical-Surgical

## 2021-10-31 DIAGNOSIS — Z789 Other specified health status: Secondary | ICD-10-CM

## 2021-10-31 DIAGNOSIS — E7800 Pure hypercholesterolemia, unspecified: Secondary | ICD-10-CM

## 2021-11-07 ENCOUNTER — Ambulatory Visit (INDEPENDENT_AMBULATORY_CARE_PROVIDER_SITE_OTHER): Payer: Medicaid Other | Admitting: Medical-Surgical

## 2021-11-07 DIAGNOSIS — Z91199 Patient's noncompliance with other medical treatment and regimen due to unspecified reason: Secondary | ICD-10-CM

## 2021-11-07 NOTE — Progress Notes (Unsigned)
   Established Patient Office Visit  Subjective   Patient ID: Gary Sullivan, transgender male   DOB: June 29, 1975 Age: 46 y.o. MRN: [id number]   No chief complaint on file.   HPI Pleasant 46 year old transgenderr male presenting today for the following:  Hormone replacement:  Hypertension:  Hyperlipidemia:  Prediabetes:  Mood management:  Vitamin D deficiency:   Objective:    There were no vitals filed for this visit.  Physical Exam   No results found for this or any previous visit (from the past 24 hour(s)).   {Labs (Optional):23779}  The 10-year ASCVD risk score (Arnett DK, et al., 2019) is: 7.1%   Values used to calculate the score:     Age: 46 years     Sex: Male     Is Non-Hispanic African American: No     Diabetic: Yes     Tobacco smoker: No     Systolic Blood Pressure: 115 mmHg     Is BP treated: Yes     HDL Cholesterol: 29 mg/dL     Total Cholesterol: 208 mg/dL   Assessment & Plan:   No problem-specific Assessment & Plan notes found for this encounter.   No follow-ups on file.  ___________________________________________ Thayer Ohm, DNP, APRN, FNP-BC Primary Care and Sports Medicine [facility] [place]

## 2021-11-09 DIAGNOSIS — F4310 Post-traumatic stress disorder, unspecified: Secondary | ICD-10-CM | POA: Diagnosis not present

## 2021-11-19 NOTE — Progress Notes (Unsigned)
   Established Patient Office Visit  Subjective   Patient ID: Gary Sullivan, transgender male   DOB: 1975/07/30 Age: 46 y.o. MRN: [id number]   No chief complaint on file.   HPI    Objective:    There were no vitals filed for this visit.  Physical Exam   No results found for this or any previous visit (from the past 24 hour(s)).   {Labs (Optional):23779}  The 10-year ASCVD risk score (Arnett DK, et al., 2019) is: 7.1%   Values used to calculate the score:     Age: 3 years     Sex: Male     Is Non-Hispanic African American: No     Diabetic: Yes     Tobacco smoker: No     Systolic Blood Pressure: 115 mmHg     Is BP treated: Yes     HDL Cholesterol: 29 mg/dL     Total Cholesterol: 208 mg/dL   Assessment & Plan:   No problem-specific Assessment & Plan notes found for this encounter.   No follow-ups on file.  ___________________________________________ Thayer Ohm, DNP, APRN, FNP-BC Primary Care and Sports Medicine [facility] [place]

## 2021-11-20 ENCOUNTER — Ambulatory Visit (INDEPENDENT_AMBULATORY_CARE_PROVIDER_SITE_OTHER): Payer: Medicare Other | Admitting: Medical-Surgical

## 2021-11-20 ENCOUNTER — Encounter: Payer: Self-pay | Admitting: Medical-Surgical

## 2021-11-20 VITALS — BP 97/64 | HR 90 | Wt 303.0 lb

## 2021-11-20 DIAGNOSIS — L659 Nonscarring hair loss, unspecified: Secondary | ICD-10-CM | POA: Diagnosis not present

## 2021-11-20 DIAGNOSIS — I10 Essential (primary) hypertension: Secondary | ICD-10-CM

## 2021-11-20 DIAGNOSIS — Z1329 Encounter for screening for other suspected endocrine disorder: Secondary | ICD-10-CM | POA: Diagnosis not present

## 2021-11-20 DIAGNOSIS — F39 Unspecified mood [affective] disorder: Secondary | ICD-10-CM | POA: Diagnosis not present

## 2021-11-20 DIAGNOSIS — Z789 Other specified health status: Secondary | ICD-10-CM

## 2021-11-20 DIAGNOSIS — F4010 Social phobia, unspecified: Secondary | ICD-10-CM | POA: Diagnosis not present

## 2021-11-20 MED ORDER — ATOMOXETINE HCL 60 MG PO CAPS: 60.0000 mg | ORAL_CAPSULE | Freq: Every morning | ORAL | 0 refills | Status: DC

## 2021-11-20 MED ORDER — VRAYLAR 1.5 MG PO CAPS: 3.0000 mg | ORAL_CAPSULE | Freq: Every day | ORAL | 2 refills | Status: DC

## 2021-11-20 MED ORDER — BUPROPION HCL ER (SR) 200 MG PO TB12: 200.0000 mg | ORAL_TABLET | Freq: Two times a day (BID) | ORAL | 1 refills | Status: DC

## 2021-11-20 MED ORDER — DUTASTERIDE 0.5 MG PO CAPS: 0.5000 mg | ORAL_CAPSULE | Freq: Every day | ORAL | 3 refills | Status: DC

## 2021-11-20 MED ORDER — DULOXETINE HCL 60 MG PO CPEP: 60.0000 mg | ORAL_CAPSULE | Freq: Every day | ORAL | 0 refills | Status: DC

## 2021-11-20 MED ORDER — PROGESTERONE MICRONIZED 100 MG PO CAPS: 100.0000 mg | ORAL_CAPSULE | Freq: Every day | ORAL | 1 refills | Status: DC

## 2021-11-20 MED ORDER — TOPIRAMATE 50 MG PO TABS: ORAL_TABLET | ORAL | 1 refills | Status: DC

## 2021-11-20 MED ORDER — SPIRONOLACTONE 50 MG PO TABS: 50.0000 mg | ORAL_TABLET | Freq: Two times a day (BID) | ORAL | 0 refills | Status: DC

## 2021-11-21 ENCOUNTER — Encounter: Payer: Self-pay | Admitting: Medical-Surgical

## 2021-11-21 LAB — CBC WITH DIFFERENTIAL/PLATELET
Absolute Monocytes: 714 cells/uL (ref 200–950)
Basophils Absolute: 43 cells/uL (ref 0–200)
Basophils Relative: 0.5 %
Eosinophils Absolute: 258 cells/uL (ref 15–500)
Eosinophils Relative: 3 %
HCT: 39.1 % (ref 38.5–50.0)
Hemoglobin: 13.6 g/dL (ref 13.2–17.1)
Lymphs Abs: 2657 cells/uL (ref 850–3900)
MCH: 31 pg (ref 27.0–33.0)
MCHC: 34.8 g/dL (ref 32.0–36.0)
MCV: 89.1 fL (ref 80.0–100.0)
MPV: 8.8 fL (ref 7.5–12.5)
Monocytes Relative: 8.3 %
Neutro Abs: 4928 cells/uL (ref 1500–7800)
Neutrophils Relative %: 57.3 %
Platelets: 406 10*3/uL — ABNORMAL HIGH (ref 140–400)
RBC: 4.39 10*6/uL (ref 4.20–5.80)
RDW: 12.3 % (ref 11.0–15.0)
Total Lymphocyte: 30.9 %
WBC: 8.6 10*3/uL (ref 3.8–10.8)

## 2021-11-21 LAB — COMPLETE METABOLIC PANEL WITH GFR
AG Ratio: 1.7 (calc) (ref 1.0–2.5)
ALT: 15 U/L (ref 9–46)
AST: 16 U/L (ref 10–40)
Albumin: 4.4 g/dL (ref 3.6–5.1)
Alkaline phosphatase (APISO): 37 U/L (ref 36–130)
BUN/Creatinine Ratio: 11 (calc) (ref 6–22)
BUN: 15 mg/dL (ref 7–25)
CO2: 29 mmol/L (ref 20–32)
Calcium: 9.6 mg/dL (ref 8.6–10.3)
Chloride: 100 mmol/L (ref 98–110)
Creat: 1.33 mg/dL — ABNORMAL HIGH (ref 0.60–1.29)
Globulin: 2.6 g/dL (calc) (ref 1.9–3.7)
Glucose, Bld: 85 mg/dL (ref 65–99)
Potassium: 4.9 mmol/L (ref 3.5–5.3)
Sodium: 137 mmol/L (ref 135–146)
Total Bilirubin: 0.5 mg/dL (ref 0.2–1.2)
Total Protein: 7 g/dL (ref 6.1–8.1)
eGFR: 68 mL/min/{1.73_m2} (ref >=60)

## 2021-11-21 LAB — LIPID PANEL
Cholesterol: 192 mg/dL (ref <200)
HDL: 35 mg/dL — ABNORMAL LOW (ref >=40)
LDL Cholesterol (Calc): 122 mg/dL (calc) — ABNORMAL HIGH
Non-HDL Cholesterol (Calc): 157 mg/dL (calc) — ABNORMAL HIGH (ref <130)
Total CHOL/HDL Ratio: 5.5 (calc) — ABNORMAL HIGH (ref <5.0)
Triglycerides: 232 mg/dL — ABNORMAL HIGH (ref <150)

## 2021-11-21 LAB — ESTRADIOL: Estradiol: 150 pg/mL — ABNORMAL HIGH (ref <=39)

## 2021-11-21 LAB — TSH: TSH: 1.82 mIU/L (ref 0.40–4.50)

## 2021-11-21 LAB — TESTOSTERONE: Testosterone: 10 ng/dL — ABNORMAL LOW (ref 250–827)

## 2021-11-30 ENCOUNTER — Other Ambulatory Visit: Payer: Self-pay | Admitting: Medical-Surgical

## 2021-11-30 DIAGNOSIS — F4310 Post-traumatic stress disorder, unspecified: Secondary | ICD-10-CM | POA: Diagnosis not present

## 2021-12-14 DIAGNOSIS — F4310 Post-traumatic stress disorder, unspecified: Secondary | ICD-10-CM | POA: Diagnosis not present

## 2021-12-18 ENCOUNTER — Other Ambulatory Visit: Payer: Self-pay

## 2021-12-18 DIAGNOSIS — J0100 Acute maxillary sinusitis, unspecified: Secondary | ICD-10-CM

## 2021-12-18 MED ORDER — BUPROPION HCL ER (SR) 200 MG PO TB12: 200.0000 mg | ORAL_TABLET | Freq: Two times a day (BID) | ORAL | 1 refills | Status: DC

## 2021-12-18 MED ORDER — CYCLOBENZAPRINE HCL 10 MG PO TABS: 10.0000 mg | ORAL_TABLET | Freq: Three times a day (TID) | ORAL | 0 refills | Status: DC | PRN

## 2021-12-18 MED ORDER — GLYCOPYRROLATE 1 MG PO TABS: 2.0000 mg | ORAL_TABLET | Freq: Two times a day (BID) | ORAL | 1 refills | Status: DC

## 2021-12-18 MED ORDER — FLUTICASONE PROPIONATE 50 MCG/ACT NA SUSP: NASAL | 2 refills | Status: DC

## 2021-12-30 ENCOUNTER — Other Ambulatory Visit: Payer: Self-pay | Admitting: Medical-Surgical

## 2022-01-04 ENCOUNTER — Other Ambulatory Visit: Payer: Self-pay | Admitting: Medical-Surgical

## 2022-01-04 ENCOUNTER — Telehealth: Payer: Medicare Other | Admitting: Family Medicine

## 2022-01-04 DIAGNOSIS — R3989 Other symptoms and signs involving the genitourinary system: Secondary | ICD-10-CM

## 2022-01-04 DIAGNOSIS — Z789 Other specified health status: Secondary | ICD-10-CM

## 2022-01-04 NOTE — Progress Notes (Signed)
[place]   Needs urine screening for treatment

## 2022-01-06 ENCOUNTER — Telehealth: Payer: Self-pay | Admitting: Emergency Medicine

## 2022-01-06 NOTE — Telephone Encounter (Signed)
Call to [facility] by this RN regarding visit  on 01/07/22. Pt confirmed she had taken AZO last night - none today - instructed by this RN to hold AZO until seen tomorrow- okay to came earlier if she wants. No other questions at this time

## 2022-01-07 ENCOUNTER — Ambulatory Visit
Admission: RE | Admit: 2022-01-07 | Discharge: 2022-01-07 | Disposition: A | Discharge status: Home / Self Care | Payer: Medicare Other | Source: Ambulatory Visit | Attending: Family Medicine | Admitting: Family Medicine

## 2022-01-07 VITALS — BP 97/64 | HR 91 | Temp 98.4°F | Resp 18 | Ht 68.0 in | Wt 300.0 lb

## 2022-01-07 DIAGNOSIS — N390 Urinary tract infection, site not specified: Secondary | ICD-10-CM

## 2022-01-07 DIAGNOSIS — R300 Dysuria: Secondary | ICD-10-CM | POA: Diagnosis not present

## 2022-01-07 LAB — POCT URINALYSIS DIP (MANUAL ENTRY)
Bilirubin, UA: NEGATIVE
Blood, UA: NEGATIVE
Glucose, UA: NEGATIVE mg/dL
Ketones, POC UA: NEGATIVE mg/dL
Leukocytes, UA: NEGATIVE
Nitrite, UA: NEGATIVE
Spec Grav, UA: 1.02 (ref 1.010–1.025)
Urobilinogen, UA: 2 E.U./dL — AB
pH, UA: 6.5 (ref 5.0–8.0)

## 2022-01-07 MED ORDER — CEPHALEXIN 500 MG PO CAPS: 500.0000 mg | ORAL_CAPSULE | Freq: Two times a day (BID) | ORAL | 0 refills | Status: DC

## 2022-01-07 NOTE — ED Triage Notes (Signed)
Patient c/o possible UTI, dysuria, urgency, frequency x 3-4 days.  Denies any hematuria.  Patient has taken AZO.

## 2022-01-07 NOTE — ED Provider Notes (Signed)
[facility]    CSN: [id number] Arrival date & time: 01/07/22  1145      History   Chief Complaint Chief Complaint  Patient presents with   Urinary Frequency    had e-visit for UTI/possible bladder infection was told to see urgent care instead. - Entered by patient    HPI Gary Sullivan is a 47 y.o. adult.   HPI  Patient is here for evaluation of a possible urinary tract infection.  Has dysuria urgency and frequency for the last 3 days.  No hematuria.  No discharge.  He states he has not had any sexual encounters for over a year, certain he does not have an STI.  No fever or chills.  No nausea or vomiting.  No history of kidney infection.  He has had kidney stones in the past and this feels different.  Denies any flank pain  Past Medical History:  Diagnosis Date   ADHD    Alcohol abuse    Allergy    Anxiety    Asthma    Back pain    CFS (chronic fatigue syndrome)    Constipation    Depression    Diabetes mellitus without complication ([place])    Drug use    Elevated heart rate with elevated blood pressure and diagnosis of hypertension    Environmental allergies    Fatty liver    Fibromyalgia    Fibromyalgia    Gallbladder problem    Gender dysphoria    GERD (gastroesophageal reflux disease)    Has daytime drowsiness    High cholesterol    History of prediabetes 06/04/2019   Hyperhidrosis    Hypertension    IBS (irritable bowel syndrome)    Pre-diabetes    Sleep apnea    Social anxiety disorder    Swallowing difficulty    Tachycardia    Transgender 04/21/2019   Tremor of both hands     Patient Active Problem List   Diagnosis Date Noted   Mood disorder ([place])- mixed anxiety and depression 02/22/2021   Pre-diabetes 08/16/2020   Polyarthralgia 05/10/2020   Chronic right shoulder pain 03/29/2020   Broken or cracked tooth, nontraumatic 02/09/2020   Poor dentition 02/09/2020   Tooth pain 02/09/2020   Transgender 04/21/2019   Carpal tunnel syndrome  on right 10/07/2018   OSA (obstructive sleep apnea) 06/19/2018   Excessive daytime sleepiness 06/19/2018   Numbness 05/28/2018   Tremor 05/28/2018   Fibromyalgia 05/28/2018   Depression with anxiety 05/28/2018   High cholesterol 01/02/2012   High blood pressure 01/02/2008   IBS (irritable bowel syndrome) 01/01/2006   GERD (gastroesophageal reflux disease) 01/01/2005   Social anxiety disorder 01/02/1988   Gender dysphoria 01/01/1985   Asthma 01/01/1982    Past Surgical History:  Procedure Laterality Date   CARPAL TUNNEL RELEASE Right 10/08/2018   Procedure: RIGHT CARPAL TUNNEL RELEASE;  Surgeon: Leandrew Koyanagi, MD;  Location: [facility];  Service: Orthopedics;  Laterality: Right;   CHOLECYSTECTOMY     COLONOSCOPY     Around 2014. [place]     Aound 2014 [place]   labrum repair     NASAL SEPTUM SURGERY     SHOULDER ARTHROSCOPY WITH SUBACROMIAL DECOMPRESSION Left    TONSILLECTOMY         Home Medications    Prior to Admission medications   Medication Sig Start Date End Date Taking? Authorizing Provider  amitriptyline (ELAVIL) 25 MG  tablet Take 1 tablet by mouth at bedtime. 01/02/22  Yes Samuel Bouche, NP  atenolol (TENORMIN) 50 MG tablet Take 1 tablet by mouth daily. 01/02/22  Yes Samuel Bouche, NP  atomoxetine (STRATTERA) 60 MG capsule Take 1 capsule (60 mg total) by mouth every morning. 11/20/21  Yes Samuel Bouche, NP  atorvastatin (LIPITOR) 10 MG tablet Take 1 tablet (10 mg total) by mouth daily. 01/27/21  Yes Samuel Bouche, NP  buPROPion (WELLBUTRIN SR) 200 MG 12 hr tablet Take 1 tablet (200 mg total) by mouth 2 (two) times daily. 12/18/21  Yes Samuel Bouche, NP  cephALEXin (KEFLEX) 500 MG capsule Take 1 capsule (500 mg total) by mouth 2 (two) times daily. 01/07/22  Yes Raylene Everts, MD  Cholecalciferol (VITAMIN D3 PO) Take 50,000 Units by mouth once a week.   Yes [provider]   cholestyramine (QUESTRAN) 4 g packet Take 1 packet by mouth daily. 10/31/21  Yes Samuel Bouche, NP  cyclobenzaprine (FLEXERIL) 10 MG tablet Take 1 tablet (10 mg total) by mouth 3 (three) times daily as needed for muscle spasms. MILD/MODERATE SPASM 12/18/21  Yes Samuel Bouche, NP  DEPO-ESTRADIOL 5 MG/ML injection Inject 1 ml under the skin once weekly. 10/26/21  Yes Samuel Bouche, NP  dexlansoprazole (DEXILANT) 60 MG capsule Take 1 capsule by mouth daily. 10/31/21  Yes Samuel Bouche, NP  dicyclomine (BENTYL) 20 MG tablet Take 1 tablet (20 mg total) by mouth every 6 (six) hours as needed for spasms. 08/16/20  Yes Emeterio Reeve, DO  DULoxetine (CYMBALTA) 60 MG capsule Take 1 capsule (60 mg total) by mouth daily. 11/20/21  Yes Samuel Bouche, NP  dutasteride (AVODART) 0.5 MG capsule Take 1 capsule (0.5 mg total) by mouth daily. 11/20/21  Yes Samuel Bouche, NP  fenofibrate 160 MG tablet Take 1 tablet by mouth daily. 08/02/21  Yes Samuel Bouche, NP  fluticasone (FLONASE) 50 MCG/ACT nasal spray Shake liquid and instill 2 sprays in each nostril daily. 12/18/21  Yes Samuel Bouche, NP  glycopyrrolate (ROBINUL) 1 MG tablet Take 2 tablets (2 mg total) by mouth 2 (two) times daily. 12/18/21  Yes Samuel Bouche, NP  ibuprofen (ADVIL) 800 MG tablet Take 1 tablet (800 mg total) by mouth every 8 (eight) hours as needed. 03/16/21  Yes Mar Daring, PA-C  lisinopril (ZESTRIL) 5 MG tablet Take 1 tablet by mouth daily. 10/31/21  Yes Samuel Bouche, NP  metoCLOPramide (REGLAN) 10 MG tablet Take 1 tablet (10 mg total) by mouth every 6 (six) hours as needed (headache). 07/27/20  Yes Emeterio Reeve, DO  Omega-3 1000 MG CAPS Take by mouth.   Yes [provider]  OZEMPIC, 0.25 OR 0.5 MG/DOSE, 2 MG/3ML SOPN Inject 0.5 mg under the skin once weekly. 11/30/21  Yes Samuel Bouche, NP  primidone (MYSOLINE) 50 MG tablet Take 1 tablet by mouth twice daily. 01/02/22  Yes Samuel Bouche, NP  progesterone (PROMETRIUM) 100 MG capsule Take 1  capsule (100 mg total) by mouth daily. 11/20/21  Yes Samuel Bouche, NP  spironolactone (ALDACTONE) 50 MG tablet Take 1 tablet (50 mg total) by mouth 2 (two) times daily. 11/20/21  Yes Samuel Bouche, NP  topiramate (TOPAMAX) 50 MG tablet TAKE 1 TO 2 TABLETS BY MOUTH EVERY NIGHT AT BEDTIME. 11/20/21  Yes Jessup, Joy, NP  Vitamin D, Ergocalciferol, (DRISDOL) 1.25 MG (50000 UNIT) CAPS capsule Take one tablet wkly 03/28/21  Yes Aguilar, Tracey, PA-C  VRAYLAR 1.5 MG capsule Take 2 capsules (3 mg total) by mouth daily. 11/20/21  Yes Christen Butter, NP    Family History Family History  Problem Relation Age of Onset   High blood pressure Mother    High blood pressure Father    Irritable bowel syndrome Father    Irritable bowel syndrome Sister    Irritable bowel syndrome Sister    High blood pressure Brother    Colon cancer Maternal Aunt    Testicular cancer Paternal Uncle    Pancreatic cancer Maternal Grandfather    Colon cancer Maternal Grandfather    Lung cancer Paternal Grandmother    Diabetes Paternal Grandfather    Esophageal cancer Neg Hx     Social History Social History   Tobacco Use   Smoking status: Former    Types: Cigarettes   Smokeless tobacco: Never  Vaping Use   Vaping Use: Former   Quit date: 10/31/2020  Substance Use Topics   Alcohol use: Not Currently   Drug use: Not Currently     Allergies   Molds & smuts, Dog epithelium allergy skin test, Sulfamethoxazole-trimethoprim, Grass pollen(k-o-r-t-swt vern), Sulfa antibiotics, and Cat hair extract   Review of Systems Review of Systems See HPI  Physical Exam Triage Vital Signs ED Triage Vitals [01/07/22 1200]  Enc Vitals Group     BP 97/64     Pulse Rate 91     Resp 18     Temp 98.4 F (36.9 C)     Temp Source Oral     SpO2 95 %     Weight 300 lb (136.1 kg)     Height 5\' 8"  (1.727 m)     Head Circumference      Peak Flow      Pain Score 5     Pain Loc      Pain Edu?      Excl. in GC?    No data  found.  Updated Vital Signs BP 97/64 (BP Location: Left Arm)   Pulse 91   Temp 98.4 F (36.9 C) (Oral)   Resp 18   Ht 5\' 8"  (1.727 m)   Wt 136.1 kg   SpO2 95%   BMI 45.61 kg/m   Physical Exam Constitutional:      General: She is not in acute distress.    Appearance: She is well-developed. She is obese.  HENT:     Head: Normocephalic and atraumatic.  Eyes:     Conjunctiva/sclera: Conjunctivae normal.     Pupils: Pupils are equal, round, and reactive to light.  Cardiovascular:     Rate and Rhythm: Normal rate.  Pulmonary:     Effort: Pulmonary effort is normal. No respiratory distress.  Abdominal:     General: There is no distension.     Palpations: Abdomen is soft.     Tenderness: There is no right CVA tenderness or left CVA tenderness.  Musculoskeletal:        General: Normal range of motion.     Cervical back: Normal range of motion.  Skin:    General: Skin is warm and dry.  Neurological:     Mental Status: She is alert.      UC Treatments / Results  Labs (all labs ordered are listed, but only abnormal results are displayed) Labs Reviewed  POCT URINALYSIS DIP (MANUAL ENTRY) - Abnormal; Notable for the following components:      Result Value   Color, UA other (*)    Protein Ur, POC trace (*)    Urobilinogen, UA 2.0 (*)  All other components within normal limits  URINE CULTURE    EKG   Radiology No results found.  Procedures Procedures (including critical care time)  Medications Ordered in UC Medications - No data to display  Initial Impression / Assessment and Plan / UC Course  I have reviewed the triage vital signs and the nursing notes.  Pertinent labs & imaging results that were available during my care of the patient were reviewed by me and considered in my medical decision making (see chart for details).     Final Clinical Impressions(s) / UC Diagnoses   Final diagnoses:  Lower urinary tract infectious disease  Dysuria      Discharge Instructions      Make sure you are drinking lots of water The antibiotic 2 times a day for 7 days Check MyChart for your urine culture report.  You will be called if any change in antibiotics is indicated Follow-up with your primary care doctor   ED Prescriptions     Medication Sig Dispense Auth. Provider   cephALEXin (KEFLEX) 500 MG capsule Take 1 capsule (500 mg total) by mouth 2 (two) times daily. 14 capsule Raylene Everts, MD      PDMP not reviewed this encounter.   Raylene Everts, MD 01/07/22 1256

## 2022-01-07 NOTE — Discharge Instructions (Signed)
Make sure you are drinking lots of water The antibiotic 2 times a day for 7 days Check MyChart for your urine culture report.  You will be called if any change in antibiotics is indicated Follow-up with your primary care doctor

## 2022-01-08 ENCOUNTER — Telehealth: Payer: Self-pay

## 2022-01-08 LAB — URINE CULTURE: Culture: NO GROWTH

## 2022-01-08 NOTE — Telephone Encounter (Signed)
Call pt to check on status since UC visit. Pt is feeling somewhat improved after starting meds. Advised will call when UCX results come back if abnml. Call if any questions or concerns. F/u with PCP if not improved after abx course is completed.

## 2022-01-10 ENCOUNTER — Other Ambulatory Visit: Payer: Self-pay | Admitting: Medical-Surgical

## 2022-01-10 DIAGNOSIS — Z789 Other specified health status: Secondary | ICD-10-CM

## 2022-01-22 DIAGNOSIS — F649 Gender identity disorder, unspecified: Secondary | ICD-10-CM | POA: Diagnosis not present

## 2022-01-22 DIAGNOSIS — F4310 Post-traumatic stress disorder, unspecified: Secondary | ICD-10-CM | POA: Diagnosis not present

## 2022-01-29 ENCOUNTER — Other Ambulatory Visit: Payer: Self-pay | Admitting: Medical-Surgical

## 2022-01-29 DIAGNOSIS — E7800 Pure hypercholesterolemia, unspecified: Secondary | ICD-10-CM

## 2022-02-15 ENCOUNTER — Other Ambulatory Visit: Payer: Self-pay | Admitting: Medical-Surgical

## 2022-02-21 ENCOUNTER — Other Ambulatory Visit: Payer: Self-pay

## 2022-02-21 MED ORDER — CARIPRAZINE HCL 3 MG PO CAPS: 3.0000 mg | ORAL_CAPSULE | Freq: Every day | ORAL | 3 refills | Status: DC

## 2022-02-21 NOTE — Progress Notes (Signed)
Vraylar 3 mg daily sent to the pharmacy.  This is a 3 mg capsule so only take 1 capsule daily.  Sent in as a 90-day supply to the [facility] [place]. ___________________________________________ Clearnce Sorrel, DNP, APRN, FNP-BC Primary Care and [place]

## 2022-02-21 NOTE — Progress Notes (Signed)
Clair from [place] from [facility] called wanting to know if we can change the prescription from 1.5 MG twice daily which only gave the patient a 15 day supply to 3 MG once daily with a 30 day supply.  I wasn't able to change the Rx to send to the pharmacy.

## 2022-02-22 DIAGNOSIS — F4310 Post-traumatic stress disorder, unspecified: Secondary | ICD-10-CM | POA: Diagnosis not present

## 2022-02-28 ENCOUNTER — Other Ambulatory Visit: Payer: Self-pay

## 2022-02-28 ENCOUNTER — Other Ambulatory Visit: Payer: Self-pay | Admitting: Medical-Surgical

## 2022-02-28 DIAGNOSIS — J0100 Acute maxillary sinusitis, unspecified: Secondary | ICD-10-CM

## 2022-02-28 MED ORDER — BUPROPION HCL ER (SR) 200 MG PO TB12: 200.0000 mg | ORAL_TABLET | Freq: Two times a day (BID) | ORAL | 0 refills | Status: DC

## 2022-02-28 MED ORDER — FLUTICASONE PROPIONATE 50 MCG/ACT NA SUSP: NASAL | 0 refills | Status: DC

## 2022-03-05 ENCOUNTER — Telehealth: Payer: Self-pay | Admitting: General Practice

## 2022-03-05 NOTE — Telephone Encounter (Signed)
Patient was scheduled for medicare wellness visit via telephone today at 10 am. Unable to get in touch with patient x 3. Left voicemail x 3. Patient notified via voicemail regarding no show fee.

## 2022-03-08 DIAGNOSIS — H5203 Hypermetropia, bilateral: Secondary | ICD-10-CM | POA: Diagnosis not present

## 2022-03-12 DIAGNOSIS — F4310 Post-traumatic stress disorder, unspecified: Secondary | ICD-10-CM | POA: Diagnosis not present

## 2022-03-26 ENCOUNTER — Encounter: Payer: Self-pay | Admitting: Medical-Surgical

## 2022-03-26 DIAGNOSIS — F4310 Post-traumatic stress disorder, unspecified: Secondary | ICD-10-CM | POA: Diagnosis not present

## 2022-03-27 ENCOUNTER — Other Ambulatory Visit: Payer: Self-pay | Admitting: Medical-Surgical

## 2022-03-27 MED ORDER — ARIPIPRAZOLE 5 MG PO TABS: 5.0000 mg | ORAL_TABLET | Freq: Every day | ORAL | 1 refills | Status: DC

## 2022-03-28 MED ORDER — FAMOTIDINE 20 MG PO TABS: 20.0000 mg | ORAL_TABLET | Freq: Two times a day (BID) | ORAL | 1 refills | Status: DC

## 2022-03-28 MED ORDER — ESOMEPRAZOLE MAGNESIUM 40 MG PO CPDR: DELAYED_RELEASE_CAPSULE | ORAL | 3 refills | Status: DC

## 2022-03-30 ENCOUNTER — Other Ambulatory Visit: Payer: Self-pay | Admitting: Medical-Surgical

## 2022-03-30 DIAGNOSIS — J0100 Acute maxillary sinusitis, unspecified: Secondary | ICD-10-CM

## 2022-04-09 DIAGNOSIS — F4310 Post-traumatic stress disorder, unspecified: Secondary | ICD-10-CM | POA: Diagnosis not present

## 2022-04-13 DIAGNOSIS — Z20822 Contact with and (suspected) exposure to covid-19: Secondary | ICD-10-CM | POA: Diagnosis not present

## 2022-04-13 DIAGNOSIS — J4521 Mild intermittent asthma with (acute) exacerbation: Secondary | ICD-10-CM | POA: Diagnosis not present

## 2022-04-17 ENCOUNTER — Encounter: Payer: Self-pay | Admitting: Medical-Surgical

## 2022-04-17 ENCOUNTER — Ambulatory Visit (INDEPENDENT_AMBULATORY_CARE_PROVIDER_SITE_OTHER): Payer: Medicare Other | Admitting: Medical-Surgical

## 2022-04-17 VITALS — BP 105/73 | HR 80 | Temp 98.7°F | Ht 68.0 in | Wt 296.3 lb

## 2022-04-17 DIAGNOSIS — J40 Bronchitis, not specified as acute or chronic: Secondary | ICD-10-CM

## 2022-04-17 DIAGNOSIS — L659 Nonscarring hair loss, unspecified: Secondary | ICD-10-CM

## 2022-04-17 DIAGNOSIS — J329 Chronic sinusitis, unspecified: Secondary | ICD-10-CM | POA: Diagnosis not present

## 2022-04-17 DIAGNOSIS — K219 Gastro-esophageal reflux disease without esophagitis: Secondary | ICD-10-CM | POA: Diagnosis not present

## 2022-04-17 MED ORDER — AMOXICILLIN-POT CLAVULANATE 875-125 MG PO TABS: 1.0000 | ORAL_TABLET | Freq: Two times a day (BID) | ORAL | 0 refills | Status: DC

## 2022-04-17 NOTE — Progress Notes (Signed)
        Established patient visit  History, exam, impression, and plan:  1. Sinobronchitis Pleasant 47 year old transgender male presenting today with complaints of 2 weeks of upper respiratory symptoms.  Initially symptoms started with mild cough as well as rhinorrhea.  Suspect that this was related to allergies/pollen.  Use conservative measures at home for management however progressively worsened.  Was seen at [facility] and was tested negative for COVID.  Was provided with a prescription of prednisone, Tessalon Perles, and an albuterol inhaler for as needed use.  Complete the medications as prescribed however symptoms have progressively gotten worse.  Complains of strong cough with chest congestion, sinus congestion, and generalized malaise.  On exam, maxillary and frontal sinus tenderness noted.  Mild posterior oropharyngeal erythema without exudate.  Respirations even, unlabored.  Scattered expiratory wheezing to the bilateral bases posteriorly.  Given length of symptoms, treating with Augmentin twice daily x 10 days.  Continue conservative measures.  2. Gastroesophageal reflux disease without esophagitis Previously well-controlled with Dexilant however insurance has remove this from the formulary.  Is currently using esomeprazole with Pepcid with only minimal relief of symptoms.  Referring to GI for discussion of refractory GERD and the possibility of surgical intervention. - Ambulatory referral to Gastroenterology  3. Hair loss Has started to see hair regrowth with the use of dutasteride.  Is interested in adding oral minoxidil since topical does not seem to work well with her hair type.  Reviewed indications for oral minoxidil.  This will negatively affect blood pressure and with already low readings such as 105/73 today, would like to avoid this.  Interested to see if insurance will cover a wig.  Advised that I will look into this and let her know what I find.   Procedures performed  this visit: None.  Return if symptoms worsen or fail to improve.  __________________________________ Thayer Ohm, DNP, APRN, FNP-BC Primary Care and Sports Medicine [facility] [place]

## 2022-04-23 DIAGNOSIS — F4310 Post-traumatic stress disorder, unspecified: Secondary | ICD-10-CM | POA: Diagnosis not present

## 2022-04-28 ENCOUNTER — Other Ambulatory Visit: Payer: Self-pay | Admitting: Medical-Surgical

## 2022-04-28 DIAGNOSIS — Z789 Other specified health status: Secondary | ICD-10-CM

## 2022-04-28 DIAGNOSIS — J0100 Acute maxillary sinusitis, unspecified: Secondary | ICD-10-CM

## 2022-05-04 DIAGNOSIS — G629 Polyneuropathy, unspecified: Secondary | ICD-10-CM | POA: Diagnosis not present

## 2022-05-04 DIAGNOSIS — I471 Supraventricular tachycardia, unspecified: Secondary | ICD-10-CM | POA: Diagnosis not present

## 2022-05-04 DIAGNOSIS — K589 Irritable bowel syndrome without diarrhea: Secondary | ICD-10-CM | POA: Diagnosis not present

## 2022-05-04 DIAGNOSIS — K219 Gastro-esophageal reflux disease without esophagitis: Secondary | ICD-10-CM | POA: Diagnosis not present

## 2022-05-04 DIAGNOSIS — G250 Essential tremor: Secondary | ICD-10-CM | POA: Diagnosis not present

## 2022-05-04 DIAGNOSIS — F32A Depression, unspecified: Secondary | ICD-10-CM | POA: Diagnosis not present

## 2022-05-04 DIAGNOSIS — J45909 Unspecified asthma, uncomplicated: Secondary | ICD-10-CM | POA: Diagnosis not present

## 2022-05-04 DIAGNOSIS — E785 Hyperlipidemia, unspecified: Secondary | ICD-10-CM | POA: Diagnosis not present

## 2022-05-04 DIAGNOSIS — N6489 Other specified disorders of breast: Secondary | ICD-10-CM | POA: Diagnosis not present

## 2022-05-04 DIAGNOSIS — M797 Fibromyalgia: Secondary | ICD-10-CM | POA: Diagnosis not present

## 2022-05-04 DIAGNOSIS — F640 Transsexualism: Secondary | ICD-10-CM | POA: Diagnosis not present

## 2022-05-04 DIAGNOSIS — R000 Tachycardia, unspecified: Secondary | ICD-10-CM | POA: Diagnosis not present

## 2022-05-04 DIAGNOSIS — F4310 Post-traumatic stress disorder, unspecified: Secondary | ICD-10-CM | POA: Diagnosis not present

## 2022-05-04 DIAGNOSIS — I10 Essential (primary) hypertension: Secondary | ICD-10-CM | POA: Diagnosis not present

## 2022-05-04 DIAGNOSIS — F419 Anxiety disorder, unspecified: Secondary | ICD-10-CM | POA: Diagnosis not present

## 2022-05-04 DIAGNOSIS — F649 Gender identity disorder, unspecified: Secondary | ICD-10-CM | POA: Diagnosis not present

## 2022-05-04 DIAGNOSIS — Z1152 Encounter for screening for COVID-19: Secondary | ICD-10-CM | POA: Diagnosis not present

## 2022-05-04 HISTORY — PX: MASTOPEXY: SUR857

## 2022-05-05 DIAGNOSIS — K589 Irritable bowel syndrome without diarrhea: Secondary | ICD-10-CM | POA: Diagnosis not present

## 2022-05-05 DIAGNOSIS — Z1152 Encounter for screening for COVID-19: Secondary | ICD-10-CM | POA: Diagnosis not present

## 2022-05-05 DIAGNOSIS — I10 Essential (primary) hypertension: Secondary | ICD-10-CM | POA: Diagnosis not present

## 2022-05-05 DIAGNOSIS — F419 Anxiety disorder, unspecified: Secondary | ICD-10-CM | POA: Diagnosis not present

## 2022-05-05 DIAGNOSIS — J45909 Unspecified asthma, uncomplicated: Secondary | ICD-10-CM | POA: Diagnosis not present

## 2022-05-05 DIAGNOSIS — I471 Supraventricular tachycardia, unspecified: Secondary | ICD-10-CM | POA: Diagnosis not present

## 2022-05-05 DIAGNOSIS — F4310 Post-traumatic stress disorder, unspecified: Secondary | ICD-10-CM | POA: Diagnosis not present

## 2022-05-05 DIAGNOSIS — R000 Tachycardia, unspecified: Secondary | ICD-10-CM | POA: Diagnosis not present

## 2022-05-05 DIAGNOSIS — M797 Fibromyalgia: Secondary | ICD-10-CM | POA: Diagnosis not present

## 2022-05-05 DIAGNOSIS — F32A Depression, unspecified: Secondary | ICD-10-CM | POA: Diagnosis not present

## 2022-05-05 DIAGNOSIS — K219 Gastro-esophageal reflux disease without esophagitis: Secondary | ICD-10-CM | POA: Diagnosis not present

## 2022-05-05 DIAGNOSIS — G629 Polyneuropathy, unspecified: Secondary | ICD-10-CM | POA: Diagnosis not present

## 2022-05-05 DIAGNOSIS — N6489 Other specified disorders of breast: Secondary | ICD-10-CM | POA: Diagnosis not present

## 2022-05-05 DIAGNOSIS — G250 Essential tremor: Secondary | ICD-10-CM | POA: Diagnosis not present

## 2022-05-05 DIAGNOSIS — E785 Hyperlipidemia, unspecified: Secondary | ICD-10-CM | POA: Diagnosis not present

## 2022-05-05 DIAGNOSIS — F640 Transsexualism: Secondary | ICD-10-CM | POA: Diagnosis not present

## 2022-05-08 ENCOUNTER — Telehealth: Payer: Self-pay | Admitting: Medical-Surgical

## 2022-05-08 MED ORDER — CHOLESTYRAMINE 4 G PO PACK: PACK | ORAL | 1 refills | Status: AC

## 2022-05-08 NOTE — Telephone Encounter (Signed)
Patient called requesting a refill of cholestyramine (QUESTRAN) 4 g packet [[id number]]   Pharmacy -  [facility] - [place], [place] - [address] [place], [place] [address]

## 2022-05-15 DIAGNOSIS — F4310 Post-traumatic stress disorder, unspecified: Secondary | ICD-10-CM | POA: Diagnosis not present

## 2022-05-17 ENCOUNTER — Ambulatory Visit (INDEPENDENT_AMBULATORY_CARE_PROVIDER_SITE_OTHER): Payer: Medicare Other

## 2022-05-17 ENCOUNTER — Encounter: Payer: Self-pay | Admitting: Medical-Surgical

## 2022-05-17 ENCOUNTER — Ambulatory Visit (INDEPENDENT_AMBULATORY_CARE_PROVIDER_SITE_OTHER): Payer: Medicare Other | Admitting: Medical-Surgical

## 2022-05-17 VITALS — BP 90/60 | HR 83 | Resp 20 | Ht 68.0 in | Wt 297.8 lb

## 2022-05-17 DIAGNOSIS — M79671 Pain in right foot: Secondary | ICD-10-CM

## 2022-05-17 DIAGNOSIS — F39 Unspecified mood [affective] disorder: Secondary | ICD-10-CM | POA: Diagnosis not present

## 2022-05-17 DIAGNOSIS — M25532 Pain in left wrist: Secondary | ICD-10-CM | POA: Diagnosis not present

## 2022-05-17 DIAGNOSIS — F4010 Social phobia, unspecified: Secondary | ICD-10-CM

## 2022-05-17 DIAGNOSIS — M7731 Calcaneal spur, right foot: Secondary | ICD-10-CM | POA: Diagnosis not present

## 2022-05-17 DIAGNOSIS — L989 Disorder of the skin and subcutaneous tissue, unspecified: Secondary | ICD-10-CM

## 2022-05-17 MED ORDER — ATOMOXETINE HCL 60 MG PO CAPS: 60.0000 mg | ORAL_CAPSULE | Freq: Every morning | ORAL | 3 refills | Status: DC

## 2022-05-17 MED ORDER — DICYCLOMINE HCL 20 MG PO TABS: 20.0000 mg | ORAL_TABLET | Freq: Four times a day (QID) | ORAL | 0 refills | Status: DC | PRN

## 2022-05-17 NOTE — Progress Notes (Signed)
        Established patient visit  History, exam, impression, and plan:  1. Social anxiety disorder 2. Mood disorder (HCC)- mixed anxiety and depression Pleasant 47 year old transgender male presenting today to follow-up on mood concerns.  Previously managed by psychiatry but has not been able to find one recently.  We have been managing her mental health medications and she was doing well for a while but unfortunately had a break-up with her significant other after a 4-year relationship and feels that she is having a hard time coping.  Currently on amitriptyline 25 mg nightly, Abilify 5 mg daily, Wellbutrin 200 mg twice daily, and Cymbalta 60 mg daily.  Tolerating all medications well without side effects.  Denies SI/HI.  Requesting a referral to help get her connected with a new psychiatrist.  Referral placed. - Ambulatory referral to Psychiatry  3. Left wrist pain Had a fall while outside in the yard a couple of days ago.  Notes the ground was wet and she slipped was unable to catch herself.  Landed on her left wrist which has been painful and swollen since then.  Did not seek immediate evaluation.  Able to flex and extend fingers.  Unable to flex or extend the left wrist.  Swelling noted to the left forearm, left wrist, and left hand/fingers.  Temperature and pulses normal.  Getting x-rays today.  Preliminary review of left wrist x-rays does not show a fracture.  Supportive wrist brace placed on patient with instructions to wear continuously, okay to remove for bathing.  Treat with anti-inflammatories/Tylenol as needed.  Recommend using ice/cool compresses 3-4 times daily for up to 15 minutes each to help with swelling and discomfort.  If no improvement with these measures in 1 week, return for follow-up with Dr. Benjamin Stain here in our office. - DG Wrist Complete Left; Future  4. Right foot pain Has been having some right foot pain in the plantar surface.  In the arch of the foot, has a nodule  that gets enlarged at times and is very painful to walk on.  Today the nodule has gone down his size and has not excessively tender.  Interested in having this evaluated.  Ordering right foot x-rays today.  Recommend using supportive shoes and consider using insoles or padding to help offload pressure on the affected area.  Anti-inflammatories as noted above may also provide relief. - DG Foot Complete Right; Future  5. Skin lesion Has questions about a skin lesion that is been present on her left forearm for the last year to year and a half.  The lesion is a patch of darkened skin that seems to grow hair more than the surrounding skin.  Has a family history of skin cancer and would like this evaluated.  See below for clinical photo.  Unclear etiology.  Discussed possible skin biopsy or referral to dermatology.    Procedures performed this visit: None.  Return if symptoms worsen or fail to improve.  __________________________________ Thayer Ohm, DNP, APRN, FNP-BC Primary Care and Sports Medicine [facility] [place]

## 2022-05-18 ENCOUNTER — Encounter: Payer: Self-pay | Admitting: Medical-Surgical

## 2022-05-21 ENCOUNTER — Ambulatory Visit: Payer: Medicare Other | Admitting: Medical-Surgical

## 2022-05-21 ENCOUNTER — Other Ambulatory Visit: Payer: Self-pay | Admitting: Medical-Surgical

## 2022-05-24 ENCOUNTER — Other Ambulatory Visit: Payer: Self-pay

## 2022-05-24 ENCOUNTER — Other Ambulatory Visit: Payer: Self-pay | Admitting: Medical-Surgical

## 2022-05-24 DIAGNOSIS — Z789 Other specified health status: Secondary | ICD-10-CM

## 2022-05-24 MED ORDER — BUPROPION HCL ER (SR) 200 MG PO TB12: 200.0000 mg | ORAL_TABLET | Freq: Two times a day (BID) | ORAL | 1 refills | Status: DC

## 2022-05-29 ENCOUNTER — Other Ambulatory Visit: Payer: Self-pay | Admitting: Medical-Surgical

## 2022-05-29 DIAGNOSIS — J0100 Acute maxillary sinusitis, unspecified: Secondary | ICD-10-CM

## 2022-05-29 DIAGNOSIS — F4310 Post-traumatic stress disorder, unspecified: Secondary | ICD-10-CM | POA: Diagnosis not present

## 2022-06-05 ENCOUNTER — Other Ambulatory Visit: Payer: Self-pay | Admitting: Medical-Surgical

## 2022-06-12 DIAGNOSIS — F4310 Post-traumatic stress disorder, unspecified: Secondary | ICD-10-CM | POA: Diagnosis not present

## 2022-06-12 DIAGNOSIS — K589 Irritable bowel syndrome without diarrhea: Secondary | ICD-10-CM | POA: Diagnosis not present

## 2022-06-12 DIAGNOSIS — F319 Bipolar disorder, unspecified: Secondary | ICD-10-CM | POA: Diagnosis not present

## 2022-06-24 ENCOUNTER — Other Ambulatory Visit: Payer: Self-pay | Admitting: Medical-Surgical

## 2022-06-25 DIAGNOSIS — F4310 Post-traumatic stress disorder, unspecified: Secondary | ICD-10-CM | POA: Diagnosis not present

## 2022-06-28 ENCOUNTER — Other Ambulatory Visit: Payer: Self-pay | Admitting: Medical-Surgical

## 2022-07-19 ENCOUNTER — Other Ambulatory Visit: Payer: Self-pay | Admitting: Medical-Surgical

## 2022-07-24 ENCOUNTER — Other Ambulatory Visit: Payer: Self-pay | Admitting: Medical-Surgical

## 2022-07-24 DIAGNOSIS — F4310 Post-traumatic stress disorder, unspecified: Secondary | ICD-10-CM | POA: Diagnosis not present

## 2022-07-25 MED ORDER — DICYCLOMINE HCL 20 MG PO TABS: 20.0000 mg | ORAL_TABLET | Freq: Four times a day (QID) | ORAL | 1 refills | Status: DC | PRN

## 2022-07-28 ENCOUNTER — Other Ambulatory Visit: Payer: Self-pay | Admitting: Medical-Surgical

## 2022-07-28 DIAGNOSIS — Z789 Other specified health status: Secondary | ICD-10-CM

## 2022-07-28 DIAGNOSIS — E7800 Pure hypercholesterolemia, unspecified: Secondary | ICD-10-CM

## 2022-07-28 DIAGNOSIS — J0100 Acute maxillary sinusitis, unspecified: Secondary | ICD-10-CM

## 2022-07-30 DIAGNOSIS — F649 Gender identity disorder, unspecified: Secondary | ICD-10-CM | POA: Diagnosis not present

## 2022-07-30 DIAGNOSIS — Z01818 Encounter for other preprocedural examination: Secondary | ICD-10-CM | POA: Diagnosis not present

## 2022-08-02 DIAGNOSIS — F4310 Post-traumatic stress disorder, unspecified: Secondary | ICD-10-CM | POA: Diagnosis not present

## 2022-08-12 ENCOUNTER — Other Ambulatory Visit: Payer: Self-pay | Admitting: Medical-Surgical

## 2022-08-14 DIAGNOSIS — F4310 Post-traumatic stress disorder, unspecified: Secondary | ICD-10-CM | POA: Diagnosis not present

## 2022-08-27 ENCOUNTER — Other Ambulatory Visit: Payer: Self-pay | Admitting: Medical-Surgical

## 2022-08-27 DIAGNOSIS — Z789 Other specified health status: Secondary | ICD-10-CM

## 2022-08-27 DIAGNOSIS — F4310 Post-traumatic stress disorder, unspecified: Secondary | ICD-10-CM | POA: Diagnosis not present

## 2022-08-27 DIAGNOSIS — J0100 Acute maxillary sinusitis, unspecified: Secondary | ICD-10-CM

## 2022-08-28 DIAGNOSIS — Z7985 Long-term (current) use of injectable non-insulin antidiabetic drugs: Secondary | ICD-10-CM | POA: Diagnosis not present

## 2022-08-28 DIAGNOSIS — J45909 Unspecified asthma, uncomplicated: Secondary | ICD-10-CM | POA: Diagnosis not present

## 2022-08-28 DIAGNOSIS — M797 Fibromyalgia: Secondary | ICD-10-CM | POA: Diagnosis not present

## 2022-08-28 DIAGNOSIS — Z6841 Body Mass Index (BMI) 40.0 and over, adult: Secondary | ICD-10-CM | POA: Diagnosis not present

## 2022-08-28 DIAGNOSIS — I10 Essential (primary) hypertension: Secondary | ICD-10-CM | POA: Diagnosis not present

## 2022-08-28 DIAGNOSIS — Z87891 Personal history of nicotine dependence: Secondary | ICD-10-CM | POA: Diagnosis not present

## 2022-08-28 DIAGNOSIS — G629 Polyneuropathy, unspecified: Secondary | ICD-10-CM | POA: Diagnosis not present

## 2022-08-28 DIAGNOSIS — Z79899 Other long term (current) drug therapy: Secondary | ICD-10-CM | POA: Diagnosis not present

## 2022-08-28 DIAGNOSIS — Z882 Allergy status to sulfonamides status: Secondary | ICD-10-CM | POA: Diagnosis not present

## 2022-08-28 DIAGNOSIS — I471 Supraventricular tachycardia, unspecified: Secondary | ICD-10-CM | POA: Diagnosis not present

## 2022-08-28 DIAGNOSIS — F649 Gender identity disorder, unspecified: Secondary | ICD-10-CM | POA: Diagnosis not present

## 2022-08-28 DIAGNOSIS — G4730 Sleep apnea, unspecified: Secondary | ICD-10-CM | POA: Diagnosis not present

## 2022-08-28 DIAGNOSIS — E785 Hyperlipidemia, unspecified: Secondary | ICD-10-CM | POA: Diagnosis not present

## 2022-08-28 DIAGNOSIS — K219 Gastro-esophageal reflux disease without esophagitis: Secondary | ICD-10-CM | POA: Diagnosis not present

## 2022-08-28 DIAGNOSIS — Z5941 Food insecurity: Secondary | ICD-10-CM | POA: Diagnosis not present

## 2022-08-28 HISTORY — PX: OTHER SURGICAL HISTORY: SHX169

## 2022-08-29 DIAGNOSIS — Z87891 Personal history of nicotine dependence: Secondary | ICD-10-CM | POA: Diagnosis not present

## 2022-08-29 DIAGNOSIS — G4730 Sleep apnea, unspecified: Secondary | ICD-10-CM | POA: Diagnosis not present

## 2022-08-29 DIAGNOSIS — Z882 Allergy status to sulfonamides status: Secondary | ICD-10-CM | POA: Diagnosis not present

## 2022-08-29 DIAGNOSIS — Z7985 Long-term (current) use of injectable non-insulin antidiabetic drugs: Secondary | ICD-10-CM | POA: Diagnosis not present

## 2022-08-29 DIAGNOSIS — Z6841 Body Mass Index (BMI) 40.0 and over, adult: Secondary | ICD-10-CM | POA: Diagnosis not present

## 2022-08-29 DIAGNOSIS — Z79899 Other long term (current) drug therapy: Secondary | ICD-10-CM | POA: Diagnosis not present

## 2022-08-29 DIAGNOSIS — Z5941 Food insecurity: Secondary | ICD-10-CM | POA: Diagnosis not present

## 2022-08-29 DIAGNOSIS — J45909 Unspecified asthma, uncomplicated: Secondary | ICD-10-CM | POA: Diagnosis not present

## 2022-08-29 DIAGNOSIS — E785 Hyperlipidemia, unspecified: Secondary | ICD-10-CM | POA: Diagnosis not present

## 2022-08-29 DIAGNOSIS — M797 Fibromyalgia: Secondary | ICD-10-CM | POA: Diagnosis not present

## 2022-08-29 DIAGNOSIS — I10 Essential (primary) hypertension: Secondary | ICD-10-CM | POA: Diagnosis not present

## 2022-08-29 DIAGNOSIS — K219 Gastro-esophageal reflux disease without esophagitis: Secondary | ICD-10-CM | POA: Diagnosis not present

## 2022-08-29 DIAGNOSIS — I471 Supraventricular tachycardia, unspecified: Secondary | ICD-10-CM | POA: Diagnosis not present

## 2022-08-29 DIAGNOSIS — F649 Gender identity disorder, unspecified: Secondary | ICD-10-CM | POA: Diagnosis not present

## 2022-08-29 DIAGNOSIS — G629 Polyneuropathy, unspecified: Secondary | ICD-10-CM | POA: Diagnosis not present

## 2022-09-10 DIAGNOSIS — F4310 Post-traumatic stress disorder, unspecified: Secondary | ICD-10-CM | POA: Diagnosis not present

## 2022-09-14 ENCOUNTER — Other Ambulatory Visit: Payer: Self-pay | Admitting: Medical-Surgical

## 2022-09-14 ENCOUNTER — Encounter: Payer: Self-pay | Admitting: Medical-Surgical

## 2022-09-14 DIAGNOSIS — J0100 Acute maxillary sinusitis, unspecified: Secondary | ICD-10-CM

## 2022-09-14 DIAGNOSIS — Z789 Other specified health status: Secondary | ICD-10-CM

## 2022-09-14 DIAGNOSIS — E7800 Pure hypercholesterolemia, unspecified: Secondary | ICD-10-CM

## 2022-09-14 NOTE — Telephone Encounter (Signed)
Requesting rx rf of  Fenofibrate 160 last written 07/31/2022 Lisinopril 5mg  last written 07/31/2022 Spironoloactone last written 07/31/2022 Glycopyrolate last written 08/13/2022 Progesterone last written 08/27/2022 Amitryptyline last written 08/27/2022 Primidone 08/27/2022 Duloxetine lasat writtne 08/27/2022 Atenolol last written 08/27/2022 Fluticasone last written 08/27/2022 Last OV 05/17/2022 No upcoing appt schld.

## 2022-09-17 NOTE — Telephone Encounter (Signed)
Requested refills too soon

## 2022-09-25 ENCOUNTER — Other Ambulatory Visit: Payer: Self-pay | Admitting: Medical-Surgical

## 2022-09-25 DIAGNOSIS — F4310 Post-traumatic stress disorder, unspecified: Secondary | ICD-10-CM | POA: Diagnosis not present

## 2022-09-26 ENCOUNTER — Other Ambulatory Visit: Payer: Self-pay | Admitting: Medical-Surgical

## 2022-09-26 DIAGNOSIS — E7800 Pure hypercholesterolemia, unspecified: Secondary | ICD-10-CM

## 2022-09-26 DIAGNOSIS — Z789 Other specified health status: Secondary | ICD-10-CM

## 2022-09-28 ENCOUNTER — Encounter: Payer: Self-pay | Admitting: Medical-Surgical

## 2022-09-28 ENCOUNTER — Ambulatory Visit (INDEPENDENT_AMBULATORY_CARE_PROVIDER_SITE_OTHER): Payer: Medicare Other | Admitting: Medical-Surgical

## 2022-09-28 VITALS — BP 106/70 | HR 89 | Resp 20 | Ht 68.0 in | Wt 309.9 lb

## 2022-09-28 DIAGNOSIS — F988 Other specified behavioral and emotional disorders with onset usually occurring in childhood and adolescence: Secondary | ICD-10-CM | POA: Diagnosis not present

## 2022-09-28 DIAGNOSIS — R7303 Prediabetes: Secondary | ICD-10-CM

## 2022-09-28 DIAGNOSIS — M797 Fibromyalgia: Secondary | ICD-10-CM

## 2022-09-28 DIAGNOSIS — K219 Gastro-esophageal reflux disease without esophagitis: Secondary | ICD-10-CM

## 2022-09-28 DIAGNOSIS — F39 Unspecified mood [affective] disorder: Secondary | ICD-10-CM | POA: Diagnosis not present

## 2022-09-28 DIAGNOSIS — Z789 Other specified health status: Secondary | ICD-10-CM

## 2022-09-28 DIAGNOSIS — Z23 Encounter for immunization: Secondary | ICD-10-CM

## 2022-09-28 DIAGNOSIS — E7800 Pure hypercholesterolemia, unspecified: Secondary | ICD-10-CM

## 2022-09-28 DIAGNOSIS — I10 Essential (primary) hypertension: Secondary | ICD-10-CM

## 2022-09-28 MED ORDER — DICYCLOMINE HCL 20 MG PO TABS: 20.0000 mg | ORAL_TABLET | Freq: Four times a day (QID) | ORAL | 3 refills | Status: AC | PRN

## 2022-09-28 MED ORDER — ESOMEPRAZOLE MAGNESIUM 40 MG PO CPDR: DELAYED_RELEASE_CAPSULE | ORAL | 3 refills | Status: DC

## 2022-09-28 MED ORDER — DUTASTERIDE 0.5 MG PO CAPS: 0.5000 mg | ORAL_CAPSULE | Freq: Every day | ORAL | 3 refills | Status: DC

## 2022-09-28 MED ORDER — DULOXETINE HCL 60 MG PO CPEP: 60.0000 mg | ORAL_CAPSULE | Freq: Every day | ORAL | 3 refills | Status: DC

## 2022-09-28 MED ORDER — GLYCOPYRROLATE 1 MG PO TABS: 2.0000 mg | ORAL_TABLET | Freq: Two times a day (BID) | ORAL | 3 refills | Status: DC

## 2022-09-28 MED ORDER — LISINOPRIL 5 MG PO TABS: 5.0000 mg | ORAL_TABLET | Freq: Every day | ORAL | 3 refills | Status: DC

## 2022-09-28 MED ORDER — ARIPIPRAZOLE 5 MG PO TABS: 5.0000 mg | ORAL_TABLET | Freq: Every day | ORAL | 3 refills | Status: DC

## 2022-09-28 MED ORDER — ATENOLOL 50 MG PO TABS: ORAL_TABLET | ORAL | 3 refills | Status: DC

## 2022-09-28 MED ORDER — ATORVASTATIN CALCIUM 10 MG PO TABS: 10.0000 mg | ORAL_TABLET | Freq: Every day | ORAL | 3 refills | Status: DC

## 2022-09-28 MED ORDER — TOPIRAMATE 50 MG PO TABS: ORAL_TABLET | ORAL | 3 refills | Status: DC

## 2022-09-28 MED ORDER — FAMOTIDINE 20 MG PO TABS: 20.0000 mg | ORAL_TABLET | Freq: Two times a day (BID) | ORAL | 1 refills | Status: DC

## 2022-09-28 MED ORDER — BUPROPION HCL ER (SR) 200 MG PO TB12: 200.0000 mg | ORAL_TABLET | Freq: Two times a day (BID) | ORAL | 3 refills | Status: DC

## 2022-09-28 MED ORDER — PROGESTERONE MICRONIZED 100 MG PO CAPS: 100.0000 mg | ORAL_CAPSULE | Freq: Every day | ORAL | 3 refills | Status: DC

## 2022-09-28 MED ORDER — PRIMIDONE 50 MG PO TABS: ORAL_TABLET | ORAL | 3 refills | Status: DC

## 2022-09-28 MED ORDER — ESTRADIOL 2 MG PO TABS: 2.0000 mg | ORAL_TABLET | Freq: Two times a day (BID) | ORAL | 0 refills | Status: DC

## 2022-09-28 MED ORDER — CYCLOBENZAPRINE HCL 10 MG PO TABS: 10.0000 mg | ORAL_TABLET | Freq: Three times a day (TID) | ORAL | 3 refills | Status: DC | PRN

## 2022-09-28 MED ORDER — SPIRONOLACTONE 50 MG PO TABS: 50.0000 mg | ORAL_TABLET | Freq: Two times a day (BID) | ORAL | 3 refills | Status: DC

## 2022-09-28 MED ORDER — FENOFIBRATE 160 MG PO TABS: ORAL_TABLET | ORAL | 3 refills | Status: DC

## 2022-09-28 MED ORDER — AMITRIPTYLINE HCL 25 MG PO TABS: 25.0000 mg | ORAL_TABLET | Freq: Every day | ORAL | 3 refills | Status: DC

## 2022-09-28 NOTE — Progress Notes (Unsigned)
        Established patient visit  History, exam, impression, and plan:  No problem-specific Assessment & Plan notes found for this encounter.   Procedures performed this visit: None.  No follow-ups on file.  __________________________________ Thayer Ohm, DNP, APRN, FNP-BC Primary Care and Sports Medicine [facility] [place] [facility]

## 2022-09-29 ENCOUNTER — Encounter: Payer: Self-pay | Admitting: Medical-Surgical

## 2022-09-29 DIAGNOSIS — F988 Other specified behavioral and emotional disorders with onset usually occurring in childhood and adolescence: Secondary | ICD-10-CM | POA: Insufficient documentation

## 2022-10-01 DIAGNOSIS — Z9882 Breast implant status: Secondary | ICD-10-CM | POA: Diagnosis not present

## 2022-10-01 DIAGNOSIS — N61 Mastitis without abscess: Secondary | ICD-10-CM | POA: Diagnosis not present

## 2022-10-01 DIAGNOSIS — N6489 Other specified disorders of breast: Secondary | ICD-10-CM | POA: Diagnosis not present

## 2022-10-02 ENCOUNTER — Telehealth: Payer: Self-pay | Admitting: *Deleted

## 2022-10-02 ENCOUNTER — Ambulatory Visit (INDEPENDENT_AMBULATORY_CARE_PROVIDER_SITE_OTHER): Payer: Medicare Other | Admitting: Medical-Surgical

## 2022-10-02 DIAGNOSIS — Z0000 Encounter for general adult medical examination without abnormal findings: Secondary | ICD-10-CM

## 2022-10-02 DIAGNOSIS — Z1211 Encounter for screening for malignant neoplasm of colon: Secondary | ICD-10-CM

## 2022-10-02 NOTE — Patient Instructions (Addendum)
MEDICARE ANNUAL WELLNESS VISIT Health Maintenance Summary and Written Plan of Care  [name] ,  Thank you for allowing me to perform your Medicare Annual Wellness Visit and for your ongoing commitment to your health.   Health Maintenance & Immunization History Health Maintenance  Topic Date Due   Colonoscopy  05/17/2023 (Originally 02/11/2022)   Hepatitis C Screening  09/28/2023 (Originally 02/12/1995)   HIV Screening  09/28/2023 (Originally 02/12/1992)   Medicare Annual Wellness (AWV)  10/02/2023   DTaP/Tdap/Td (2 - Td or Tdap) 04/13/2031   INFLUENZA VACCINE  Completed   COVID-19 Vaccine  Completed   HPV VACCINES  Aged Out   Immunization History  Administered Date(s) Administered   Influenza, Seasonal, Injecte, Preservative Fre 09/28/2022   Moderna Sars-Covid-2 Vaccination 08/11/2019, 09/05/2019   Pfizer(Comirnaty)Fall Seasonal Vaccine 12 years and older 09/28/2022   Tdap 04/12/2021    These are the patient goals that we discussed:  Goals Addressed               This Visit's Progress     Patient Stated (pt-stated)        Patient stated that she would like to loose weight.         This is a list of Health Maintenance Items that are overdue or due now: Colorectal cancer screening  Orders/Referrals Placed Today: Orders Placed This Encounter  Procedures   AMB Referral to Community Care Coordinaton ([facility] Patients)    Referral Priority:   Routine    Referral Type:   Consultation    Referral Reason:   Care Coordination    Number of Visits Requested:   1   Ambulatory referral to Gastroenterology (for Colonoscopy)    Referral Priority:   Routine    Referral Type:   Consultation    Referral Reason:   Specialty Services Required    Number of Visits Requested:   1   (Contact our referral department at [phone] if you have not spoken with someone about your referral appointment within the next 5 days)    Follow-up Plan Follow-up with Christen Butter, NP as  planned Medicare wellness visit in one year.  Patient will access AVS on my chart.      Health Maintenance, Male Adopting a healthy lifestyle and getting preventive care are important in promoting health and wellness. Ask your health care provider about: The right schedule for you to have regular tests and exams. Things you can do on your own to prevent diseases and keep yourself healthy. What should I know about diet, weight, and exercise? Eat a healthy diet  Eat a diet that includes plenty of vegetables, fruits, low-fat dairy products, and lean protein. Do not eat a lot of foods that are high in solid fats, added sugars, or sodium. Maintain a healthy weight Body mass index (BMI) is used to identify weight problems. It estimates body fat based on height and weight. Your health care provider can help determine your BMI and help you achieve or maintain a healthy weight. Get regular exercise Get regular exercise. This is one of the most important things you can do for your health. Most adults should: Exercise for at least 150 minutes each week. The exercise should increase your heart rate and make you sweat (moderate-intensity exercise). Do strengthening exercises at least twice a week. This is in addition to the moderate-intensity exercise. Spend less time sitting. Even light physical activity can be beneficial. Watch cholesterol and blood lipids Have your blood tested for lipids and  cholesterol at 45 years of age, then have this test every 5 years. Have your cholesterol levels checked more often if: Your lipid or cholesterol levels are high. You are older than 45 years of age. You are at high risk for heart disease. What should I know about cancer screening? Depending on your health history and family history, you may need to have cancer screening at various ages. This may include screening for: Breast cancer. Cervical cancer. Colorectal cancer. Skin cancer. Lung cancer. What  should I know about heart disease, diabetes, and high blood pressure? Blood pressure and heart disease High blood pressure causes heart disease and increases the risk of stroke. This is more likely to develop in people who have high blood pressure readings or are overweight. Have your blood pressure checked: Every 3-5 years if you are 35-10 years of age. Every year if you are 12 years old or older. Diabetes Have regular diabetes screenings. This checks your fasting blood sugar level. Have the screening done: Once every three years after age 18 if you are at a normal weight and have a low risk for diabetes. More often and at a younger age if you are overweight or have a high risk for diabetes. What should I know about preventing infection? Hepatitis B If you have a higher risk for hepatitis B, you should be screened for this virus. Talk with your health care provider to find out if you are at risk for hepatitis B infection. Hepatitis C Testing is recommended for: Everyone born from [birth year] through 1963. Anyone with known risk factors for hepatitis C. Sexually transmitted infections (STIs) Get screened for STIs, including gonorrhea and chlamydia, if: You are sexually active and are younger than 45 years of age. You are older than 45 years of age and your health care provider tells you that you are at risk for this type of infection. Your sexual activity has changed since you were last screened, and you are at increased risk for chlamydia or gonorrhea. Ask your health care provider if you are at risk. Ask your health care provider about whether you are at high risk for HIV. Your health care provider may recommend a prescription medicine to help prevent HIV infection. If you choose to take medicine to prevent HIV, you should first get tested for HIV. You should then be tested every 3 months for as long as you are taking the medicine. Pregnancy If you are about to stop having your period  (premenopausal) and you may become pregnant, seek counseling before you get pregnant. Take 400 to 800 micrograms (mcg) of folic acid every day if you become pregnant. Ask for birth control (contraception) if you want to prevent pregnancy. Osteoporosis and menopause Osteoporosis is a disease in which the bones lose minerals and strength with aging. This can result in bone fractures. If you are 72 years old or older, or if you are at risk for osteoporosis and fractures, ask your health care provider if you should: Be screened for bone loss. Take a calcium or vitamin D supplement to lower your risk of fractures. Be given hormone replacement therapy (HRT) to treat symptoms of menopause. Follow these instructions at home: Alcohol use Do not drink alcohol if: Your health care provider tells you not to drink. You are pregnant, may be pregnant, or are planning to become pregnant. If you drink alcohol: Limit how much you have to: 0-1 drink a day. Know how much alcohol is in your drink. In the [place]  one drink equals one 12 oz bottle of beer (355 mL), one 5 oz glass of wine (148 mL), or one 1 oz glass of hard liquor (44 mL). Lifestyle Do not use any products that contain nicotine or tobacco. These products include cigarettes, chewing tobacco, and vaping devices, such as e-cigarettes. If you need help quitting, ask your health care provider. Do not use street drugs. Do not share needles. Ask your health care provider for help if you need support or information about quitting drugs. General instructions Schedule regular health, dental, and eye exams. Stay current with your vaccines. Tell your health care provider if: You often feel depressed. You have ever been abused or do not feel safe at home. Summary Adopting a healthy lifestyle and getting preventive care are important in promoting health and wellness. Follow your health care provider's instructions about healthy diet, exercising, and getting  tested or screened for diseases. Follow your health care provider's instructions on monitoring your cholesterol and blood pressure. This information is not intended to replace advice given to you by your health care provider. Make sure you discuss any questions you have with your health care provider. Document Revised: 05/09/2020 Document Reviewed: 05/09/2020 Elsevier Patient Education  2024 [employer]  Health Maintenance, Male Adopting a healthy lifestyle and getting preventive care are important in promoting health and wellness. Ask your health care provider about: The right schedule for you to have regular tests and exams. Things you can do on your own to prevent diseases and keep yourself healthy. What should I know about diet, weight, and exercise? Eat a healthy diet  Eat a diet that includes plenty of vegetables, fruits, low-fat dairy products, and lean protein. Do not eat a lot of foods that are high in solid fats, added sugars, or sodium. Maintain a healthy weight Body mass index (BMI) is a measurement that can be used to identify possible weight problems. It estimates body fat based on height and weight. Your health care provider can help determine your BMI and help you achieve or maintain a healthy weight. Get regular exercise Get regular exercise. This is one of the most important things you can do for your health. Most adults should: Exercise for at least 150 minutes each week. The exercise should increase your heart rate and make you sweat (moderate-intensity exercise). Do strengthening exercises at least twice a week. This is in addition to the moderate-intensity exercise. Spend less time sitting. Even light physical activity can be beneficial. Watch cholesterol and blood lipids Have your blood tested for lipids and cholesterol at 45 years of age, then have this test every 5 years. You may need to have your cholesterol levels checked more often if: Your lipid or cholesterol  levels are high. You are older than 45 years of age. You are at high risk for heart disease. What should I know about cancer screening? Many types of cancers can be detected early and may often be prevented. Depending on your health history and family history, you may need to have cancer screening at various ages. This may include screening for: Colorectal cancer. Prostate cancer. Skin cancer. Lung cancer. What should I know about heart disease, diabetes, and high blood pressure? Blood pressure and heart disease High blood pressure causes heart disease and increases the risk of stroke. This is more likely to develop in people who have high blood pressure readings or are overweight. Talk with your health care provider about your target blood pressure readings. Have your blood pressure  checked: Every 3-5 years if you are 47-54 years of age. Every year if you are 71 years old or older. If you are between the ages of 24 and 29 and are a current or former smoker, ask your health care provider if you should have a one-time screening for abdominal aortic aneurysm (AAA). Diabetes Have regular diabetes screenings. This checks your fasting blood sugar level. Have the screening done: Once every three years after age 51 if you are at a normal weight and have a low risk for diabetes. More often and at a younger age if you are overweight or have a high risk for diabetes. What should I know about preventing infection? Hepatitis B If you have a higher risk for hepatitis B, you should be screened for this virus. Talk with your health care provider to find out if you are at risk for hepatitis B infection. Hepatitis C Blood testing is recommended for: Everyone born from [birth year] through 1963. Anyone with known risk factors for hepatitis C. Sexually transmitted infections (STIs) You should be screened each year for STIs, including gonorrhea and chlamydia, if: You are sexually active and are younger than 45  years of age. You are older than 45 years of age and your health care provider tells you that you are at risk for this type of infection. Your sexual activity has changed since you were last screened, and you are at increased risk for chlamydia or gonorrhea. Ask your health care provider if you are at risk. Ask your health care provider about whether you are at high risk for HIV. Your health care provider may recommend a prescription medicine to help prevent HIV infection. If you choose to take medicine to prevent HIV, you should first get tested for HIV. You should then be tested every 3 months for as long as you are taking the medicine. Follow these instructions at home: Alcohol use Do not drink alcohol if your health care provider tells you not to drink. If you drink alcohol: Limit how much you have to 0-2 drinks a day. Know how much alcohol is in your drink. In the [place] one drink equals one 12 oz bottle of beer (355 mL), one 5 oz glass of wine (148 mL), or one 1 oz glass of hard liquor (44 mL). Lifestyle Do not use any products that contain nicotine or tobacco. These products include cigarettes, chewing tobacco, and vaping devices, such as e-cigarettes. If you need help quitting, ask your health care provider. Do not use street drugs. Do not share needles. Ask your health care provider for help if you need support or information about quitting drugs. General instructions Schedule regular health, dental, and eye exams. Stay current with your vaccines. Tell your health care provider if: You often feel depressed. You have ever been abused or do not feel safe at home. Summary Adopting a healthy lifestyle and getting preventive care are important in promoting health and wellness. Follow your health care provider's instructions about healthy diet, exercising, and getting tested or screened for diseases. Follow your health care provider's instructions on monitoring your cholesterol and blood  pressure. This information is not intended to replace advice given to you by your health care provider. Make sure you discuss any questions you have with your health care provider. Document Revised: 05/09/2020 Document Reviewed: 05/09/2020 Elsevier Patient Education  2024 [employer].

## 2022-10-02 NOTE — Progress Notes (Signed)
Care Coordination  Outreach Note  10/02/2022 Name: Gary Sullivan MRN: [id number] DOB: 1975/03/31   Care Coordination Outreach Attempts: An unsuccessful telephone outreach was attempted today to offer the patient information about available care coordination services.  Follow Up Plan:  Additional outreach attempts will be made to offer the patient care coordination information and services.   Encounter Outcome:  No Answer  Burman Nieves, CCMA Care Coordination Care Guide Direct Dial: [phone]

## 2022-10-02 NOTE — Progress Notes (Signed)
MEDICARE ANNUAL WELLNESS VISIT  10/02/2022  Telephone Visit Disclaimer This Medicare AWV was conducted by telephone due to national recommendations for restrictions regarding the COVID-19 Pandemic (e.g. social distancing).  I verified, using two identifiers, that I am speaking with Gary Sullivan or their authorized healthcare agent. I discussed the limitations, risks, security, and privacy concerns of performing an evaluation and management service by telephone and the potential availability of an in-person appointment in the future. The patient expressed understanding and agreed to proceed.  Location of Patient: Home Location of Provider (nurse):  Provider home  Subjective:    Gary Sullivan is a 47 y.o. adult patient of Christen Butter, NP who had a Medicare Annual Wellness Visit today via telephone. Gary Sullivan is Disabled and lives with their family. Gary Sullivan has one children. Gary Sullivan reports that Gary Sullivan is socially active and does interact with friends/family regularly. Gary Sullivan is moderately physically active and enjoys playing video games online and doing artwork.  Patient Care Team: Christen Butter, NP as PCP - General (Nurse Practitioner) Gabriel Carina, [facility] as Pharmacist (Pharmacist)     10/02/2022   11:02 AM 04/17/2022    2:12 PM 02/27/2021    9:44 AM 09/20/2020   11:58 PM 03/09/2020    3:24 PM 01/11/2020    4:21 PM 08/29/2019    9:48 AM  Advanced Directives  Does Patient Have a Medical Advance Directive? No No No No No No No  Would patient like information on creating a medical advance directive? No - Patient declined  No - Patient declined No - Patient declined No - Patient declined No - Patient declined No - Patient declined    Hospital Utilization Over the Past 12 Months: # of hospitalizations or ER visits: 2 # of surgeries: 2  Review of Systems    Patient reports that her overall health is better compared to last year.  History obtained from chart review and the patient  Patient Reported  Readings (BP, Pulse, CBG, Weight, etc) none Per patient no change in vitals since last visit, unable to obtain new vitals due to telehealth visit  Pain Assessment Pain : No/denies pain     Current Medications & Allergies (verified) Allergies as of 10/02/2022       Reactions   Dust Mite Extract Cough, Hives, Itching, Shortness Of Breath, Swelling   Molds & Smuts Cough, Hives, Itching, Shortness Of Breath   Dog Epithelium (canis Lupus Familiaris) Itching, Other (See Comments)   Other reaction(s): Other (See Comments)   Sulfamethoxazole-trimethoprim Other (See Comments), Diarrhea, Nausea Only, Rash   Other reaction(s): Abdominal Pain   Gramineae Pollens    Fay fever   Grass Pollen(k-o-[id number]-t-swt Vern)    Sulfa Antibiotics    Pain, upset stomach, vomiting   Cat Hair Extract Hives, Itching, Rash        Medication List        Accurate as of October 02, 2022 11:27 AM. If you have any questions, ask your nurse or doctor.          amitriptyline 25 MG tablet Commonly known as: ELAVIL Take 1 tablet (25 mg total) by mouth at bedtime.   ARIPiprazole 5 MG tablet Commonly known as: Abilify Take 1 tablet (5 mg total) by mouth daily.   atenolol 50 MG tablet Commonly known as: TENORMIN Take 1 tablet by mouth daily.   atomoxetine 60 MG capsule Commonly known as: STRATTERA Take 1 capsule (60 mg total) by mouth every morning.   atorvastatin 10 MG  tablet Commonly known as: LIPITOR Take 1 tablet (10 mg total) by mouth daily.   buPROPion 200 MG 12 hr tablet Commonly known as: Wellbutrin SR Take 1 tablet (200 mg total) by mouth 2 (two) times daily.   cholestyramine 4 g packet Commonly known as: QUESTRAN Take 1 packet by mouth daily.   cyclobenzaprine 10 MG tablet Commonly known as: FLEXERIL Take 1 tablet (10 mg total) by mouth 3 (three) times daily as needed for muscle spasms. MILD/MODERATE SPASM   dicyclomine 20 MG tablet Commonly known as: BENTYL Take 1 tablet (20 mg  total) by mouth every 6 (six) hours as needed for spasms.   DULoxetine 60 MG capsule Commonly known as: CYMBALTA Take 1 capsule (60 mg total) by mouth daily.   dutasteride 0.5 MG capsule Commonly known as: AVODART Take 1 capsule (0.5 mg total) by mouth daily.   esomeprazole 40 MG capsule Commonly known as: NexIUM One tab by mouth at dinner time.   estradiol 2 MG tablet Commonly known as: ESTRACE Take 1 tablet (2 mg total) by mouth in the morning and at bedtime.   famotidine 20 MG tablet Commonly known as: PEPCID Take 1 tablet (20 mg total) by mouth 2 (two) times daily.   fenofibrate 160 MG tablet Take 1 tablet by mouth daily.   fluticasone 50 MCG/ACT nasal spray Commonly known as: FLONASE Shake liquid and instill 2 sprays in each nostril daily.   glycopyrrolate 1 MG tablet Commonly known as: ROBINUL Take 2 tablets (2 mg total) by mouth 2 (two) times daily.   ibuprofen 800 MG tablet Commonly known as: ADVIL Take 1 tablet (800 mg total) by mouth every 8 (eight) hours as needed.   lisinopril 5 MG tablet Commonly known as: ZESTRIL Take 1 tablet (5 mg total) by mouth daily.   Omega-3 1000 MG Caps Take by mouth.   Ozempic (0.25 or 0.5 MG/DOSE) 2 MG/3ML Sopn Generic drug: Semaglutide(0.25 or 0.5MG /DOS) Inject 0.5 mg under the skin once weekly.   primidone 50 MG tablet Commonly known as: MYSOLINE Take 1 tablet by mouth twice daily.   progesterone 100 MG capsule Commonly known as: PROMETRIUM Take 1 capsule (100 mg total) by mouth daily.   spironolactone 50 MG tablet Commonly known as: ALDACTONE Take 1 tablet (50 mg total) by mouth 2 (two) times daily.   topiramate 50 MG tablet Commonly known as: TOPAMAX TAKE 1 TO 2 TABLETS BY MOUTH EVERY NIGHT AT BEDTIME.   Vitamin D (Ergocalciferol) 1.25 MG (50000 UNIT) Caps capsule Commonly known as: DRISDOL Take one tablet wkly   VITAMIN D3 PO Take 50,000 Units by mouth once a week.        History (reviewed): Past  Medical History:  Diagnosis Date   ADHD    Alcohol abuse    Allergy    Anxiety    Asthma    Back pain    CFS (chronic fatigue syndrome)    Constipation    Depression    Diabetes mellitus without complication (HCC)    Drug use    Elevated heart rate with elevated blood pressure and diagnosis of hypertension    Environmental allergies    Fatty liver    Fibromyalgia    Fibromyalgia    Gallbladder problem    Gender dysphoria    GERD (gastroesophageal reflux disease)    Has daytime drowsiness    High cholesterol    History of prediabetes 06/04/2019   Hyperhidrosis    Hypertension    IBS (  irritable bowel syndrome)    Pre-diabetes    Sleep apnea    Social anxiety disorder    Swallowing difficulty    Tachycardia    Transgender 01/01/1985   Tremor of both hands    Past Surgical History:  Procedure Laterality Date   BREAST AUGMENTATION WITH IMPLANT   08/28/2022   [facility]   CARPAL TUNNEL RELEASE Right 10/08/2018   Procedure: RIGHT CARPAL TUNNEL RELEASE;  Surgeon: Tarry Kos, MD;  Location: [facility];  Service: Orthopedics;  Laterality: Right;   CHOLECYSTECTOMY     COLONOSCOPY     Around 2014. [facility]   ESOPHAGOGASTRODUODENOSCOPY     Aound 2014 [facility] [place], [place]   labrum repair     MASTOPEXY  05/04/2022   [facility]   NASAL SEPTUM SURGERY     SHOULDER ARTHROSCOPY WITH SUBACROMIAL DECOMPRESSION Left    TONSILLECTOMY     Family History  Problem Relation Age of Onset   High blood pressure Mother    High blood pressure Father    Irritable bowel syndrome Father    Irritable bowel syndrome Sister    Irritable bowel syndrome Sister    High blood pressure Brother    Colon cancer Maternal Aunt    Testicular cancer Paternal Uncle    Pancreatic cancer Maternal Grandfather    Colon cancer Maternal Grandfather    Lung cancer Paternal Grandmother    Diabetes Paternal Grandfather    Esophageal cancer Neg Hx     Social History   Socioeconomic History   Marital status: Divorced    Spouse name: Not on file   Number of children: 1   Years of education: 16   Highest education level: Bachelor's degree (e.g., BA, AB, BS)  Occupational History   Occupation: Unemployed   Occupation: Disabled  Tobacco Use   Smoking status: Former    Types: Cigarettes   Smokeless tobacco: Never  Vaping Use   Vaping status: Former   Quit date: 10/31/2020  Substance and Sexual Activity   Alcohol use: Not Currently   Drug use: Not Currently   Sexual activity: Not Currently    Partners: Female  Other Topics Concern   Not on file  Social History Narrative   Lives with brother, nephew and brother's girlfriend. Gary Sullivan enjoys playing video games online and doing artwork.   Social Determinants of Health   Financial Resource Strain: Low Risk  (10/02/2022)   Overall Financial Resource Strain (CARDIA)    Difficulty of Paying Living Expenses: Not hard at all  Food Insecurity: Food Insecurity Present (10/02/2022)   Hunger Vital Sign    Worried About Running Out of Food in the Last Year: Often true    Ran Out of Food in the Last Year: Sometimes true  Transportation Needs: No Transportation Needs (10/02/2022)   PRAPARE - Administrator, Civil Service (Medical): No    Lack of Transportation (Non-Medical): No  Physical Activity: Inactive (10/02/2022)   Exercise Vital Sign    Days of Exercise per Week: 0 days    Minutes of Exercise per Session: 0 min  Stress: Stress Concern Present (10/02/2022)   [employer] of Occupational Health - Occupational Stress Questionnaire    Feeling of Stress : To some extent  Social Connections: Socially Isolated (10/02/2022)   Social Connection and Isolation Panel [NHANES]    Frequency of Communication with Friends and Family: Once a week    Frequency of Social Gatherings  with Friends and Family: More than three times a week    Attends Religious Services: Never    Active  Member of Clubs or Organizations: No    Attends Banker Meetings: Never    Marital Status: Divorced    Activities of Daily Living    10/02/2022   11:14 AM  In your present state of health, do you have any difficulty performing the following activities:  Hearing? 0  Vision? 0  Difficulty concentrating or making decisions? 1  Walking or climbing stairs? 0  Dressing or bathing? 0  Doing errands, shopping? 0  Preparing Food and eating ? N  Using the Toilet? N  In the past six months, have you accidently leaked urine? N  Do you have problems with loss of bowel control? N  Managing your Medications? Y  Managing your Finances? N  Housekeeping or managing your Housekeeping? N    Patient Education/ Literacy How often do you need to have someone help you when you read instructions, pamphlets, or other written materials from your doctor or pharmacy?: 1 - Never What is the last grade level you completed in school?: bachelor's degree  Exercise    Diet Patient reports consuming  1-2  meals a day and 0 snack(s) a day Patient reports that her primary diet is: Regular Patient reports that Gary Sullivan does not have regular access to food.   Depression Screen    10/02/2022   11:02 AM 05/17/2022   10:15 AM 04/17/2022    2:12 PM 04/12/2021   11:15 AM 02/27/2021    9:45 AM 02/22/2021    9:47 AM 01/10/2021   11:34 AM  PHQ 2/9 Scores  PHQ - 2 Score 6 2 1 4 4 6 5   PHQ- 9 Score 19 7  14 17 22 14      Fall Risk    10/02/2022   11:02 AM 05/17/2022   10:14 AM 04/17/2022    2:12 PM 04/12/2021   11:15 AM 02/27/2021    9:44 AM  Fall Risk   Falls in the past year? 1 1 1  0 0  Number falls in past yr: 0 1 1 0 0  Injury with Fall? 1 1 0 0 0  Risk for fall due to : No Fall Risks History of fall(s) History of fall(s) No Fall Risks No Fall Risks  Follow up Falls evaluation completed Falls evaluation completed Falls evaluation completed Falls evaluation completed Falls evaluation completed      Objective:  Kennis Wissmann seemed alert and oriented and Gary Sullivan participated appropriately during our telephone visit.  Blood Pressure Weight BMI  BP Readings from Last 3 Encounters:  09/28/22 106/70  05/17/22 90/60  04/17/22 105/73   Wt Readings from Last 3 Encounters:  09/28/22 (!) 309 lb 14.4 oz (140.6 kg)  05/17/22 297 lb 12.8 oz (135.1 kg)  04/17/22 296 lb 4.8 oz (134.4 kg)   BMI Readings from Last 1 Encounters:  09/28/22 47.12 kg/m    *Unable to obtain current vital signs, weight, and BMI due to telephone visit type  Hearing/Vision  Kamonte did not seem to have difficulty with hearing/understanding during the telephone conversation Reports that Gary Sullivan has had a formal eye exam by an eye care professional within the past year Reports that Gary Sullivan has not had a formal hearing evaluation within the past year *Unable to fully assess hearing and vision during telephone visit type  Cognitive Function:    10/02/2022   11:17 AM 02/27/2021   10:06  AM 01/11/2020    4:29 PM  6CIT Screen  What Year? 0 points 0 points 0 points  What month? 0 points 0 points 0 points  What time? 0 points 0 points 0 points  Count back from 20 0 points 0 points 0 points  Months in reverse 0 points 0 points 0 points  Repeat phrase 2 points 6 points 0 points  Total Score 2 points 6 points 0 points   (Normal:0-7, Significant for Dysfunction: >8)  Normal Cognitive Function Screening: Yes   Immunization & Health Maintenance Record Immunization History  Administered Date(s) Administered   Influenza, Seasonal, Injecte, Preservative Fre 09/28/2022   Moderna Sars-Covid-2 Vaccination 08/11/2019, 09/05/2019   Pfizer(Comirnaty)Fall Seasonal Vaccine 12 years and older 09/28/2022   Tdap 04/12/2021    Health Maintenance  Topic Date Due   Colonoscopy  05/17/2023 (Originally 02/11/2022)   Hepatitis C Screening  09/28/2023 (Originally 02/12/1995)   HIV Screening  09/28/2023 (Originally 02/12/1992)   Medicare Annual  Wellness (AWV)  10/02/2023   DTaP/Tdap/Td (2 - Td or Tdap) 04/13/2031   INFLUENZA VACCINE  Completed   COVID-19 Vaccine  Completed   HPV VACCINES  Aged Out       Assessment  This is a routine wellness examination for [employer].  Health Maintenance: Due or Overdue There are no preventive care reminders to display for this patient.   Krishang Reading needs a referral for Community Assistance: Care Management:   yes Social Work:    no Prescription Assistance:  no Nutrition/Diabetes Education:  no   Plan:  Personalized Goals  Goals Addressed               This Visit's Progress     Patient Stated (pt-stated)        Patient stated that Gary Sullivan would like to loose weight.       Personalized Health Maintenance & Screening Recommendations  Colorectal cancer screening  Lung Cancer Screening Recommended: no (Low Dose CT Chest recommended if Age 19-80 years, 20 pack-year currently smoking OR have quit w/in past 15 years) Hepatitis C Screening recommended: yes HIV Screening recommended: yes  Advanced Directives: Written information was not prepared per patient's request.  Referrals & Orders Orders Placed This Encounter  Procedures   AMB Referral to Community Care Coordinaton (ACO Patients)   Ambulatory referral to Gastroenterology (for Colonoscopy)    Follow-up Plan Follow-up with Christen Butter, NP as planned Medicare wellness visit in one year.  Patient will access AVS on my chart.    I have personally reviewed and noted the following in the patient's chart:   Medical and social history Use of alcohol, tobacco or illicit drugs  Current medications and supplements Functional ability and status Nutritional status Physical activity Advanced directives List of other physicians Hospitalizations, surgeries, and ER visits in previous 12 months Vitals Screenings to include cognitive, depression, and falls Referrals and appointments  In addition, I have reviewed  and discussed with Gary Sullivan certain preventive protocols, quality metrics, and best practice recommendations. A written personalized care plan for preventive services as well as general preventive health recommendations is available and can be mailed to the patient at her request.      Modesto Charon, RN BSN  10/02/2022

## 2022-10-04 NOTE — Progress Notes (Signed)
Care Coordination  Outreach Note  10/04/2022 Name: Czar Tango MRN: [id number] DOB: 08/24/1975   Care Coordination Outreach Attempts: A second unsuccessful outreach was attempted today to offer the patient with information about available care coordination services.  Follow Up Plan:  Additional outreach attempts will be made to offer the patient care coordination information and services.   Encounter Outcome:  No Answer  Burman Nieves, CCMA Care Coordination Care Guide Direct Dial: [phone]

## 2022-10-05 NOTE — Progress Notes (Signed)
Care Coordination   Note   10/05/2022 Name: Gary Sullivan MRN: [id number] DOB: 03-Jun-1975  Gary Sullivan is a 47 y.o. year old adult who sees Christen Butter, NP for primary care. I reached out to [name] by phone today to offer care coordination services.  Ms. Battenfield was given information about Care Coordination services today including:   The Care Coordination services include support from the care team which includes your Nurse Coordinator, Clinical Social Worker, or Pharmacist.  The Care Coordination team is here to help remove barriers to the health concerns and goals most important to you. Care Coordination services are voluntary, and the patient may decline or stop services at any time by request to their care team member.   Care Coordination Consent Status: Patient agreed to services and verbal consent obtained.   Follow up plan:  Telephone appointment with care coordination team member scheduled for:  10/12/2022  Encounter Outcome:  Patient Scheduled from referral   Burman Nieves, [facility] Care Coordination Care Guide Direct Dial: [phone]

## 2022-10-08 DIAGNOSIS — F4310 Post-traumatic stress disorder, unspecified: Secondary | ICD-10-CM | POA: Diagnosis not present

## 2022-10-12 ENCOUNTER — Ambulatory Visit: Payer: Self-pay | Admitting: Licensed Clinical Social Worker

## 2022-10-12 NOTE — Patient Outreach (Signed)
Care Coordination   Initial Visit Note   10/12/2022 Name: Rayniel Luney MRN: [id number] DOB: 13-Oct-1975  Khalifah Macdougall is a 47 y.o. year old adult who sees Christen Butter, NP for primary care. I spoke with  [name] by phone today.  What matters to the patients health and wellness today?  Food resources     Goals Addressed             This Visit's Progress    Car Coordination Activities       Care Coordination Interventions: Patient stated that she does have some issues with obtaining food at times and that she does have Medicare Healthy Blue that she does receive $290 in food benefits. SW will mail a Materials engineer to the patient for [employer]. Patient stated that she does have transportation Patient stated that she does not have any current issue with housing or utilities.         SDOH assessments and interventions completed:  Yes  SDOH Interventions Today    Flowsheet Row Most Recent Value  SDOH Interventions   Food Insecurity Interventions Other (Comment)  [Sw will mail a food pantry resouce list]  Housing Interventions Other (Comment)  [Patient lives with brother and another roommate]  Transportation Interventions Other (Comment)  [Patient stated that they have transportation]  Utilities Interventions Intervention Not Indicated  [Patient stated no issues with the utility bills]        Care Coordination Interventions:  Yes, provided  Interventions Today    Flowsheet Row Most Recent Value  General Interventions   General Interventions Discussed/Reviewed General Interventions Discussed, [employer]  [SW will mail  list for food reosurces for [place]]  Education Interventions   Education Provided Provided [employer]  [Food resources will be mailed for [place]]        Follow up plan: Follow up call scheduled for 10/26/2022 at 2:00pm    Encounter Outcome:  Patient Visit Completed   Jeanie Cooks,  PhD [facility], [facility] Social Worker Direct Dial: [phone]  Fax: [phone]

## 2022-10-12 NOTE — Patient Instructions (Signed)
Visit Information  Thank you for taking time to visit with me today. Please don't hesitate to contact me if I can be of assistance to you.   Following are the goals we discussed today:   Goals Addressed             This Visit's Progress    Car Coordination Activities       Care Coordination Interventions: Patient stated that she does have some issues with obtaining food at times and that she does have Medicare Healthy Blue that she does receive $290 in food benefits. SW will mail a Materials engineer to the patient for [employer]. Patient stated that she does have transportation Patient stated that she does not have any current issue with housing or utilities.         Our next appointment is by telephone on 10/26/2022 at 2:00pm  Please call the care guide team at [phone] if you need to cancel or reschedule your appointment.   If you are experiencing a Mental Health or Behavioral Health Crisis or need someone to talk to, please call the Suicide and Crisis Lifeline: 988 call the [place] National Suicide Prevention Lifeline: 478-697-0864 or TTY: (646)416-2578 TTY (712)263-7921) to talk to a trained counselor call 1-800-273-TALK (toll free, 24 hour hotline) call 911  Patient verbalizes understanding of instructions and care plan provided today and agrees to view in MyChart. Active MyChart status and patient understanding of how to access instructions and care plan via MyChart confirmed with patient.     Jeanie Cooks, PhD [facility], [facility] Social Worker Direct Dial: [phone]  Fax: [phone]

## 2022-10-21 ENCOUNTER — Other Ambulatory Visit: Payer: Self-pay | Admitting: Medical-Surgical

## 2022-10-24 ENCOUNTER — Other Ambulatory Visit: Payer: Self-pay | Admitting: Medical-Surgical

## 2022-10-25 DIAGNOSIS — F4010 Social phobia, unspecified: Secondary | ICD-10-CM | POA: Diagnosis not present

## 2022-10-25 DIAGNOSIS — F39 Unspecified mood [affective] disorder: Secondary | ICD-10-CM | POA: Diagnosis not present

## 2022-10-25 DIAGNOSIS — F988 Other specified behavioral and emotional disorders with onset usually occurring in childhood and adolescence: Secondary | ICD-10-CM | POA: Diagnosis not present

## 2022-10-25 DIAGNOSIS — F4310 Post-traumatic stress disorder, unspecified: Secondary | ICD-10-CM | POA: Diagnosis not present

## 2022-10-26 ENCOUNTER — Ambulatory Visit: Payer: Medicaid Other | Admitting: Licensed Clinical Social Worker

## 2022-10-26 ENCOUNTER — Other Ambulatory Visit: Payer: Self-pay | Admitting: Medical-Surgical

## 2022-10-26 DIAGNOSIS — J0100 Acute maxillary sinusitis, unspecified: Secondary | ICD-10-CM

## 2022-10-26 NOTE — Patient Outreach (Signed)
Care Coordination   10/26/2022 Name: Gary Sullivan MRN: [id number] DOB: 01-Mar-1975   Care Coordination Outreach Attempts:  An unsuccessful telephone outreach was attempted for a scheduled appointment today.  Follow Up Plan:  Additional outreach attempts will be made to offer the patient care coordination information and services.   Encounter Outcome:  No Answer   Care Coordination Interventions:  No, not indicated    SIG Jeanie Cooks, PhD [facility], [facility] Social Worker Direct Dial: [phone]  Fax: [phone]

## 2022-11-02 ENCOUNTER — Ambulatory Visit: Payer: Self-pay | Admitting: Licensed Clinical Social Worker

## 2022-11-02 NOTE — Patient Outreach (Signed)
  Care Coordination   Follow Up Visit Note   11/02/2022 Name: Gary Sullivan MRN: [id number] DOB: 02-03-1975  Gary Sullivan is a 47 y.o. year old adult who sees Gary Butter, NP for primary care. I spoke with  Gary Sullivan by phone today.  What matters to the patients health and wellness today?  Food resource, patient received the list in the mail and will utilize the resources    Goals Addressed             This Visit's Progress    COMPLETED: Car Coordination Activities       Care Coordination Interventions: Patient stated that she does have some issues with obtaining food at times and that she does have Medicare Healthy Blue that she does receive $290 in food benefits. SW will mail a Materials engineer to the patient for [employer]. Patient stated that she does have transportation Patient stated that she does not have any current issue with housing or utilities.         SDOH assessments and interventions completed:  Yes  SDOH Interventions Today    Flowsheet Row Most Recent Value  SDOH Interventions   Food Insecurity Interventions Intervention Not Indicated  [Patient received the information]  Housing Interventions Intervention Not Indicated  Transportation Interventions Intervention Not Indicated  Utilities Interventions Intervention Not Indicated        Care Coordination Interventions:  Yes, provided  Interventions Today    Flowsheet Row Most Recent Value  General Interventions   General Interventions Discussed/Reviewed General Interventions Discussed, General Interventions Reviewed  [Patient received the food resource for food pantries in the mail and will utilize the list]        Follow up plan: No further intervention required.   Encounter Outcome:  Patient Visit Completed   Jeanie Cooks, PhD [facility], [facility] Social Worker Direct Dial: [phone]  Fax: [phone]

## 2022-11-02 NOTE — Patient Instructions (Signed)
Visit Information  Thank you for taking time to visit with me today. Please don't hesitate to contact me if I can be of assistance to you.   Following are the goals we discussed today:   Goals Addressed             This Visit's Progress    COMPLETED: Car Coordination Activities       Care Coordination Interventions: Patient stated that she does have some issues with obtaining food at times and that she does have Medicare Healthy Blue that she does receive $290 in food benefits. SW will mail a Materials engineer to the patient for [employer]. Patient stated that she does have transportation Patient stated that she does not have any current issue with housing or utilities.           Please call the care guide team at [phone] if you need to cancel or reschedule your appointment.   If you are experiencing a Mental Health or Behavioral Health Crisis or need someone to talk to, please call the Suicide and Crisis Lifeline: 988 go to [facility] [address] [place] [id number]) call 911  Patient verbalizes understanding of instructions and care plan provided today and agrees to view in MyChart. Active MyChart status and patient understanding of how to access instructions and care plan via MyChart confirmed with patient.     Jeanie Cooks, PhD [facility], [facility] Social Worker Direct Dial: [phone]  Fax: [phone]

## 2022-11-05 DIAGNOSIS — F4310 Post-traumatic stress disorder, unspecified: Secondary | ICD-10-CM | POA: Diagnosis not present

## 2022-11-21 DIAGNOSIS — F4310 Post-traumatic stress disorder, unspecified: Secondary | ICD-10-CM | POA: Diagnosis not present

## 2022-12-03 DIAGNOSIS — F4312 Post-traumatic stress disorder, chronic: Secondary | ICD-10-CM | POA: Diagnosis not present

## 2022-12-06 ENCOUNTER — Other Ambulatory Visit: Payer: Self-pay | Admitting: Medical-Surgical

## 2022-12-06 DIAGNOSIS — J0100 Acute maxillary sinusitis, unspecified: Secondary | ICD-10-CM

## 2022-12-20 DIAGNOSIS — F4312 Post-traumatic stress disorder, chronic: Secondary | ICD-10-CM | POA: Diagnosis not present

## 2022-12-31 DIAGNOSIS — F4312 Post-traumatic stress disorder, chronic: Secondary | ICD-10-CM | POA: Diagnosis not present

## 2023-01-04 ENCOUNTER — Other Ambulatory Visit: Payer: Self-pay | Admitting: Medical-Surgical

## 2023-01-04 DIAGNOSIS — J0100 Acute maxillary sinusitis, unspecified: Secondary | ICD-10-CM

## 2023-01-14 DIAGNOSIS — Q839 Congenital malformation of breast, unspecified: Secondary | ICD-10-CM | POA: Diagnosis not present

## 2023-01-14 DIAGNOSIS — F649 Gender identity disorder, unspecified: Secondary | ICD-10-CM | POA: Diagnosis not present

## 2023-01-14 DIAGNOSIS — F4312 Post-traumatic stress disorder, chronic: Secondary | ICD-10-CM | POA: Diagnosis not present

## 2023-01-28 DIAGNOSIS — F4312 Post-traumatic stress disorder, chronic: Secondary | ICD-10-CM | POA: Diagnosis not present

## 2023-02-11 DIAGNOSIS — F4312 Post-traumatic stress disorder, chronic: Secondary | ICD-10-CM | POA: Diagnosis not present

## 2023-02-23 ENCOUNTER — Other Ambulatory Visit: Payer: Self-pay | Admitting: Medical-Surgical

## 2023-03-23 ENCOUNTER — Other Ambulatory Visit: Payer: Self-pay | Admitting: Medical-Surgical

## 2023-04-01 ENCOUNTER — Other Ambulatory Visit: Payer: Self-pay | Admitting: Medical-Surgical

## 2023-04-03 ENCOUNTER — Other Ambulatory Visit: Payer: Self-pay

## 2023-04-03 MED ORDER — FAMOTIDINE 20 MG PO TABS: 20.0000 mg | ORAL_TABLET | Freq: Two times a day (BID) | ORAL | 0 refills | Status: DC

## 2023-04-03 MED ORDER — ATOMOXETINE HCL 60 MG PO CAPS: 60.0000 mg | ORAL_CAPSULE | Freq: Every morning | ORAL | 0 refills | Status: DC

## 2023-04-30 DIAGNOSIS — F4312 Post-traumatic stress disorder, chronic: Secondary | ICD-10-CM | POA: Diagnosis not present

## 2023-06-18 NOTE — Progress Notes (Signed)
 This patient is appearing on a report for being at risk of failing the adherence measure for cholesterol (statin) and hypertension (ACEi/ARB) medications this calendar year.   Medication: lisinopril  5mg  and atorvastatin  10mg  Last fill date: 5/28 for lisinopril  and 6/2 for atorvastatin - both filled for 30 day supply  Insurance report was not up to date. No action needed at this time.   Linn Rich, PharmD, DPLA

## 2023-06-30 ENCOUNTER — Other Ambulatory Visit: Payer: Self-pay | Admitting: Medical-Surgical

## 2023-08-29 ENCOUNTER — Other Ambulatory Visit: Payer: Self-pay | Admitting: Medical-Surgical

## 2023-08-29 DIAGNOSIS — E7800 Pure hypercholesterolemia, unspecified: Secondary | ICD-10-CM

## 2023-08-29 DIAGNOSIS — Z789 Other specified health status: Secondary | ICD-10-CM

## 2023-08-30 ENCOUNTER — Other Ambulatory Visit: Payer: Self-pay

## 2023-08-30 MED ORDER — ESOMEPRAZOLE MAGNESIUM 40 MG PO CPDR
DELAYED_RELEASE_CAPSULE | ORAL | 0 refills | Status: AC
Start: 2023-08-30 — End: ?

## 2023-08-30 MED ORDER — ESOMEPRAZOLE MAGNESIUM 40 MG PO CPDR: DELAYED_RELEASE_CAPSULE | ORAL | 0 refills | Status: DC

## 2023-10-03 ENCOUNTER — Other Ambulatory Visit: Payer: Self-pay | Admitting: Medical-Surgical

## 2023-10-07 ENCOUNTER — Emergency Department (HOSPITAL_BASED_OUTPATIENT_CLINIC_OR_DEPARTMENT_OTHER)
Admission: EM | Admit: 2023-10-07 | Discharge: 2023-10-07 | Disposition: A | Discharge status: Home / Self Care | Attending: Emergency Medicine | Admitting: Emergency Medicine

## 2023-10-07 ENCOUNTER — Ambulatory Visit: Payer: Self-pay

## 2023-10-07 ENCOUNTER — Other Ambulatory Visit: Payer: Self-pay

## 2023-10-07 ENCOUNTER — Emergency Department (HOSPITAL_BASED_OUTPATIENT_CLINIC_OR_DEPARTMENT_OTHER)

## 2023-10-07 ENCOUNTER — Encounter (HOSPITAL_BASED_OUTPATIENT_CLINIC_OR_DEPARTMENT_OTHER): Payer: Self-pay | Admitting: Emergency Medicine

## 2023-10-07 DIAGNOSIS — M5440 Lumbago with sciatica, unspecified side: Secondary | ICD-10-CM | POA: Insufficient documentation

## 2023-10-07 DIAGNOSIS — M549 Dorsalgia, unspecified: Secondary | ICD-10-CM | POA: Diagnosis present

## 2023-10-07 MED ORDER — CYCLOBENZAPRINE HCL 10 MG PO TABS: 5.0000 mg | ORAL_TABLET | Freq: Every day | ORAL | 0 refills | Status: AC

## 2023-10-07 MED ORDER — PREDNISONE 10 MG PO TABS
60.0000 mg | ORAL_TABLET | Freq: Once | ORAL | Status: AC
  Administered 2023-10-07: 60 mg via ORAL
  Filled 2023-10-07: qty 1

## 2023-10-07 MED ORDER — IBUPROFEN 800 MG PO TABS
800.0000 mg | ORAL_TABLET | Freq: Once | ORAL | Status: AC
  Administered 2023-10-07: 800 mg via ORAL
  Filled 2023-10-07: qty 1

## 2023-10-07 MED ORDER — PREDNISONE 10 MG PO TABS: ORAL_TABLET | ORAL | 0 refills | Status: AC

## 2023-10-07 MED ORDER — ACETAMINOPHEN 500 MG PO TABS
1000.0000 mg | ORAL_TABLET | Freq: Once | ORAL | Status: AC
  Administered 2023-10-07: 1000 mg via ORAL
  Filled 2023-10-07: qty 2
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
## 2023-10-08 ENCOUNTER — Encounter

## 2023-11-06 ENCOUNTER — Other Ambulatory Visit: Payer: Self-pay | Admitting: Medical-Surgical

## 2023-11-27 ENCOUNTER — Other Ambulatory Visit: Payer: Self-pay | Admitting: Medical-Surgical

## 2023-11-27 DIAGNOSIS — Z789 Other specified health status: Secondary | ICD-10-CM

## 2023-11-27 DIAGNOSIS — E7800 Pure hypercholesterolemia, unspecified: Secondary | ICD-10-CM

## 2023-12-02 ENCOUNTER — Other Ambulatory Visit: Payer: Self-pay | Admitting: Medical-Surgical

## 2023-12-02 DIAGNOSIS — Z789 Other specified health status: Secondary | ICD-10-CM

## 2023-12-27 ENCOUNTER — Other Ambulatory Visit: Payer: Self-pay | Admitting: Medical-Surgical

## 2023-12-27 NOTE — Telephone Encounter (Signed)
 STRATTERA  Last OV: 09/28/22 Next OV: no appointment  Last RF: 10/07/2023
